# Patient Record
Sex: Female | Born: 1975 | Race: Asian | Hispanic: No | Marital: Married
Health system: Midwestern US, Community
[De-identification: ages and names within clinical notes are randomized; demographics above are authoritative.]

## PROBLEM LIST (undated history)

## (undated) ENCOUNTER — Emergency Department (HOSPITAL_COMMUNITY): Admission: EM | Payer: BLUE CROSS/BLUE SHIELD | Source: Home / Self Care

## (undated) DIAGNOSIS — A159 Respiratory tuberculosis unspecified: Secondary | ICD-10-CM

## (undated) DIAGNOSIS — I10 Essential (primary) hypertension: Secondary | ICD-10-CM

## (undated) DIAGNOSIS — E782 Mixed hyperlipidemia: Secondary | ICD-10-CM

## (undated) DIAGNOSIS — E1142 Type 2 diabetes mellitus with diabetic polyneuropathy: Principal | ICD-10-CM

## (undated) DIAGNOSIS — M5412 Radiculopathy, cervical region: Secondary | ICD-10-CM

## (undated) DIAGNOSIS — M542 Cervicalgia: Secondary | ICD-10-CM

## (undated) DIAGNOSIS — E119 Type 2 diabetes mellitus without complications: Principal | ICD-10-CM

## (undated) DIAGNOSIS — Z0000 Encounter for general adult medical examination without abnormal findings: Principal | ICD-10-CM

## (undated) DIAGNOSIS — N83202 Unspecified ovarian cyst, left side: Secondary | ICD-10-CM

## (undated) DIAGNOSIS — M4802 Spinal stenosis, cervical region: Secondary | ICD-10-CM

## (undated) DIAGNOSIS — R7309 Other abnormal glucose: Secondary | ICD-10-CM

## (undated) DIAGNOSIS — M25512 Pain in left shoulder: Secondary | ICD-10-CM

## (undated) DIAGNOSIS — Z1231 Encounter for screening mammogram for malignant neoplasm of breast: Secondary | ICD-10-CM

## (undated) DIAGNOSIS — M25511 Pain in right shoulder: Secondary | ICD-10-CM

## (undated) DIAGNOSIS — E875 Hyperkalemia: Principal | ICD-10-CM

---

## 2010-10-19 ENCOUNTER — Other Ambulatory Visit: Payer: Self-pay | Admitting: Infectious Diseases

## 2010-10-19 ENCOUNTER — Ambulatory Visit
Admission: RE | Admit: 2010-10-19 | Discharge: 2010-10-19 | Disposition: A | Discharge status: Home / Self Care | Payer: No Typology Code available for payment source | Source: Ambulatory Visit | Attending: Infectious Diseases | Admitting: Infectious Diseases

## 2010-10-19 DIAGNOSIS — R7611 Nonspecific reaction to tuberculin skin test without active tuberculosis: Secondary | ICD-10-CM

## 2011-11-26 ENCOUNTER — Emergency Department (INDEPENDENT_AMBULATORY_CARE_PROVIDER_SITE_OTHER): Payer: Medicaid Other

## 2011-11-26 ENCOUNTER — Encounter (HOSPITAL_COMMUNITY): Payer: Self-pay | Admitting: Emergency Medicine

## 2011-11-26 ENCOUNTER — Emergency Department (HOSPITAL_COMMUNITY)
Admission: EM | Admit: 2011-11-26 | Discharge: 2011-11-26 | Disposition: A | Discharge status: Home / Self Care | Payer: Medicaid Other | Source: Home / Self Care | Attending: Emergency Medicine | Admitting: Emergency Medicine

## 2011-11-26 DIAGNOSIS — S60459A Superficial foreign body of unspecified finger, initial encounter: Secondary | ICD-10-CM

## 2011-11-26 NOTE — ED Notes (Signed)
Pt here with c/o right hand index finger scabbed blister with pain after cleaning fish x 2 weeks ago.swelling noted but denies numb/tingling according to Health and safety inspector for [employer].no drainage or redness seen

## 2011-11-26 NOTE — Discharge Instructions (Signed)
Go to Dr. Carlos Levering office now. He is expecting you. This will require surgical removal which can do in his office. He will put you in the right antibiotics and pain medicine if necessary.,

## 2011-11-26 NOTE — ED Provider Notes (Signed)
History     CSN: [id number]  Arrival date & time 11/26/11  1116   First MD Initiated Contact with Patient 11/26/11 1155      Chief Complaint  Patient presents with  . Rash  . Hand Pain    (Consider location/radiation/quality/duration/timing/severity/associated sxs/prior treatment) HPI Comments: Patient is a right-handed female who reports cleaning fish, and a fishbone stuck her in the right index PIP. She reports pain, swelling at the joint, and has been squeezing on it repetitively and expressing some yellowish drainage. .No nausea, vomiting, redness extending up the finger. No limitation of motion. No pain with motion, paresthesias. She is not a smoker, and is not a diabetic. She has no previous history of injury to this hand.  Patient is a 36 y.o. female presenting with hand pain. The history is provided by the patient. The history is limited by a language barrier. 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  Hand Pain This is a new problem. The current episode started more than 1 week ago. The problem occurs constantly. The problem has not changed since onset.The symptoms are aggravated by nothing. The symptoms are relieved by nothing. She has tried nothing for the symptoms. The treatment provided no relief.    History reviewed. No pertinent past medical history.  History reviewed. No pertinent past surgical history.  History reviewed. No pertinent family history.  History  Substance Use Topics  . Smoking status: Never Smoker   . Smokeless tobacco: Not on file  . Alcohol Use: No    OB History    Grav Para Term Preterm Abortions TAB SAB Ect Mult Living                  Review of Systems  Constitutional: Negative for fever.  Gastrointestinal: Negative for nausea and vomiting.  Musculoskeletal: Positive for joint swelling.  Neurological: Negative for weakness and numbness.    Allergies  Review of patient's allergies indicates no known allergies.  Home  Medications  No current outpatient prescriptions on file.  BP 134/88  Pulse 71  Temp(Src) 98.6 F (37 C) (Oral)  Resp 16  SpO2 100%  LMP 11/20/2011  Physical Exam  Nursing note and vitals reviewed. Constitutional: She is oriented to person, place, and time. She appears well-developed and well-nourished. No distress.  HENT:  Head: Normocephalic and atraumatic.  Eyes: Conjunctivae and EOM are normal.  Neck: Normal range of motion.  Cardiovascular: Normal rate.   Pulmonary/Chest: Effort normal.  Abdominal: She exhibits no distension.  Musculoskeletal: Normal range of motion.       Hands:       Nontender Callus at right index DIP. No erythema, expressible purulent drainage. Patient able to flex/extend finger with full-strength. FDS/FDP 5/5 compared to other fingers. Sensation 2 point discrimination intact at 5 mm. Wrist of hand is within normal limits.  Neurological: She is alert and oriented to person, place, and time.  Skin: Skin is warm and dry.  Psychiatric: She has a normal mood and affect. Her behavior is normal. Judgment and thought content normal.    ED Course  Procedures (including critical care time)  Labs Reviewed - No data to display Dg Finger Index Right  11/26/2011  *RADIOLOGY REPORT*  Clinical Data: Fish bone stuck into right index finger while eating.  RIGHT INDEX FINGER 2+V  Comparison: None.  Findings: A fish bone is present within the soft tissues of the index finger dorsal to the middle phalanx.  No fracture.  IMPRESSION: Fish bone  within the soft tissues of the index finger as described.  Original Report Authenticated By: Donavan Burnet, M.D.     1. Foreign body in skin of finger    x-ray reviewed by myself. Foreign body present. see radiologist's note.   MDM  Obtaining x-ray of index finger to rule out retained foreign body.   Discussed with Dr. Amanda Pea. This will require I&D. I will send her over to his office now, as he is in clinic. He will see her in  the office and do the procedure today. He'll send her home with appropriate antibiotics and pain medications as needed. Discussed this with the patient using a language line. She agrees with plan.  Luiz Blare, MD 11/26/11 1440

## 2014-08-12 ENCOUNTER — Ambulatory Visit (INDEPENDENT_AMBULATORY_CARE_PROVIDER_SITE_OTHER): Payer: BLUE CROSS/BLUE SHIELD | Admitting: Obstetrics and Gynecology

## 2014-08-12 ENCOUNTER — Encounter: Payer: Self-pay | Admitting: Obstetrics and Gynecology

## 2014-08-12 VITALS — BP 144/89 | HR 77 | Ht 60.0 in | Wt 142.8 lb

## 2014-08-12 DIAGNOSIS — Z3009 Encounter for other general counseling and advice on contraception: Secondary | ICD-10-CM | POA: Diagnosis not present

## 2014-08-12 NOTE — Progress Notes (Signed)
Patient ID: Toni Jordan, female   DOB: 12/01/1975, 39 y.o.   MRN: [id number] 39 yo G4P4 presenting today requesting permanent sterilization. Patient was previously on depo-provera but is ready to move forward with a more permanent procedure  History reviewed. No pertinent past medical history. History reviewed. No pertinent past surgical history. History reviewed. No pertinent family history. History  Substance Use Topics  . Smoking status: Never Smoker   . Smokeless tobacco: Not on file  . Alcohol Use: No   GENERAL: Well-developed, well-nourished female in no acute distress.  ABDOMEN: Soft, nontender, nondistended. No organomegaly. EXTREMITIES: No cyanosis, clubbing, or edema, 2+ distal pulses.  A/P 39 yo with a desire of permanent sterilization - Risks, benefits and alternatives were explained including but not limited to risks of bleeding, infection and damage to adjacent organs. Patient verbalized understanding and all questions were answered. - The patient signed the Medicaid consent form today and will be scheduled for a laparoscopic bilateral salpingectomy 30 days from now. - All questions were answered

## 2014-09-23 NOTE — H&P (Signed)
Toni Jordan is a 39 y.o. female 264P4 with a desire for permanent sterilization who is here for a scheduled laparoscopic salpingectomy.  Pertinent Gynecological History: Menses: flow is light and regular every month without intermenstrual spotting Bleeding: normal Contraception: Depo-Provera injections DES exposure: denies Blood transfusions: none Sexually transmitted diseases: no past history Previous GYN Procedures: none  OB History: G4, P4   Menstrual History:  No LMP recorded.    Past Medical History  Diagnosis Date  . Vaginal delivery 2000,2005,1998,1995    Past Surgical History  Procedure Laterality Date  . No past surgeries      History reviewed. No pertinent family history.  Social History:  reports that she has never smoked. She does not have any smokeless tobacco history on file. She reports that she does not drink alcohol or use illicit drugs.  Allergies: No Known Allergies  No prescriptions prior to admission    Review of Systems  All other systems reviewed and are negative.   Blood pressure 132/85, pulse 66, temperature 97.5 F (36.4 C), resp. rate 18, height 4\' 10"  (1.473 m), weight 142 lb (64.411 kg), SpO2 99 %. Physical Exam  GENERAL: Well-developed, well-nourished female in no acute distress.  HEENT: Normocephalic, atraumatic. Sclerae anicteric.  NECK: Supple. Normal thyroid.  LUNGS: Clear to auscultation bilaterally.  HEART: Regular rate and rhythm. ABDOMEN: Soft, nontender, nondistended. No organomegaly. PELVIC: Deferred to OR EXTREMITIES: No cyanosis, clubbing, or edema, 2+ distal pulses.   Results for orders placed or performed during the hospital encounter of 09/24/14 (from the past 24 hour(s))  CBC     Status: Abnormal   Collection Time: 09/24/14 10:40 AM  Result Value Ref Range   WBC 11.9 (H) 4.0 - 10.5 K/uL   RBC 4.87 3.87 - 5.11 MIL/uL   Hemoglobin 14.2 12.0 - 15.0 g/dL   HCT 16.140.8 09.636.0 - 04.546.0 %   MCV 83.8 78.0 - 100.0 fL   MCH 29.2  26.0 - 34.0 pg   MCHC 34.8 30.0 - 36.0 g/dL   RDW 40.913.1 81.111.5 - 91.415.5 %   Platelets 288 150 - 400 K/uL    No results found.  Assessment/Plan: 39 yo G4P4 here for salpingectomy due to her desire for permanent sterilization - Risks, benefits and alternatives were explained including but not limited to risks of bleeding, infection and damage to adjacent organs. Patient verbalized understanding and all questions were answered. Consent signed for a laparoscopic bilateral salpingectomy  Brooklee Michelin 09/24/2014, 11:16 AM

## 2014-09-24 ENCOUNTER — Ambulatory Visit (HOSPITAL_COMMUNITY): Payer: BLUE CROSS/BLUE SHIELD | Admitting: Certified Registered Nurse Anesthetist

## 2014-09-24 ENCOUNTER — Encounter (HOSPITAL_COMMUNITY)
Admission: RE | Disposition: A | Discharge status: Home / Self Care | Payer: Self-pay | Source: Ambulatory Visit | Attending: Obstetrics and Gynecology

## 2014-09-24 ENCOUNTER — Ambulatory Visit (HOSPITAL_COMMUNITY)
Admission: RE | Admit: 2014-09-24 | Discharge: 2014-09-24 | Disposition: A | Discharge status: Home / Self Care | Payer: BLUE CROSS/BLUE SHIELD | Source: Ambulatory Visit | Attending: Obstetrics and Gynecology | Admitting: Obstetrics and Gynecology

## 2014-09-24 ENCOUNTER — Encounter: Payer: Self-pay | Admitting: *Deleted

## 2014-09-24 ENCOUNTER — Encounter (HOSPITAL_COMMUNITY): Payer: Self-pay | Admitting: *Deleted

## 2014-09-24 DIAGNOSIS — Z302 Encounter for sterilization: Secondary | ICD-10-CM | POA: Insufficient documentation

## 2014-09-24 HISTORY — PX: LAPAROSCOPIC BILATERAL SALPINGECTOMY: SHX5889

## 2014-09-24 LAB — CBC
HCT: 40.8 % (ref 36.0–46.0)
Hemoglobin: 14.2 g/dL (ref 12.0–15.0)
MCH: 29.2 pg (ref 26.0–34.0)
MCHC: 34.8 g/dL (ref 30.0–36.0)
MCV: 83.8 fL (ref 78.0–100.0)
Platelets: 288 10*3/uL (ref 150–400)
RBC: 4.87 MIL/uL (ref 3.87–5.11)
RDW: 13.1 % (ref 11.5–15.5)
WBC: 11.9 10*3/uL — AB (ref 4.0–10.5)

## 2014-09-24 LAB — PREGNANCY, URINE: PREG TEST UR: NEGATIVE

## 2014-09-24 SURGERY — SALPINGECTOMY, BILATERAL, LAPAROSCOPIC
Anesthesia: General | Site: Abdomen | Laterality: Bilateral

## 2014-09-24 MED ORDER — SUCCINYLCHOLINE CHLORIDE 20 MG/ML IJ SOLN
INTRAMUSCULAR | Status: DC | PRN
  Administered 2014-09-24: 120 mg via INTRAVENOUS

## 2014-09-24 MED ORDER — DEXAMETHASONE SODIUM PHOSPHATE 4 MG/ML IJ SOLN
INTRAMUSCULAR | Status: AC
  Filled 2014-09-24: qty 1

## 2014-09-24 MED ORDER — SCOPOLAMINE 1 MG/3DAYS TD PT72
1.0000 | MEDICATED_PATCH | Freq: Once | TRANSDERMAL | Status: DC
  Administered 2014-09-24: 1.5 mg via TRANSDERMAL

## 2014-09-24 MED ORDER — FENTANYL CITRATE 0.05 MG/ML IJ SOLN
25.0000 ug | INTRAMUSCULAR | Status: DC | PRN
  Administered 2014-09-24 (×2): 50 ug via INTRAVENOUS

## 2014-09-24 MED ORDER — IBUPROFEN 600 MG PO TABS: 600.0000 mg | ORAL_TABLET | Freq: Four times a day (QID) | ORAL | Status: DC | PRN

## 2014-09-24 MED ORDER — OXYCODONE-ACETAMINOPHEN 5-325 MG PO TABS: 1.0000 | ORAL_TABLET | ORAL | Status: DC | PRN

## 2014-09-24 MED ORDER — LIDOCAINE HCL (CARDIAC) 20 MG/ML IV SOLN
INTRAVENOUS | Status: AC
  Filled 2014-09-24: qty 5

## 2014-09-24 MED ORDER — FENTANYL CITRATE 0.05 MG/ML IJ SOLN
INTRAMUSCULAR | Status: DC | PRN
  Administered 2014-09-24: 150 ug via INTRAVENOUS
  Administered 2014-09-24 (×2): 50 ug via INTRAVENOUS

## 2014-09-24 MED ORDER — ONDANSETRON HCL 4 MG/2ML IJ SOLN: 4.0000 mg | Freq: Once | INTRAMUSCULAR | Status: DC | PRN

## 2014-09-24 MED ORDER — GLYCOPYRROLATE 0.2 MG/ML IJ SOLN
INTRAMUSCULAR | Status: DC | PRN
  Administered 2014-09-24: 0.4 mg via INTRAVENOUS

## 2014-09-24 MED ORDER — KETOROLAC TROMETHAMINE 30 MG/ML IJ SOLN
INTRAMUSCULAR | Status: AC
Start: 2014-09-24 — End: 2014-09-24
  Filled 2014-09-24: qty 1

## 2014-09-24 MED ORDER — PROPOFOL 10 MG/ML IV BOLUS
INTRAVENOUS | Status: AC
  Filled 2014-09-24: qty 20

## 2014-09-24 MED ORDER — GLYCOPYRROLATE 0.2 MG/ML IJ SOLN
INTRAMUSCULAR | Status: AC
  Filled 2014-09-24: qty 3

## 2014-09-24 MED ORDER — MIDAZOLAM HCL 2 MG/2ML IJ SOLN
INTRAMUSCULAR | Status: AC
  Filled 2014-09-24: qty 2

## 2014-09-24 MED ORDER — ONDANSETRON HCL 4 MG/2ML IJ SOLN
INTRAMUSCULAR | Status: DC | PRN
  Administered 2014-09-24: 4 mg via INTRAVENOUS

## 2014-09-24 MED ORDER — NEOSTIGMINE METHYLSULFATE 10 MG/10ML IV SOLN
INTRAVENOUS | Status: DC | PRN
  Administered 2014-09-24: 3 mg via INTRAVENOUS

## 2014-09-24 MED ORDER — ROCURONIUM BROMIDE 100 MG/10ML IV SOLN
INTRAVENOUS | Status: AC
  Filled 2014-09-24: qty 1

## 2014-09-24 MED ORDER — LACTATED RINGERS IV SOLN
INTRAVENOUS | Status: DC
  Administered 2014-09-24 (×2): via INTRAVENOUS

## 2014-09-24 MED ORDER — SUCCINYLCHOLINE CHLORIDE 20 MG/ML IJ SOLN
INTRAMUSCULAR | Status: AC
  Filled 2014-09-24: qty 1

## 2014-09-24 MED ORDER — KETOROLAC TROMETHAMINE 30 MG/ML IJ SOLN
INTRAMUSCULAR | Status: DC | PRN
  Administered 2014-09-24: 30 mg via INTRAVENOUS

## 2014-09-24 MED ORDER — ONDANSETRON HCL 4 MG/2ML IJ SOLN
INTRAMUSCULAR | Status: AC
  Filled 2014-09-24: qty 2

## 2014-09-24 MED ORDER — SCOPOLAMINE 1 MG/3DAYS TD PT72
MEDICATED_PATCH | TRANSDERMAL | Status: AC
  Administered 2014-09-24: 1.5 mg via TRANSDERMAL
  Filled 2014-09-24: qty 1

## 2014-09-24 MED ORDER — MIDAZOLAM HCL 2 MG/2ML IJ SOLN
INTRAMUSCULAR | Status: DC | PRN
Start: 2014-09-24 — End: 2014-09-24
  Administered 2014-09-24: 2 mg via INTRAVENOUS

## 2014-09-24 MED ORDER — BUPIVACAINE HCL (PF) 0.5 % IJ SOLN
INTRAMUSCULAR | Status: AC
  Filled 2014-09-24: qty 30

## 2014-09-24 MED ORDER — MEPERIDINE HCL 25 MG/ML IJ SOLN: 6.2500 mg | INTRAMUSCULAR | Status: DC | PRN

## 2014-09-24 MED ORDER — FENTANYL CITRATE 0.05 MG/ML IJ SOLN
INTRAMUSCULAR | Status: AC
  Filled 2014-09-24: qty 5

## 2014-09-24 MED ORDER — ROCURONIUM BROMIDE 100 MG/10ML IV SOLN
INTRAVENOUS | Status: DC | PRN
  Administered 2014-09-24: 10 mg via INTRAVENOUS

## 2014-09-24 MED ORDER — PROPOFOL 10 MG/ML IV BOLUS
INTRAVENOUS | Status: DC | PRN
  Administered 2014-09-24: 160 mg via INTRAVENOUS
  Administered 2014-09-24: 30 mg via INTRAVENOUS

## 2014-09-24 MED ORDER — NEOSTIGMINE METHYLSULFATE 10 MG/10ML IV SOLN
INTRAVENOUS | Status: AC
  Filled 2014-09-24: qty 1

## 2014-09-24 MED ORDER — FENTANYL CITRATE 0.05 MG/ML IJ SOLN
INTRAMUSCULAR | Status: AC
Start: 2014-09-24 — End: 2014-09-24
  Administered 2014-09-24: 50 ug via INTRAVENOUS
  Filled 2014-09-24: qty 2

## 2014-09-24 MED ORDER — BUPIVACAINE HCL (PF) 0.25 % IJ SOLN
INTRAMUSCULAR | Status: DC | PRN
  Administered 2014-09-24: 10 mL

## 2014-09-24 MED ORDER — DOCUSATE SODIUM 100 MG PO CAPS: 100.0000 mg | ORAL_CAPSULE | Freq: Two times a day (BID) | ORAL | Status: DC | PRN

## 2014-09-24 MED ORDER — DEXAMETHASONE SODIUM PHOSPHATE 10 MG/ML IJ SOLN
INTRAMUSCULAR | Status: DC | PRN
Start: 2014-09-24 — End: 2014-09-24
  Administered 2014-09-24: 4 mg via INTRAVENOUS

## 2014-09-24 MED ORDER — LIDOCAINE HCL (CARDIAC) 20 MG/ML IV SOLN
INTRAVENOUS | Status: DC | PRN
  Administered 2014-09-24: 50 mg via INTRAVENOUS

## 2014-09-24 MED ORDER — BUPIVACAINE HCL (PF) 0.25 % IJ SOLN
INTRAMUSCULAR | Status: AC
  Filled 2014-09-24: qty 30

## 2014-09-24 SURGICAL SUPPLY — 28 items
CHLORAPREP W/TINT 26ML (MISCELLANEOUS) ×3 IMPLANT
CLIP FILSHIE TUBAL LIGA STRL (Clip) ×6 IMPLANT
DRSG COVADERM PLUS 2X2 (GAUZE/BANDAGES/DRESSINGS) IMPLANT
DRSG OPSITE POSTOP 3X4 (GAUZE/BANDAGES/DRESSINGS) ×3 IMPLANT
ELECT REM PT RETURN 9FT ADLT (ELECTROSURGICAL)
ELECTRODE REM PT RTRN 9FT ADLT (ELECTROSURGICAL) IMPLANT
GLOVE BIOGEL PI IND STRL 6.5 (GLOVE) ×1 IMPLANT
GLOVE BIOGEL PI INDICATOR 6.5 (GLOVE) ×2
GLOVE SURG SS PI 6.0 STRL IVOR (GLOVE) ×3 IMPLANT
GOWN STRL REUS W/TWL LRG LVL3 (GOWN DISPOSABLE) ×6 IMPLANT
LIQUID BAND (GAUZE/BANDAGES/DRESSINGS) IMPLANT
NS IRRIG 1000ML POUR BTL (IV SOLUTION) ×3 IMPLANT
PACK LAPAROSCOPY BASIN (CUSTOM PROCEDURE TRAY) ×3 IMPLANT
PAD POSITIONER PINK NONSTERILE (MISCELLANEOUS) ×3 IMPLANT
POUCH SPECIMEN RETRIEVAL 10MM (ENDOMECHANICALS) IMPLANT
PROTECTOR NERVE ULNAR (MISCELLANEOUS) ×3 IMPLANT
SEALER TISSUE G2 CVD JAW 35 (ENDOMECHANICALS) IMPLANT
SEALER TISSUE G2 CVD JAW 45CM (ENDOMECHANICALS)
SET IRRIG TUBING LAPAROSCOPIC (IRRIGATION / IRRIGATOR) IMPLANT
SHEARS HARMONIC ACE PLUS 36CM (ENDOMECHANICALS) IMPLANT
SUT MNCRL AB 4-0 PS2 18 (SUTURE) IMPLANT
SUT VIC AB 3-0 PS2 18 (SUTURE) ×3 IMPLANT
SUT VICRYL 0 UR6 27IN ABS (SUTURE) ×6 IMPLANT
TOWEL OR 17X24 6PK STRL BLUE (TOWEL DISPOSABLE) ×6 IMPLANT
TRAY FOLEY CATH SILVER 14FR (SET/KITS/TRAYS/PACK) ×3 IMPLANT
TROCAR BALLN 12MMX100 BLUNT (TROCAR) ×3 IMPLANT
TROCAR XCEL NON-BLD 5MMX100MML (ENDOMECHANICALS) IMPLANT
WATER STERILE IRR 1000ML POUR (IV SOLUTION) ×3 IMPLANT

## 2014-09-24 NOTE — Anesthesia Procedure Notes (Signed)
Procedure Name: Intubation Date/Time: 09/24/2014 11:54 AM Performed by: Elgie CongoMALINOVA, Anabela Crayton H Pre-anesthesia Checklist: Patient identified, Emergency Drugs available, Suction available, Patient being monitored and Timeout performed Patient Re-evaluated:Patient Re-evaluated prior to inductionOxygen Delivery Method: Circle system utilized Preoxygenation: Pre-oxygenation with 100% oxygen Intubation Type: IV induction Ventilation: Mask ventilation without difficulty Laryngoscope Size: Miller and 2 Grade View: Grade I Tube type: Oral Tube size: 7.0 mm Number of attempts: 1 Airway Equipment and Method: Stylet Placement Confirmation: ETT inserted through vocal cords under direct vision,  positive ETCO2 and breath sounds checked- equal and bilateral Secured at: 20 cm Tube secured with: Tape Dental Injury: Teeth and Oropharynx as per pre-operative assessment

## 2014-09-24 NOTE — Transfer of Care (Signed)
Immediate Anesthesia Transfer of Care Note  Patient: Toni Jordan  Procedure(s) Performed: Procedure(s): LAPAROSCOPIC BILATERAL TUBAL LIGATION (Bilateral)  Patient Location: PACU  Anesthesia Type:General  Level of Consciousness: awake, alert  and oriented  Airway & Oxygen Therapy: Patient Spontanous Breathing and Patient connected to nasal cannula oxygen  Post-op Assessment: Report given to RN, Post -op Vital signs reviewed and stable and Patient moving all extremities  Post vital signs: Reviewed and stable  Last Vitals:  Filed Vitals:   09/24/14 1054  BP: 132/85  Pulse: 66  Temp: 36.4 C  Resp: 18    Complications: No apparent anesthesia complications

## 2014-09-24 NOTE — Anesthesia Postprocedure Evaluation (Signed)
  Anesthesia Post-op Note  Patient: Toni Jordan  Procedure(s) Performed: Procedure(s) (LRB): LAPAROSCOPIC BILATERAL TUBAL LIGATION (Bilateral)  Patient Location: PACU  Anesthesia Type: General  Level of Consciousness: awake and alert   Airway and Oxygen Therapy: Patient Spontanous Breathing  Post-op Pain: mild  Post-op Assessment: Post-op Vital signs reviewed, Patient's Cardiovascular Status Stable, Respiratory Function Stable, Patent Airway and No signs of Nausea or vomiting  Last Vitals:  Filed Vitals:   09/24/14 1400  BP: 129/77  Pulse: 71  Temp: 37.1 C  Resp: 17    Post-op Vital Signs: stable   Complications: No apparent anesthesia complications

## 2014-09-24 NOTE — Discharge Instructions (Signed)

## 2014-09-24 NOTE — Anesthesia Preprocedure Evaluation (Signed)
Anesthesia Evaluation  Patient identified by MRN, date of birth, ID band Patient awake    Reviewed: Allergy & Precautions, H&P , NPO status , Patient's Chart, lab work & pertinent test results  Airway Mallampati: II TM Distance: >3 FB Neck ROM: full    Dental no notable dental hx. (+) Teeth Intact   Pulmonary neg pulmonary ROS,    Pulmonary exam normal       Cardiovascular negative cardio ROS      Neuro/Psych negative neurological ROS  negative psych ROS   GI/Hepatic negative GI ROS, Neg liver ROS,   Endo/Other  negative endocrine ROS  Renal/GU negative Renal ROS     Musculoskeletal   Abdominal Normal abdominal exam  (+)   Peds  Hematology negative hematology ROS (+)   Anesthesia Other Findings   Reproductive/Obstetrics negative OB ROS                           Anesthesia Physical Anesthesia Plan  ASA: II  Anesthesia Plan: General   Post-op Pain Management:    Induction: Intravenous  Airway Management Planned: Oral ETT  Additional Equipment:   Intra-op Plan:   Post-operative Plan: Extubation in OR  Informed Consent: I have reviewed the patients History and Physical, chart, labs and discussed the procedure including the risks, benefits and alternatives for the proposed anesthesia with the patient or authorized representative who has indicated his/her understanding and acceptance.   Dental Advisory Given  Plan Discussed with: CRNA and Surgeon  Anesthesia Plan Comments:         Anesthesia Quick Evaluation  

## 2014-09-24 NOTE — Op Note (Signed)
Toni Jordan 09/24/2014  PREOPERATIVE DIAGNOSIS:  Undesired fertility  POSTOPERATIVE DIAGNOSIS:  Undesired fertility  PROCEDURE:  Laparoscopic Bilateral Tubal Sterilization using Filshie Clips   SURGEON: Dr. Catalina AntiguaPeggy Shivaun Bilello  ANESTHESIA:  General endotracheal  COMPLICATIONS:  None immediate.  ESTIMATED BLOOD LOSS:  Less than 50 ml.  FLUIDS: 2200 ml LR.  URINE OUTPUT:  100 ml of clear urine.  INDICATIONS: 39 y.o. No obstetric history on file.  with undesired fertility, desires permanent sterilization. Other reversible forms of contraception were discussed with patient; she declines all other modalities.  Risks of procedure discussed with patient including permanence of method, bleeding, infection, injury to surrounding organs and need for additional procedures including laparotomy, risk of regret.  Failure risk of 0.5-1% with increased risk of ectopic gestation if pregnancy occurs was also discussed with patient.      FINDINGS:  Normal uterus, tubes, and ovaries.  TECHNIQUE:  The patient was taken to the operating room where general anesthesia was obtained without difficulty.  She was then placed in the dorsal lithotomy position and prepared and draped in sterile fashion.  After an adequate timeout was performed, a bivalved speculum was then placed in the patient's vagina, and the anterior lip of cervix grasped with the single-tooth tenaculum.  The uterine manipulator was then advanced into the uterus.  The speculum was removed from the vagina.  Attention was then turned to the patient's abdomen where a 10-mm skin incision was made on the umbilical fold.  The underlying fascia was identified, grasped with Kocher clamps, tented up and entered sharply with mayo scissors. The fascia was tagged with 0-Vicryl. The peritoneum was entered bluntly.The 11 mm trocar and sleeve were introduced into the abdominal cavityl.  Intraperitoneal placement was confirmed with the use of the laparoscope.  Pneumoperitoneum was achieved by the insufflation of CO2 gas. A survey of the patient's pelvis and abdomen revealed entirely normal anatomy.  The fallopian tubes were observed and found to be normal in appearance. The Filshie clip applicator was placed through the operative port, and a Filshie clip was placed on the right fallopian tube ,about 2 cm from the cornual attachment, with care given to incorporate the underlying mesosalpinx.  A similar process was carried out on the contralateral side allowing for bilateral tubal sterilization.   Good hemostasis was noted overall. The instruments were then removed from the patient's abdomen and the fascial incision was repaired with 0 Vicryl, and the skin was closed with 4-0 Vicryl. The uterine manipulator and the tenaculum were removed from the vagina without complications. The patient tolerated the procedure well.  Sponge, lap, and needle counts were correct times two.  The patient was then taken to the recovery room awake, extubated and in stable  in stable condition.

## 2014-09-25 ENCOUNTER — Encounter (HOSPITAL_COMMUNITY): Payer: Self-pay | Admitting: Obstetrics and Gynecology

## 2014-10-04 ENCOUNTER — Encounter: Payer: Self-pay | Admitting: Obstetrics and Gynecology

## 2014-10-04 ENCOUNTER — Ambulatory Visit (INDEPENDENT_AMBULATORY_CARE_PROVIDER_SITE_OTHER): Payer: BLUE CROSS/BLUE SHIELD | Admitting: Obstetrics and Gynecology

## 2014-10-04 VITALS — BP 126/71 | HR 61 | Temp 97.5°F | Wt 140.3 lb

## 2014-10-04 DIAGNOSIS — Z1231 Encounter for screening mammogram for malignant neoplasm of breast: Secondary | ICD-10-CM

## 2014-10-04 DIAGNOSIS — Z9889 Other specified postprocedural states: Secondary | ICD-10-CM

## 2014-10-04 NOTE — Progress Notes (Signed)
Here for post op visit. Used [employer] [phone]. States has ran out of medicines, not sure of names. Only taking the one she takes twice a day.

## 2014-10-04 NOTE — Progress Notes (Signed)
Patient ID: Toni Jordan, female   DOB: 04/27/1976, 39 y.o.   MRN: [id number] 39 yo here for a post op check s/p laparoscopic bilateral tubal ligation on 09/24/2014. Patient reports feeling well since hospital discharge. She denies any pain, fever, abnormal drainage from her incision. She returned to work 2 days ago  Past Medical History  Diagnosis Date  . Vaginal delivery 2000,2005,1998,1995   Past Surgical History  Procedure Laterality Date  . No past surgeries    . Laparoscopic bilateral salpingectomy Bilateral 09/24/2014    Procedure: LAPAROSCOPIC BILATERAL TUBAL LIGATION;  Surgeon: Catalina AntiguaPeggy Tryston Gilliam, MD;  Location: [facility];  Service: Gynecology;  Laterality: Bilateral;   No family history on file. History  Substance Use Topics  . Smoking status: Never Smoker   . Smokeless tobacco: Never Used  . Alcohol Use: No   GENERAL: Well-developed, well-nourished female in no acute distress.  ABDOMEN: Soft, nontender, nondistended. No organomegaly. Incision: umbilical incision healing well. No erythema, induration or drainage EXTREMITIES: No cyanosis, clubbing, or edema, 2+ distal pulses.  A/P 39 yo here for post op check  - Incision healing well - Patient is medically cleared to resume all activities - A mammogram was scheduled for her - rtc prn

## 2014-10-11 ENCOUNTER — Ambulatory Visit (HOSPITAL_COMMUNITY)
Admission: RE | Admit: 2014-10-11 | Discharge: 2014-10-11 | Disposition: A | Discharge status: Home / Self Care | Payer: BLUE CROSS/BLUE SHIELD | Source: Ambulatory Visit | Attending: Obstetrics and Gynecology | Admitting: Obstetrics and Gynecology

## 2014-10-11 DIAGNOSIS — Z1231 Encounter for screening mammogram for malignant neoplasm of breast: Secondary | ICD-10-CM | POA: Diagnosis present

## 2015-07-26 ENCOUNTER — Emergency Department (HOSPITAL_COMMUNITY)
Admission: EM | Admit: 2015-07-26 | Discharge: 2015-07-26 | Disposition: A | Discharge status: Home / Self Care | Payer: BLUE CROSS/BLUE SHIELD | Attending: Emergency Medicine | Admitting: Emergency Medicine

## 2015-07-26 ENCOUNTER — Encounter (HOSPITAL_COMMUNITY): Payer: Self-pay | Admitting: Nurse Practitioner

## 2015-07-26 DIAGNOSIS — M79604 Pain in right leg: Secondary | ICD-10-CM | POA: Diagnosis present

## 2015-07-26 DIAGNOSIS — M5431 Sciatica, right side: Secondary | ICD-10-CM | POA: Diagnosis not present

## 2015-07-26 DIAGNOSIS — R200 Anesthesia of skin: Secondary | ICD-10-CM | POA: Insufficient documentation

## 2015-07-26 MED ORDER — NAPROXEN 250 MG PO TABS
500.0000 mg | ORAL_TABLET | Freq: Once | ORAL | Status: AC
  Administered 2015-07-26: 500 mg via ORAL
  Filled 2015-07-26: qty 2

## 2015-07-26 MED ORDER — HYDROCODONE-ACETAMINOPHEN 5-325 MG PO TABS: 2.0000 | ORAL_TABLET | ORAL | Status: DC | PRN

## 2015-07-26 MED ORDER — NAPROXEN 500 MG PO TABS: 500.0000 mg | ORAL_TABLET | Freq: Two times a day (BID) | ORAL | Status: DC

## 2015-07-26 NOTE — ED Provider Notes (Signed)
CSN: [id number]     Arrival date & time 07/26/15  1522 History  By signing my name below, I, Evon Slack, attest that this documentation has been prepared under the direction and in the presence of [employer], PA-C. Electronically Signed: Evon Slack, ED Scribe. 07/26/2015. 4:25 PM.      Chief Complaint  Patient presents with  . Leg Pain   Patient is a 40 y.o. female presenting with leg pain. The history is provided by the patient. A language interpreter was used.  Leg Pain Associated symptoms: no back pain    HPI Comments: Toni Jordan is a 40 y.o. female who presents to the Emergency Department complaining of cramping right leg pain onset 2 week prior. Pt states that she has associated numbness radiating from the right buttock down to the right pinky toe. She states that the pain is worse when siting down. Pt denies any medications PTA. She reports that she had back pain previous to the leg pain that has since resolved. Denies any recent heavy lifting. Denies current back pain, bowel/bladder incontinence or retention. Denies Hx of fever, night sweats, lower extremity weakness, IV drug use, prednisone use, or personal Hx of cancer. Denies HX of DM.    Past Medical History  Diagnosis Date  . Vaginal delivery 2000,2005,1998,1995   Past Surgical History  Procedure Laterality Date  . No past surgeries    . Laparoscopic bilateral salpingectomy Bilateral 09/24/2014    Procedure: LAPAROSCOPIC BILATERAL TUBAL LIGATION;  Surgeon: Catalina Antigua, MD;  Location: [facility];  Service: Gynecology;  Laterality: Bilateral;   History reviewed. No pertinent family history. Social History  Substance Use Topics  . Smoking status: Never Smoker   . Smokeless tobacco: Never Used  . Alcohol Use: No   OB History    No data available      Review of Systems  Genitourinary: Negative.   Musculoskeletal: Positive for myalgias. Negative for back pain.  Neurological: Positive for numbness.      Allergies  Review of patient's allergies indicates no known allergies.  Home Medications   Prior to Admission medications   Medication Sig Start Date End Date Taking? Authorizing Provider  docusate sodium (COLACE) 100 MG capsule Take 1 capsule (100 mg total) by mouth 2 (two) times daily as needed. 09/24/14   Peggy Constant, MD  HYDROcodone-acetaminophen (NORCO/VICODIN) 5-325 MG tablet Take 2 tablets by mouth every 4 (four) hours as needed. 07/26/15   Exavier Lina Patel-Mills, PA-C  ibuprofen (ADVIL,MOTRIN) 600 MG tablet Take 1 tablet (600 mg total) by mouth every 6 (six) hours as needed. Patient not taking: Reported on 10/04/2014 09/24/14   Catalina Antigua, MD  naproxen (NAPROSYN) 500 MG tablet Take 1 tablet (500 mg total) by mouth 2 (two) times daily. 07/26/15   Jericho Cieslik Patel-Mills, PA-C  oxyCODONE-acetaminophen (PERCOCET/ROXICET) 5-325 MG per tablet Take 1 tablet by mouth every 4 (four) hours as needed. Patient not taking: Reported on 10/04/2014 09/24/14   Peggy Constant, MD   BP 150/79 mmHg  Pulse 72  Temp(Src) 98.4 F (36.9 C) (Oral)  Resp 17  Wt 66.271 kg  SpO2 98%   Physical Exam  Constitutional: She is oriented to person, place, and time. She appears well-developed and well-nourished. No distress.  HENT:  Head: Normocephalic and atraumatic.  Eyes: Conjunctivae and EOM are normal.  Neck: Neck supple. No tracheal deviation present.  Cardiovascular: Normal rate.   Pulmonary/Chest: Effort normal. No respiratory distress.  Musculoskeletal: Normal range of motion.  No lower extremity weakness.  No saddle anesthesia. Able to flex and extend toes. Able to dorsi and plantar flex. No midline thoracic or lumbar vertebral or paravertebral tenderness. Ambulatory.  Neurological: She is alert and oriented to person, place, and time.  Skin: Skin is warm and dry.  Psychiatric: She has a normal mood and affect. Her behavior is normal.  Nursing note and vitals reviewed.   ED Course  Procedures  (including critical care time) DIAGNOSTIC STUDIES: Oxygen Saturation is 98% on RA, normal by my interpretation.    COORDINATION OF CARE: 4:26 PM-Discussed treatment plan with pt at bedside and pt agreed to plan.   Labs Review Labs Reviewed - No data to display  Imaging Review No results found.    EKG Interpretation None      MDM   Final diagnoses:  Sciatica of right side   Patient with back pain.  No neurological deficits and normal neuro exam.  Patient is ambulatory.  No loss of bowel or bladder control.  No concern for cauda equina.  No fever, night sweats, weight loss, h/o cancer, IVDA, no recent procedure to back. No urinary symptoms suggestive of UTI.  Supportive care and return precaution discussed. Appears safe for discharge at this time. Follow up as indicated in discharge paperwork. Patient was given naproxen and hydrocodone. He was given follow up using the resource guide. I also explained that she should wear proper shoes. She is wearing flat wooden shoes. I discussed not using narcotic pain medication were working or driving and that she should use these prior to going to sleep. Patient in agreement with plan. Medications  naproxen (NAPROSYN) tablet 500 mg (500 mg Oral Given 07/26/15 1637)   I personally performed the services described in this documentation, which was scribed in my presence. The recorded information has been reviewed and is accurate.      Catha Gosselin, PA-C 07/26/15 1915  Gwyneth Sprout, MD 07/28/15 [phone]

## 2015-07-26 NOTE — ED Notes (Signed)
She c/o pain and numbness in R leg from R buttock to R foot 2 weeks. She felt it was related to frequent movements at work but pain has persisted. She reports pain is worse when sleeping at night and with movement. She has tried nothing for the pain. Skin w/d, skin color wnl. She also c/o numbness to tips of fingers in both hands while sleeping for the past 2-3 night that resolves after waking and massaging her fingers.

## 2015-07-26 NOTE — ED Notes (Signed)
Declined W/C at D/C and was escorted to lobby by RN. 

## 2015-07-26 NOTE — Discharge Instructions (Signed)
Sciatica Take naproxen for pain. Take hydrocodone for breakthrough pain. Follow up with a primary care provider using the resource guide below. Sciatica is pain, weakness, numbness, or tingling along your sciatic nerve. The nerve starts in the lower back and runs down the back of each leg. Nerve damage or certain conditions pinch or put pressure on the sciatic nerve. This causes the pain, weakness, and other discomforts of sciatica. HOME CARE   Only take medicine as told by your doctor.  Apply ice to the affected area for 20 minutes. Do this 3-4 times a day for the first 48-72 hours. Then try heat in the same way.  Exercise, stretch, or do your usual activities if these do not make your pain worse.  Go to physical therapy as told by your doctor.  Keep all doctor visits as told.  Do not wear high heels or shoes that are not supportive.  Get a firm mattress if your mattress is too soft to lessen pain and discomfort. GET HELP RIGHT AWAY IF:   You cannot control when you poop (bowel movement) or pee (urinate).  You have more weakness in your lower back, lower belly (pelvis), butt (buttocks), or legs.  You have redness or puffiness (swelling) of your back.  You have a burning feeling when you pee.  You have pain that gets worse when you lie down.  You have pain that wakes you from your sleep.  Your pain is worse than past pain.  Your pain lasts longer than 4 weeks.  You are suddenly losing weight without reason. MAKE SURE YOU:   Understand these instructions.  Will watch this condition.  Will get help right away if you are not doing well or get worse.   This information is not intended to replace advice given to you by your health care provider. Make sure you discuss any questions you have with your health care provider.   Document Released: 03/16/2008 Document Revised: 02/26/2015 Document Reviewed: 10/17/2011 Elsevier Interactive Patient Education 2016 [employer].  Emergency Department Resource Guide 1) Find a Doctor and Pay Out of Pocket Although you won't have to find out who is covered by your insurance plan, it is a good idea to ask around and get recommendations. You will then need to call the office and see if the doctor you have chosen will accept you as a new patient and what types of options they offer for patients who are self-pay. Some doctors offer discounts or will set up payment plans for their patients who do not have insurance, but you will need to ask so you aren't surprised when you get to your appointment.  2) Contact Your Local Health Department Not all health departments have doctors that can see patients for sick visits, but many do, so it is worth a call to see if yours does. If you don't know where your local health department is, you can check in your phone book. The CDC also has a tool to help you locate your state's health department, and many state websites also have listings of all of their local health departments.  3) Find a Walk-in Clinic If your illness is not likely to be very severe or complicated, you may want to try a walk in clinic. These are popping up all over the country in pharmacies, drugstores, and shopping centers. They're usually staffed by nurse practitioners or physician assistants that have been trained to treat common illnesses and complaints. They're usually fairly quick and  inexpensive. However, if you have serious medical issues or chronic medical problems, these are probably not your best option.  No Primary Care Doctor: - Call Health Connect at  [phone] - they can help you locate a primary care doctor that  accepts your insurance, provides certain services, etc. - Physician Referral Service- [phone]  Chronic Pain Problems: Organization         Address  Phone   Notes  [facility]  [phone] Patients need to be referred by their primary care doctor.   Medication  Assistance: Organization         Address  Phone   Notes  [facility] Medication [facility] [address] [place], [place] [address] [phone] --Must be a resident of [facility] -- Must have NO insurance coverage whatsoever (no Medicaid/ Medicare, etc.) -- The pt. MUST have a primary care doctor that directs their care regularly and follows them in the community   [facility]  [phone]   [employer]  [phone]    Agencies that provide inexpensive medical care: Organization         Address  Phone   Notes  [place]  [phone]   [facility]    [phone]   [facility] [place],  [address] [phone]   [place] [address], [place] [phone]   Planned Parenthood    [phone]   [facility]    [phone]   [place] and [address], [place] Phone:  [phone], Fax:  [phone] Hours of Operation:  9 am - 6 pm, M-F.  Also accepts Medicaid/Medicare and self-pay.  [facility] for [place] [address], [address], [place] Phone: [phone], Fax: [phone]. Hours of Operation:  8:30 am - 5:30 pm, M-F.  Also accepts Medicaid and self-pay.  [facility] [place] [address], [place] Phone: [phone]   [place], [place], [place] [phone], Ext. [phone] Mondays & Thursdays: 7-9 AM.  First 15 patients are seen on a first come, first serve basis.    [place] Providers:  Organization         Address  Phone   Notes  [facility] [address],  [phone] Also accepts self-pay patients.  [facility] [id number] [place], [place]  [phone]   [place], [address], [place]  [phone]   [facility] [address], [place] [phone]   Lucianne Lei [address], [place]   [phone] Only accepts [place] Access [place] patients after they have their name applied to their card.   Self-Pay (no insurance) in [facility]:  Organization         Address  Phone   Notes  Sickle Cell Patients, [facility] [place] [phone]   [facility] [place] [phone]   [facility] [place]  [place], [place], [place] [phone]   [facility]/Dr. Osei-Bonsu  [address], [place] or [place], [address], [place] [phone] Phone number for both  [place] and [place] locations is the same.  [facility] [address] [place] [phone]   [facility] [address] [place] or [address] [phone]   [facility] [address] [place] [phone], phone; [phone], fax Sees patients 1st and 3rd Saturday of every month.  Must not qualify for public or private insurance (i.e. Medicaid, Medicare, [place] Health Choice, Veterans' Benefits)  Household income should be no more than 200% of the poverty level The clinic cannot treat you if you are pregnant or think you are pregnant  Sexually transmitted diseases are not treated at the clinic.    Dental Care: Organization         Address  Phone  Notes  [facility] Department of [facility] [place] [phone] Accepts children up to age 11 who are enrolled in [place] or [place]; pregnant women with a Medicaid card; and children who have applied for Medicaid or [place] Health Choice, but were declined, whose parents can pay a reduced fee at time of service.  [facility]  Department of [facility]  [address], [place] [phone] Accepts children up to age 32 who are enrolled in [place] or [place]; pregnant women with a Medicaid card; and children who have applied for Medicaid or [place] Health Choice, but were declined, whose parents can pay a reduced fee at time of service.  [place] Adult [facility] Access PROGRAM  [place] [phone] Patients are seen by appointment only. Walk-ins are not accepted. [place] will see patients 4 years of age and older. Monday - Tuesday (8am-5pm) Most Wednesdays (8:30-5pm) $30 per visit, cash only  [facility] Adult Dental Access PROGRAM  [address], [facility] [phone] Patients are seen by appointment only. Walk-ins are not accepted. [place] will see patients 23 years of age and older. One Wednesday Evening (Monthly: Volunteer Based).  $30 per visit, cash only  [place]  [phone] for adults; Children under age 75, call Graduate Pediatric Dentistry at [phone]. Children aged 44-14, please call [phone] to request a pediatric application.  Dental services are provided in all areas of dental care including fillings, crowns and bridges, complete and partial dentures, implants, gum treatment, root canals, and extractions. Preventive care is also provided. Treatment is provided to both adults and children. Patients are selected via a lottery and there is often a waiting list.   [facility] [address] [place]  [phone] [email or web address]   [facility] [address] [place], [place] [phone], Ext. [phone] Second and Fourth Thursday of each month, opens at 6:30 AM; Clinic ends at 9 AM.  Patients are seen on a first-come first-served basis, and a limited number are seen during each clinic.   [facility]  [address] Hillard Danker [place], [place] [phone]    Eligibility Requirements You must have lived in [place], [place], or [place] counties for at least the last three months.   You cannot be eligible for state or federal sponsored [employer], including [employer], [place], or [employer].   You generally cannot be eligible for healthcare insurance through your employer.    How to apply: Eligibility screenings are held every Tuesday and Wednesday afternoon from 1:00 pm until 4:00 pm.  You do not need an appointment for the interview!  [facility] [address] [place], [place] [phone]   [facility] Health Department  [phone]   [facility] Health Department  [phone]   [facility] Health Department  [phone]    Behavioral Health Resources in the Community: Intensive Outpatient Programs Organization         Address  Phone  Notes  [facility] [address] [place], [place] [phone]   [facility] [address] [place], [place] [phone]   [facility] [address], [place], [place]  [phone]   [facility] [address]  [place], [place] [phone] or [phone]   Substance Abuse Resources Organization         Address  Phone  Notes  [facility] Drug Services  [phone]   [facility]  [phone]   [facility] [place]  [phone]   Floydene Flock  [phone]   Residential & Outpatient Substance Abuse Program  [phone]   Psychological Services Organization         Address  Phone  Notes  [facility]  [phone]   [facility] Services  [phone]   [facility] Mental Health [address] [place] [phone] or [phone]    Mobile Crisis Teams Organization         Address  Phone  Notes  [facility], Mobile Crisis Care Unit  [phone]   [facility]  [address]  [place], [place] [phone]   Doristine Locks [address] [place] [phone]    Self-Help/Support Groups Organization         Address  Phone             Notes  Mental Health Assoc. of  - variety of support groups  336- I7437963 Call for more information  Narcotics Anonymous (NA), [facility] [address], [employer] [place]  2 meetings at this location   Statistician         Address  Phone  Notes  [facility] [address],    [place]  [phone]   [facility]  [address], [place] [address], [place], [place] [phone]   [facility] [address] [place], [place] [phone] Admissions: 8am-3pm M-F  [facility] [address],    [place], [place] [phone]   [facility] [address] [place], [place], [place] [phone]   [facility] [address],  [place], [place] [phone]   [facility] - Intensive Outpatient [address], [place], [place] [phone]   [facility] (Add[facility] [address] [place]  [place], [place] [phone] or [phone]   [facility] Services (RTS) [address], [place], [place] [phone] Accepts Medicaid  [facility] [place] [address],  [place] [phone] Substance Abuse/Addiction Treatment   [facility] Organization         Address  Phone  Notes  [facility]  [phone]   Angie Fava, PhD [address] Ervin Knack [place], [place]   [phone] or [phone]   [facility] Behavioral   [address] [place], [place] [phone]   [facility] [address] [place], [place] [phone] Insurance/Medicaid/sponsorship through [employer] and Families [address]  [address] [place], [place] [phone] [place], [place] [phone]    Dr. Adele Schilder  [phone]   [facility] Dept. 1) [address] [place] 2) [place] 3)  [place] [address], [place] [phone]  [phone]   [place] [phone] or [phone] (After Hours)

## 2015-08-07 ENCOUNTER — Encounter: Payer: Self-pay | Admitting: Family Medicine

## 2015-08-07 ENCOUNTER — Ambulatory Visit: Payer: BLUE CROSS/BLUE SHIELD | Attending: Family Medicine | Admitting: Family Medicine

## 2015-08-07 VITALS — BP 145/83 | HR 70 | Temp 98.0°F | Resp 16 | Ht 59.0 in | Wt 147.0 lb

## 2015-08-07 DIAGNOSIS — M5431 Sciatica, right side: Secondary | ICD-10-CM | POA: Diagnosis present

## 2015-08-07 DIAGNOSIS — M25561 Pain in right knee: Secondary | ICD-10-CM | POA: Diagnosis not present

## 2015-08-07 MED ORDER — NORTRIPTYLINE HCL 50 MG PO CAPS: 50.0000 mg | ORAL_CAPSULE | Freq: Every day | ORAL | Status: DC

## 2015-08-07 NOTE — Assessment & Plan Note (Signed)
Patient with 1 month of R sided buttock and hip/leg pain, no associated falls or trauma.  Neuro exam intact, no worrisome sxs for cauda equina syndrome.  Plan for x-ray LS spine and hip films, start TCA at bedtime and follow up in 2 weeks. Consideration advanced imaging for possible spinal stenosis versus disc herniation.  Discussed instructions at length with patient.

## 2015-08-07 NOTE — Patient Instructions (Signed)
It was a pleasure to see you today.   For the back pain, I am ordering x-rays of your back and hips.  You may go there at your convenience.   I sent a prescription for NORTRIPTYLINE  tablets, take 1 tablet by mouth at bedtime, for the pain.    You may stop the other medicines for pain.  You may take TYLENOL  tablets, 1 tablet by mouth every 4 to 6 hours as needed for pain (no prescription needed).   FOLLOW UP IN THIS OFFICE IN THE COMING 2 to 3 WEEKS.

## 2015-08-07 NOTE — Progress Notes (Signed)
   Subjective:    Patient ID: Toni Jordan, female    DOB: 1975-11-27, 40 y.o.   MRN: [id number]  HPI Patient seen on 07/26/15 for sciatica and R sided buttock/low back pain, that started about 1 month ago and was not associated with any falls, trauma, MVA, or other movement/activity.  The pain starts in the R lower back and encompasses the R buttock, down the lateral aspect of the R leg to the lower leg.  She reports tingling and numbness in the R foot/leg.  She was given Naproxen and opioids in the ED, says these do not affect the pain.  She says the pain had gotten worse this past Sunday/Monday (Feb 12, 13) and then resumed its baseline intensity as it's been for the past 1 month.   PMHx; History of R knee pain and R leg swelling present for many years back in [place];  The knee pain resolved after 2 months of oral medication therapy in [place] and is not present now.  No trauma history.   NKDA.    Social Hx does not drink alcohol.    Review of Systems No fevers or chills, no falls, no bowel or bladder incontinence or saddle anesthesia.       Objective:   Physical Exam Alert, no apparent distress. Able to get up and down from exam table without any difficulties.   HEENT Neck supple, no cervical adenopathy.  MSK/Neuro: Tenderness along Right-sided LS paraspinous muscles; no point tenderness over R greater trochanter.  Strength and ROM (passive and active) grossly full and symmetric with hip flexion, knee flexion/extension, dorsi/plantarflexion both feet. Palpable dp pulses with brisk cap refill in toes of both feet. No edema noted in ankles bilaterally. Trace pitting edema pretibial both legs.  Straight leg raise to 45 degrees on R, 60 degrees on L.  Bilateral hip ad/abduction, int/external rotation and flexion full passive ROM without pain.  Patellar DTRs 2+ symmetric bilaterally.        Assessment & Plan:  Patient with 1 month of R sided buttock and hip/leg pain, no associated falls or  trauma.  Neuro exam intact, no worrisome sxs for cauda equina syndrome.  Plan for x-ray LS spine and hip films, start TCA at bedtime and follow up in 2 weeks. Consideration advanced imaging for possible spinal stenosis versus disc herniation.  Discussed instructions at length with patient.   Paula Compton, MD

## 2015-08-07 NOTE — Progress Notes (Signed)
Patient's here for f/up sciatica on right side that radiates from right buttock to foot.   Rated pain 6/10. She states she's having pain, and tingling feeling in her right leg.

## 2015-08-13 ENCOUNTER — Other Ambulatory Visit: Payer: Self-pay | Admitting: Family Medicine

## 2015-08-13 ENCOUNTER — Ambulatory Visit
Admission: RE | Admit: 2015-08-13 | Discharge: 2015-08-13 | Disposition: A | Discharge status: Home / Self Care | Payer: BLUE CROSS/BLUE SHIELD | Source: Ambulatory Visit | Attending: Family Medicine | Admitting: Family Medicine

## 2015-08-13 DIAGNOSIS — M5431 Sciatica, right side: Secondary | ICD-10-CM

## 2015-08-19 ENCOUNTER — Telehealth: Payer: Self-pay | Admitting: *Deleted

## 2015-08-19 NOTE — Telephone Encounter (Signed)
-----   Message from Barbaraann Barthel, MD sent at 08/13/2015  6:01 PM EST ----- Please let patient know that her x-rays show some arthritis in her hips.  Please keep follow up visit as scheduled.  JB

## 2015-08-19 NOTE — Telephone Encounter (Signed)
Patient returned to nurse.Marland KitchenMarland KitchenMarland KitchenMarland Kitchenplease follow up .Marland KitchenMarland KitchenMarland Kitchen

## 2015-08-19 NOTE — Telephone Encounter (Signed)
Interpreter Name: Con Memos Interpreter #: [phone] Medical Assistant used [employer] to contact patient.   Medical Assistant left message on patient's home and cell voicemail. Voicemail states to give a call back to [place] with [facility] at [phone].

## 2015-08-20 NOTE — Telephone Encounter (Signed)
Medical Assistant used [employer] to contact patient.  Interpreter Name: Gertie Fey #: [id number]  Medical Assistant left message on patient's home and cell voicemail. Voicemail states to give a call back to [place] with [facility] at [phone].

## 2015-08-21 ENCOUNTER — Encounter: Payer: Self-pay | Admitting: Internal Medicine

## 2015-08-21 ENCOUNTER — Ambulatory Visit: Payer: BLUE CROSS/BLUE SHIELD | Attending: Internal Medicine | Admitting: Internal Medicine

## 2015-08-21 VITALS — BP 131/83 | HR 82 | Temp 98.0°F | Resp 16 | Ht 59.0 in | Wt 148.0 lb

## 2015-08-21 DIAGNOSIS — Z79899 Other long term (current) drug therapy: Secondary | ICD-10-CM | POA: Diagnosis not present

## 2015-08-21 DIAGNOSIS — M1611 Unilateral primary osteoarthritis, right hip: Secondary | ICD-10-CM | POA: Insufficient documentation

## 2015-08-21 DIAGNOSIS — M545 Low back pain: Secondary | ICD-10-CM | POA: Insufficient documentation

## 2015-08-21 DIAGNOSIS — M79604 Pain in right leg: Secondary | ICD-10-CM | POA: Diagnosis present

## 2015-08-21 DIAGNOSIS — M5431 Sciatica, right side: Secondary | ICD-10-CM | POA: Diagnosis not present

## 2015-08-21 DIAGNOSIS — R202 Paresthesia of skin: Secondary | ICD-10-CM | POA: Diagnosis not present

## 2015-08-21 NOTE — Progress Notes (Signed)
Patient ID: Toni Jordan, fErasmo Downerle   DOB: 1976/02/15, 40 y.o.   MRN: [id number]  CC: leg pain   HPI: Toni Jordan is a 40 y.o. female here today for a follow up visit.  Patient has no past medical history. Patient reports that he was evaluated by Dr. Mauricio Po last week for right sided lower back pain. She has had this pain for over 6 weeks. She has completed a xray of bilateral hips and lumbar spine that were essentially unrevealing except right hip osteoarthritis. Pain is located in the right lower back and radiates to the right buttock and down right leg. Some associated numbness and tingling in right leg and foot. She has tried Naproxen and opiods with mild relief. No history of injury or trauma. She denies bowel and bladder dysfunction   No Known Allergies Past Medical History  Diagnosis Date  . Vaginal delivery 2000,2005,1998,1995   Current Outpatient Prescriptions on File Prior to Visit  Medication Sig Dispense Refill  . nortriptyline (PAMELOR) 50 MG capsule Take 1 capsule (50 mg total) by mouth at bedtime. 30 capsule 1  . docusate sodium (COLACE) 100 MG capsule Take 1 capsule (100 mg total) by mouth 2 (two) times daily as needed. (Patient not taking: Reported on 08/07/2015) 30 capsule 2  . HYDROcodone-acetaminophen (NORCO/VICODIN) 5-325 MG tablet Take 2 tablets by mouth every 4 (four) hours as needed. 6 tablet 0  . ibuprofen (ADVIL,MOTRIN) 600 MG tablet Take 1 tablet (600 mg total) by mouth every 6 (six) hours as needed. (Patient not taking: Reported on 10/04/2014) 30 tablet 1  . naproxen (NAPROSYN) 500 MG tablet Take 1 tablet (500 mg total) by mouth 2 (two) times daily. 30 tablet 0  . oxyCODONE-acetaminophen (PERCOCET/ROXICET) 5-325 MG per tablet Take 1 tablet by mouth every 4 (four) hours as needed. (Patient not taking: Reported on 10/04/2014) 20 tablet 0   No current facility-administered medications on file prior to visit.   History reviewed. No pertinent family history. Social History    Social History  . Marital Status: Married    Spouse Name: N/A  . Number of Children: N/A  . Years of Education: N/A   Occupational History  . Not on file.   Social History Main Topics  . Smoking status: Never Smoker   . Smokeless tobacco: Never Used  . Alcohol Use: No  . Drug Use: No  . Sexual Activity: No   Other Topics Concern  . Not on file   Social History Narrative    Review of Systems: Other than what is stated in HPI, all other systems are negative.   Objective:   Filed Vitals:   08/21/15 1610  BP: 131/83  Pulse: 82  Temp: 98 F (36.7 C)  Resp: 16    Physical Exam  Constitutional: She is oriented to person, place, and time.  Cardiovascular: Normal rate, regular rhythm and normal heart sounds.   Pulmonary/Chest: Effort normal and breath sounds normal.  Musculoskeletal: Normal range of motion. She exhibits no tenderness.  Pain with right leg/hip ROM  Neurological: She is alert and oriented to person, place, and time.     Lab Results  Component Value Date   WBC 11.9* 09/24/2014   HGB 14.2 09/24/2014   HCT 40.8 09/24/2014   MCV 83.8 09/24/2014   PLT 288 09/24/2014   No results found for: CREATININE, BUN, NA, K, CL, CO2  No results found for: HGBA1C Lipid Panel  No results found for: CHOL, TRIG, HDL, CHOLHDL, VLDL, LDLCALC  Assessment and plan:   [name] was seen today for leg pain.  Diagnoses and all orders for this visit:  Sciatica of right side -     Ambulatory referral to Orthopedic Surgery Review of lumbar spine xray reveals that a slight curvature of thoracic spine. Right hip has early osteoarthritis changes.   Return if symptoms worsen or fail to improve.       Ambrose Finland, NP-C [facility] [phone] 08/21/2015, 4:16 PM

## 2015-08-21 NOTE — Progress Notes (Signed)
Interpreter line used Mila ID# [id number] Patient here to establish care Complains of right sided leg and hip pain  Already had the x ray that was ordered by DR Mauricio Po

## 2015-09-08 ENCOUNTER — Ambulatory Visit (INDEPENDENT_AMBULATORY_CARE_PROVIDER_SITE_OTHER): Payer: BLUE CROSS/BLUE SHIELD | Admitting: Sports Medicine

## 2015-09-08 ENCOUNTER — Encounter: Payer: Self-pay | Admitting: Sports Medicine

## 2015-09-08 VITALS — BP 145/84 | HR 90 | Ht 61.0 in | Wt 140.0 lb

## 2015-09-08 DIAGNOSIS — M5431 Sciatica, right side: Secondary | ICD-10-CM

## 2015-09-08 NOTE — Progress Notes (Signed)
   Subjective:    Patient ID: Toni Jordan, female    DOB: 06/14/1976, 40 y.o.   MRN: [id number]  HPI chief complaint: Right leg pain  Very pleasant 40 year old female comes in today complaining of 2 months of right leg pain. No trauma that she can recall but a gradual onset of pain that begins in the posterior right hip and radiates down the right leg into the right foot. Pain is constantly present. She has associated numbness and tingling. Her PCP put her on nortriptyline which did improve the numbness and tingling but the pain has persisted. She denies any similar problems in the past. She had x-rays of her lumbar spine done which were fairly unremarkable other than some changes due to spasm. Please note that the history is obtained with the help of an interpreter.  Past medical history reviewed Medications reviewed Allergies reviewed    Review of Systems    as above Objective:   Physical Exam  Well-developed, well-nourished. No acute distress. Awake alert and oriented 3.  Lumbar spine: Fairly good lumbar range of motion. Neurological exam: 2/4 patellar reflexes bilaterally. Right Achilles reflex is absent. It is 1/4 on the left. There is weakness with resisted plantar flexion on the right compared to the left. Otherwise strength is 5/5. No noticeable atrophy.      Assessment & Plan:   Low back pain with right-sided sciatica-rule out lumbar disc herniation  MRI of the lumbar spine specifically to rule out a lumbar disc herniation. Patient will follow-up with me in the office after that study to review the results and delineate a more definitive treatment plan.

## 2015-09-08 NOTE — Progress Notes (Signed)
Patient ID: Toni Jordan, female   DOB: 10/26/1975, 40 y.o.   MRN: [id number]  Interpreter for visit is Bishnu Paudel per language resources

## 2015-09-10 ENCOUNTER — Encounter: Payer: Self-pay | Admitting: *Deleted

## 2015-09-10 NOTE — Telephone Encounter (Signed)
Communication letter has been sent to the patient. MA attempted to reach the patient on 2 separate dates.

## 2015-09-16 ENCOUNTER — Other Ambulatory Visit: Payer: BLUE CROSS/BLUE SHIELD

## 2015-09-16 ENCOUNTER — Ambulatory Visit
Admission: RE | Admit: 2015-09-16 | Discharge: 2015-09-16 | Disposition: A | Discharge status: Home / Self Care | Payer: BLUE CROSS/BLUE SHIELD | Source: Ambulatory Visit | Attending: Sports Medicine | Admitting: Sports Medicine

## 2015-09-16 DIAGNOSIS — M5431 Sciatica, right side: Secondary | ICD-10-CM

## 2015-09-22 ENCOUNTER — Encounter: Payer: Self-pay | Admitting: Sports Medicine

## 2015-09-22 ENCOUNTER — Ambulatory Visit (INDEPENDENT_AMBULATORY_CARE_PROVIDER_SITE_OTHER): Payer: BLUE CROSS/BLUE SHIELD | Admitting: Sports Medicine

## 2015-09-22 VITALS — BP 128/79 | HR 82 | Ht 61.0 in | Wt 140.0 lb

## 2015-09-22 DIAGNOSIS — M5431 Sciatica, right side: Secondary | ICD-10-CM

## 2015-09-22 NOTE — Progress Notes (Signed)
Patient ID: Toni Jordan, female   DOB: 09/09/1975, 40 y.o.   MRN: [id number]   Interpreter for visit is Vishnu Paudel

## 2015-09-23 ENCOUNTER — Other Ambulatory Visit: Payer: Self-pay | Admitting: Sports Medicine

## 2015-09-23 DIAGNOSIS — M5136 Other intervertebral disc degeneration, lumbar region: Secondary | ICD-10-CM

## 2015-09-23 NOTE — Progress Notes (Signed)
   Subjective:    Patient ID: Toni Jordan, female    DOB: 02/24/1976, 40 y.o.   MRN: [id number]  HPI   Patient comes in today to discuss MRI findings of her lumbar spine. 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 It may also possibly impinge on the right S2 nerve root. Her pain will range from a 3-5/10 occasionally. She does get numbness on occasion as well. She denies any weakness in the right leg. History is obtained through an interpreter.    Review of Systems     Objective:   Physical Exam  Well-developed, well-nourished. No acute distress. Patient is sitting comfortably in the exam room   neurological exam: Negative straight leg raise bilaterally. She still has an absent Achilles reflex on the right compared to a 1/4 on the left. The weakness with plantar flexion appreciated on her previous exam on the right has improved. She now has 5/5 strength in both lower extremities. No atrophy. Sensation is intact to light touch grossly.  MRI is as above      Assessment & Plan:  Low back pain and right leg radiculopathy secondary to L5-S1 disc extrusion  I discussed the patient's treatment options including epidural steroid injections and neurosurgical referral. Patient's pain is currently tolerable and given her absence of true weakness on exam, I think we can continue to treat this conservatively. We will start with an epidural steroid injection and the patient will follow-up with me 2 weeks afterwards. If symptoms persist, then I will consider neurosurgical referral. If symptoms improve but do not resolve, then we could consider repeat epidural steroid injections.

## 2015-09-29 ENCOUNTER — Other Ambulatory Visit: Payer: Self-pay | Admitting: Sports Medicine

## 2015-09-29 ENCOUNTER — Ambulatory Visit
Admission: RE | Admit: 2015-09-29 | Discharge: 2015-09-29 | Disposition: A | Discharge status: Home / Self Care | Payer: BLUE CROSS/BLUE SHIELD | Source: Ambulatory Visit | Attending: Sports Medicine | Admitting: Sports Medicine

## 2015-09-29 ENCOUNTER — Other Ambulatory Visit: Payer: BLUE CROSS/BLUE SHIELD

## 2015-09-29 DIAGNOSIS — M5136 Other intervertebral disc degeneration, lumbar region: Secondary | ICD-10-CM

## 2015-09-29 MED ORDER — METHYLPREDNISOLONE ACETATE 40 MG/ML INJ SUSP (RADIOLOG
120.0000 mg | Freq: Once | INTRAMUSCULAR | Status: AC
  Administered 2015-09-29: 120 mg via EPIDURAL

## 2015-09-29 MED ORDER — IOHEXOL 180 MG/ML  SOLN
1.0000 mL | Freq: Once | INTRAMUSCULAR | Status: AC | PRN
  Administered 2015-09-29: 1 mL via EPIDURAL

## 2015-09-29 NOTE — Discharge Instructions (Signed)

## 2015-10-20 ENCOUNTER — Encounter: Payer: Self-pay | Admitting: Sports Medicine

## 2015-10-20 ENCOUNTER — Ambulatory Visit (INDEPENDENT_AMBULATORY_CARE_PROVIDER_SITE_OTHER): Payer: BLUE CROSS/BLUE SHIELD | Admitting: Sports Medicine

## 2015-10-20 VITALS — BP 133/75 | Ht 61.0 in | Wt 140.0 lb

## 2015-10-20 DIAGNOSIS — M5126 Other intervertebral disc displacement, lumbar region: Secondary | ICD-10-CM

## 2015-10-20 NOTE — Progress Notes (Signed)
   Subjective:   Patient ID: Toni Jordan, female    DOB: 05/17/1976, 40 y.o.   MRN: [id number]  HPI  40 yo female returning for follow up of right low back pain with sciatica. 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 She's had 50% improvement in pain and associated numbness. Still with intermittent tingling in the right foot. Denies weakness in the right leg, though her right calf does get "tired" when traversing stairs. Nepali interpretor used throughout encounter.  Review of Systems Pt denies any current bowel/bladder problems, fever, chills, unintentional weight loss, night time awakenings secondary to pain.     Objective:   Physical Exam Gen: WDWN 40 yo female in no distress  Back:  Normal skin. Spine with normal alignment and no deformity. Lumbar ROM is near full. Straight leg raise is negative bilaterally. Neuro: RLE sensation intact to light touch throughout, plantar flexion 5/5. Patellar DTR's 2+, achilles DTR trace on right, 1+ on left.     Assessment & Plan:  Toni Jordan is a 40 y.o. female with L5-S1 disc extrusion causing low back pain with radiculopathy. Improved with epidural steroid injection. After discussion with the patient, we will hold off on 2nd injection at this time and start physical therapy. Will follow up here in 4 weeks.    Patient seen and evaluated with the resident. I agree with the above plan of care. Patient is improving but symptoms have not completely resolved. She will start physical therapy and follow-up with me in 4 weeks. I did discuss the possibility of a repeat epidural steroid injection but she does not think her symptoms are severe enough at this point in time to warrant that. Given her improvement, I think we can hold on neurosurgical referral.

## 2015-10-29 ENCOUNTER — Other Ambulatory Visit: Payer: Self-pay | Admitting: Family Medicine

## 2015-11-10 ENCOUNTER — Encounter: Payer: Self-pay | Admitting: Sports Medicine

## 2015-11-10 ENCOUNTER — Ambulatory Visit (INDEPENDENT_AMBULATORY_CARE_PROVIDER_SITE_OTHER): Payer: BLUE CROSS/BLUE SHIELD | Admitting: Sports Medicine

## 2015-11-10 VITALS — BP 134/70 | Ht 60.0 in | Wt 140.0 lb

## 2015-11-10 DIAGNOSIS — M5431 Sciatica, right side: Secondary | ICD-10-CM

## 2015-11-10 MED ORDER — METHYLPREDNISOLONE ACETATE 80 MG/ML IJ SUSP
80.0000 mg | Freq: Once | INTRAMUSCULAR | Status: AC
  Administered 2015-11-10: 80 mg via INTRAMUSCULAR

## 2015-11-10 MED ORDER — NORTRIPTYLINE HCL 50 MG PO CAPS: 50.0000 mg | ORAL_CAPSULE | Freq: Every day | ORAL | Status: DC

## 2015-11-10 NOTE — Progress Notes (Signed)
    Subjective: CC:f/u back pain HPI: Nepali interpreter utilized during this encounter  40 yo female returning for follow up of right low back pain with sciatica. 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   Patient notes worsening right lumbar pain since our last visit, especially over the last 2 weeks. She continues to have a shooting/burning pain from her back down into her foot. She notes that the pain is constant. She gets tingling and "shocks" down in her right foot. Can't judge strength as pain is a confounder.  Her pain is now worse than prior to her first injection. Walking  makes the pain worse. Nothing makes the pain better. She never started PT.   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    ROS: All other systems reviewed and are negative.  Past Medical History Patient Active Problem List   Diagnosis Date Noted  . Sciatica of right side 08/07/2015    Medications- reviewed and updated Current Outpatient Prescriptions  Medication Sig Dispense Refill  . naproxen (NAPROSYN) 500 MG tablet Take 1 tablet (500 mg total) by mouth 2 (two) times daily. (Patient not taking: Reported on 09/08/2015) 30 tablet 0  . nortriptyline (PAMELOR) 50 MG capsule Take 1 capsule (50 mg total) by mouth at bedtime. 30 capsule 1   No current facility-administered medications for this visit.    Objective: Office vital signs reviewed. BP 134/70 mmHg  Ht 5' (1.524 m)  Wt 140 lb (63.504 kg)  BMI 27.34 kg/m2   Physical Examination:  General: Awake, alert, well- nourished, NAD  Back: normal to inspection. tenderness over the R lumbar region. Full back extension without pain. Full back flexion with some reported pain. Side bends and rotation intact/equal.   Sensation equal bilaterally. Negative SLR.  4+/5 dorsiflexion and plantar flexion on the right. Absent achilles reflex on the R,  1+ on the left. Patellar reflexes equal bilaterally.   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 Dose of Depomedrol 80mg  IM give today. Referral to Dr. Yevette Edwardsumonski as I suspect she will need surgery in the future. Refilled patient's nortriptyline PRN sleep per her request.  Follow up as needed.  Joanna Puffrystal S. Giani Betzold PGY-2, [facility]   Patient seen and evaluated with the resident. I agree with the above plan of care. Patient is quickly failing conservative treatment for her lumbar disc herniation. I will send her for a second lumbar ESI and I will also arrange for her to meet in consultation with Dr. Yevette Edwardsumonski  to discuss possible surgical options. Patient will follow-up with me as needed.

## 2015-11-19 ENCOUNTER — Other Ambulatory Visit: Payer: Self-pay | Admitting: Sports Medicine

## 2015-11-19 DIAGNOSIS — M5441 Lumbago with sciatica, right side: Secondary | ICD-10-CM

## 2015-11-21 ENCOUNTER — Other Ambulatory Visit: Payer: Self-pay | Admitting: Orthopedic Surgery

## 2015-11-24 ENCOUNTER — Ambulatory Visit: Payer: BLUE CROSS/BLUE SHIELD | Admitting: Sports Medicine

## 2015-11-24 NOTE — H&P (Signed)
     PREOPERATIVE H&P  Chief Complaint: R leg pain  HPI: Toni Jordan is a 40 y.o. female who presents with ongoing pain in the R leg x 5 months  MRI reveals a large L5/S1 HNP  Patient has failed multiple forms of conservative care and continues to have pain (see office notes for additional details regarding the patient's full course of treatment)  Past Medical History  Diagnosis Date  . Vaginal delivery 2000,2005,1998,1995   Past Surgical History  Procedure Laterality Date  . No past surgeries    . Laparoscopic bilateral salpingectomy Bilateral 09/24/2014    Procedure: LAPAROSCOPIC BILATERAL TUBAL LIGATION;  Surgeon: Catalina AntiguaPeggy Constant, MD;  Location: [facility];  Service: Gynecology;  Laterality: Bilateral;   Social History   Social History  . Marital Status: Married    Spouse Name: N/A  . Number of Children: N/A  . Years of Education: N/A   Social History Main Topics  . Smoking status: Never Smoker   . Smokeless tobacco: Never Used  . Alcohol Use: No  . Drug Use: No  . Sexual Activity: No   Other Topics Concern  . Not on file   Social History Narrative   No family history on file. No Known Allergies Prior to Admission medications   Medication Sig Start Date End Date Taking? Authorizing Provider  naproxen (NAPROSYN) 500 MG tablet Take 1 tablet (500 mg total) by mouth 2 (two) times daily. Patient not taking: Reported on 09/08/2015 07/26/15   Catha GosselinHanna Patel-Mills, PA-C  nortriptyline (PAMELOR) 50 MG capsule Take 1 capsule (50 mg total) by mouth at bedtime. 11/10/15   Ralene Corkimothy R Draper, DO     All other systems have been reviewed and were otherwise negative with the exception of those mentioned in the HPI and as above.  Physical Exam: There were no vitals filed for this visit.  General: Alert, no acute distress Cardiovascular: No pedal edema Respiratory: No cyanosis, no use of accessory musculature Skin: No lesions in the area of chief complaint Neurologic: Sensation intact  distally Psychiatric: Patient is competent for consent with normal mood and affect Lymphatic: No axillary or cervical lymphadenopathy  MUSCULOSKELETAL: + SLR on the right  Assessment/Plan: Right leg pain Plan for Procedure(s): RIGHT SIDED LUMBAR 5-SACRUM 1 MICRODISCECTOMY   Emilee HeroUMONSKI,Yasmin Dibello LEONARD, MD 11/24/2015 8:16 AM

## 2015-11-25 ENCOUNTER — Encounter (HOSPITAL_COMMUNITY): Payer: Self-pay | Admitting: *Deleted

## 2015-11-25 NOTE — Progress Notes (Signed)
SDW call completed using [employer] ID # [id number].

## 2015-11-26 ENCOUNTER — Ambulatory Visit (HOSPITAL_COMMUNITY)
Admission: RE | Admit: 2015-11-26 | Discharge: 2015-11-26 | Disposition: A | Discharge status: Home / Self Care | Payer: BLUE CROSS/BLUE SHIELD | Source: Ambulatory Visit | Attending: Orthopedic Surgery | Admitting: Orthopedic Surgery

## 2015-11-26 ENCOUNTER — Ambulatory Visit (HOSPITAL_COMMUNITY): Payer: BLUE CROSS/BLUE SHIELD

## 2015-11-26 ENCOUNTER — Encounter (HOSPITAL_COMMUNITY)
Admission: RE | Disposition: A | Discharge status: Home / Self Care | Payer: Self-pay | Source: Ambulatory Visit | Attending: Orthopedic Surgery

## 2015-11-26 ENCOUNTER — Encounter (HOSPITAL_COMMUNITY): Payer: Self-pay | Admitting: General Practice

## 2015-11-26 ENCOUNTER — Ambulatory Visit (HOSPITAL_COMMUNITY): Payer: BLUE CROSS/BLUE SHIELD | Admitting: Certified Registered Nurse Anesthetist

## 2015-11-26 DIAGNOSIS — M5117 Intervertebral disc disorders with radiculopathy, lumbosacral region: Secondary | ICD-10-CM | POA: Insufficient documentation

## 2015-11-26 DIAGNOSIS — Z01818 Encounter for other preprocedural examination: Secondary | ICD-10-CM

## 2015-11-26 DIAGNOSIS — M5127 Other intervertebral disc displacement, lumbosacral region: Secondary | ICD-10-CM | POA: Diagnosis present

## 2015-11-26 DIAGNOSIS — M5410 Radiculopathy, site unspecified: Secondary | ICD-10-CM

## 2015-11-26 HISTORY — PX: LUMBAR LAMINECTOMY/DECOMPRESSION MICRODISCECTOMY: SHX5026

## 2015-11-26 HISTORY — DX: Respiratory tuberculosis unspecified: A15.9

## 2015-11-26 LAB — CBC WITH DIFFERENTIAL/PLATELET
BASOS PCT: 0 %
Basophils Absolute: 0 10*3/uL (ref 0.0–0.1)
EOS ABS: 0.1 10*3/uL (ref 0.0–0.7)
Eosinophils Relative: 1 %
HCT: 40.7 % (ref 36.0–46.0)
HEMOGLOBIN: 13.5 g/dL (ref 12.0–15.0)
LYMPHS ABS: 4.1 10*3/uL — AB (ref 0.7–4.0)
Lymphocytes Relative: 33 %
MCH: 27.8 pg (ref 26.0–34.0)
MCHC: 33.2 g/dL (ref 30.0–36.0)
MCV: 83.7 fL (ref 78.0–100.0)
Monocytes Absolute: 0.7 10*3/uL (ref 0.1–1.0)
Monocytes Relative: 5 %
NEUTROS PCT: 61 %
Neutro Abs: 7.8 10*3/uL — ABNORMAL HIGH (ref 1.7–7.7)
Platelets: 290 10*3/uL (ref 150–400)
RBC: 4.86 MIL/uL (ref 3.87–5.11)
RDW: 12.9 % (ref 11.5–15.5)
WBC: 12.6 10*3/uL — AB (ref 4.0–10.5)

## 2015-11-26 LAB — URINALYSIS, ROUTINE W REFLEX MICROSCOPIC
Bilirubin Urine: NEGATIVE
GLUCOSE, UA: NEGATIVE mg/dL
HGB URINE DIPSTICK: NEGATIVE
Ketones, ur: NEGATIVE mg/dL
Nitrite: NEGATIVE
Protein, ur: NEGATIVE mg/dL
SPECIFIC GRAVITY, URINE: 1.009 (ref 1.005–1.030)
pH: 6.5 (ref 5.0–8.0)

## 2015-11-26 LAB — COMPREHENSIVE METABOLIC PANEL
ALBUMIN: 3.7 g/dL (ref 3.5–5.0)
ALK PHOS: 120 U/L (ref 38–126)
ALT: 27 U/L (ref 14–54)
AST: 24 U/L (ref 15–41)
Anion gap: 7 (ref 5–15)
BUN: 12 mg/dL (ref 6–20)
CALCIUM: 9.1 mg/dL (ref 8.9–10.3)
CO2: 26 mmol/L (ref 22–32)
CREATININE: 0.69 mg/dL (ref 0.44–1.00)
Chloride: 105 mmol/L (ref 101–111)
GFR calc Af Amer: >60 mL/min (ref >60)
GFR calc non Af Amer: >60 mL/min (ref >60)
GLUCOSE: 117 mg/dL — AB (ref 65–99)
Potassium: 3.9 mmol/L (ref 3.5–5.1)
Sodium: 138 mmol/L (ref 135–145)
Total Bilirubin: 0.5 mg/dL (ref 0.3–1.2)
Total Protein: 8 g/dL (ref 6.5–8.1)

## 2015-11-26 LAB — APTT: aPTT: 31 seconds (ref 24–37)

## 2015-11-26 LAB — URINE MICROSCOPIC-ADD ON

## 2015-11-26 LAB — PREGNANCY, URINE: PREG TEST UR: NEGATIVE

## 2015-11-26 LAB — PROTIME-INR
INR: 1 (ref 0.00–1.49)
Prothrombin Time: 13.4 s (ref 11.6–15.2)

## 2015-11-26 SURGERY — LUMBAR LAMINECTOMY/DECOMPRESSION MICRODISCECTOMY
Anesthesia: General | Laterality: Right

## 2015-11-26 MED ORDER — LACTATED RINGERS IV SOLN: INTRAVENOUS | Status: DC

## 2015-11-26 MED ORDER — PROPOFOL 10 MG/ML IV BOLUS
INTRAVENOUS | Status: AC
Start: 2015-11-26 — End: 2015-11-26
  Filled 2015-11-26: qty 20

## 2015-11-26 MED ORDER — SUCCINYLCHOLINE CHLORIDE 20 MG/ML IJ SOLN
INTRAMUSCULAR | Status: DC | PRN
  Administered 2015-11-26: 100 mg via INTRAVENOUS

## 2015-11-26 MED ORDER — LIDOCAINE 2% (20 MG/ML) 5 ML SYRINGE
INTRAMUSCULAR | Status: AC
  Filled 2015-11-26: qty 5

## 2015-11-26 MED ORDER — POVIDONE-IODINE 10 % EX SOLN
Freq: Once | CUTANEOUS | Status: DC
  Filled 2015-11-26: qty 118

## 2015-11-26 MED ORDER — ONDANSETRON HCL 4 MG/2ML IJ SOLN
INTRAMUSCULAR | Status: DC | PRN
  Administered 2015-11-26: 4 mg via INTRAVENOUS

## 2015-11-26 MED ORDER — ROCURONIUM BROMIDE 50 MG/5ML IV SOLN
INTRAVENOUS | Status: AC
Start: 2015-11-26 — End: 2015-11-26
  Filled 2015-11-26: qty 1

## 2015-11-26 MED ORDER — PROMETHAZINE HCL 25 MG/ML IJ SOLN: 6.2500 mg | INTRAMUSCULAR | Status: DC | PRN

## 2015-11-26 MED ORDER — 0.9 % SODIUM CHLORIDE (POUR BTL) OPTIME
TOPICAL | Status: DC | PRN
  Administered 2015-11-26: 1000 mL

## 2015-11-26 MED ORDER — LIDOCAINE HCL (CARDIAC) 20 MG/ML IV SOLN
INTRAVENOUS | Status: DC | PRN
  Administered 2015-11-26: 60 mg via INTRATRACHEAL

## 2015-11-26 MED ORDER — ARTIFICIAL TEARS OP OINT
TOPICAL_OINTMENT | OPHTHALMIC | Status: AC
  Filled 2015-11-26: qty 3.5

## 2015-11-26 MED ORDER — FENTANYL CITRATE (PF) 250 MCG/5ML IJ SOLN
INTRAMUSCULAR | Status: DC | PRN
  Administered 2015-11-26: 100 ug via INTRAVENOUS
  Administered 2015-11-26: 50 ug via INTRAVENOUS
  Administered 2015-11-26: 100 ug via INTRAVENOUS

## 2015-11-26 MED ORDER — METHYLENE BLUE 0.5 % INJ SOLN
INTRAVENOUS | Status: DC | PRN
  Administered 2015-11-26: .2 mL via SUBMUCOSAL

## 2015-11-26 MED ORDER — PROPOFOL 10 MG/ML IV BOLUS
INTRAVENOUS | Status: DC | PRN
  Administered 2015-11-26: 50 mg via INTRAVENOUS
  Administered 2015-11-26: 150 mg via INTRAVENOUS

## 2015-11-26 MED ORDER — SUGAMMADEX SODIUM 200 MG/2ML IV SOLN
INTRAVENOUS | Status: DC | PRN
  Administered 2015-11-26: 200 mg via INTRAVENOUS

## 2015-11-26 MED ORDER — MEPERIDINE HCL 25 MG/ML IJ SOLN: 6.2500 mg | INTRAMUSCULAR | Status: DC | PRN

## 2015-11-26 MED ORDER — POVIDONE-IODINE 7.5 % EX SOLN
Freq: Once | CUTANEOUS | Status: DC
Start: 2015-11-26 — End: 2015-11-26
  Filled 2015-11-26: qty 118

## 2015-11-26 MED ORDER — EPHEDRINE SULFATE 50 MG/ML IJ SOLN
INTRAMUSCULAR | Status: DC | PRN
  Administered 2015-11-26: 5 mg via INTRAVENOUS
  Administered 2015-11-26: 10 mg via INTRAVENOUS

## 2015-11-26 MED ORDER — THROMBIN 20000 UNITS EX SOLR
CUTANEOUS | Status: DC | PRN
  Administered 2015-11-26: 20 mL via TOPICAL

## 2015-11-26 MED ORDER — ROCURONIUM BROMIDE 100 MG/10ML IV SOLN
INTRAVENOUS | Status: DC | PRN
  Administered 2015-11-26: 50 mg via INTRAVENOUS

## 2015-11-26 MED ORDER — HYDROMORPHONE HCL 1 MG/ML IJ SOLN: 0.2500 mg | INTRAMUSCULAR | Status: DC | PRN

## 2015-11-26 MED ORDER — ARTIFICIAL TEARS OP OINT
TOPICAL_OINTMENT | OPHTHALMIC | Status: DC | PRN
  Administered 2015-11-26: 1 via OPHTHALMIC

## 2015-11-26 MED ORDER — EPHEDRINE 5 MG/ML INJ
INTRAVENOUS | Status: AC
  Filled 2015-11-26: qty 10

## 2015-11-26 MED ORDER — GLYCOPYRROLATE 0.2 MG/ML IJ SOLN
INTRAMUSCULAR | Status: DC | PRN
  Administered 2015-11-26: 0.1 mg via INTRAVENOUS

## 2015-11-26 MED ORDER — CEFAZOLIN SODIUM-DEXTROSE 2-4 GM/100ML-% IV SOLN
2.0000 g | INTRAVENOUS | Status: AC
  Administered 2015-11-26: 2 g via INTRAVENOUS
  Filled 2015-11-26: qty 100

## 2015-11-26 MED ORDER — ONDANSETRON HCL 4 MG/2ML IJ SOLN
INTRAMUSCULAR | Status: AC
Start: 2015-11-26 — End: 2015-11-26
  Filled 2015-11-26: qty 2

## 2015-11-26 MED ORDER — SUCCINYLCHOLINE CHLORIDE 200 MG/10ML IV SOSY
PREFILLED_SYRINGE | INTRAVENOUS | Status: AC
  Filled 2015-11-26: qty 10

## 2015-11-26 MED ORDER — PHENYLEPHRINE HCL 10 MG/ML IJ SOLN
INTRAMUSCULAR | Status: DC | PRN
  Administered 2015-11-26 (×2): 120 ug via INTRAVENOUS
  Administered 2015-11-26 (×2): 80 ug via INTRAVENOUS

## 2015-11-26 MED ORDER — METOPROLOL TARTRATE 5 MG/5ML IV SOLN
INTRAVENOUS | Status: AC
  Filled 2015-11-26: qty 5

## 2015-11-26 MED ORDER — BUPIVACAINE-EPINEPHRINE (PF) 0.25% -1:200000 IJ SOLN
INTRAMUSCULAR | Status: AC
  Filled 2015-11-26: qty 30

## 2015-11-26 MED ORDER — HYDROMORPHONE HCL 1 MG/ML IJ SOLN
0.2500 mg | INTRAMUSCULAR | Status: DC | PRN
  Administered 2015-11-26 (×2): 0.5 mg via INTRAVENOUS

## 2015-11-26 MED ORDER — FENTANYL CITRATE (PF) 250 MCG/5ML IJ SOLN
INTRAMUSCULAR | Status: AC
  Filled 2015-11-26: qty 5

## 2015-11-26 MED ORDER — HEMOSTATIC AGENTS (NO CHARGE) OPTIME
TOPICAL | Status: DC | PRN
  Administered 2015-11-26: 1 via TOPICAL

## 2015-11-26 MED ORDER — METHYLPREDNISOLONE ACETATE 40 MG/ML IJ SUSP
INTRAMUSCULAR | Status: DC | PRN
  Administered 2015-11-26: 40 mg

## 2015-11-26 MED ORDER — PHENYLEPHRINE 40 MCG/ML (10ML) SYRINGE FOR IV PUSH (FOR BLOOD PRESSURE SUPPORT)
PREFILLED_SYRINGE | INTRAVENOUS | Status: AC
  Filled 2015-11-26: qty 10

## 2015-11-26 MED ORDER — SUGAMMADEX SODIUM 200 MG/2ML IV SOLN
INTRAVENOUS | Status: AC
Start: 2015-11-26 — End: 2015-11-26
  Filled 2015-11-26: qty 2

## 2015-11-26 MED ORDER — METHYLPREDNISOLONE ACETATE 40 MG/ML IJ SUSP
INTRAMUSCULAR | Status: AC
  Filled 2015-11-26: qty 1

## 2015-11-26 MED ORDER — MIDAZOLAM HCL 2 MG/2ML IJ SOLN
INTRAMUSCULAR | Status: AC
  Filled 2015-11-26: qty 2

## 2015-11-26 MED ORDER — LACTATED RINGERS IV SOLN
INTRAVENOUS | Status: DC
  Administered 2015-11-26 (×2): via INTRAVENOUS

## 2015-11-26 MED ORDER — MIDAZOLAM HCL 2 MG/2ML IJ SOLN
INTRAMUSCULAR | Status: DC | PRN
  Administered 2015-11-26: 2 mg via INTRAVENOUS

## 2015-11-26 MED ORDER — GLYCOPYRROLATE 0.2 MG/ML IV SOSY
PREFILLED_SYRINGE | INTRAVENOUS | Status: AC
  Filled 2015-11-26: qty 3

## 2015-11-26 MED ORDER — THROMBIN 20000 UNITS EX SOLR
CUTANEOUS | Status: AC
Start: 2015-11-26 — End: 2015-11-26
  Filled 2015-11-26: qty 20000

## 2015-11-26 MED ORDER — HYDROMORPHONE HCL 1 MG/ML IJ SOLN
INTRAMUSCULAR | Status: DC
Start: 2015-11-26 — End: 2015-11-26
  Filled 2015-11-26: qty 1

## 2015-11-26 MED ORDER — METOPROLOL TARTARATE 1 MG/ML SYRINGE (5ML)
Status: DC | PRN
Start: 2015-11-26 — End: 2015-11-26
  Administered 2015-11-26: 2.5 mg via INTRAVENOUS
  Administered 2015-11-26: 1.5 mg via INTRAVENOUS

## 2015-11-26 MED ORDER — PROPOFOL 500 MG/50ML IV EMUL
INTRAVENOUS | Status: DC | PRN
  Administered 2015-11-26: 25 ug/kg/min via INTRAVENOUS

## 2015-11-26 MED ORDER — METHYLENE BLUE 0.5 % INJ SOLN
INTRAVENOUS | Status: AC
  Filled 2015-11-26: qty 10

## 2015-11-26 SURGICAL SUPPLY — 73 items
BENZOIN TINCTURE PRP APPL 2/3 (GAUZE/BANDAGES/DRESSINGS) ×3 IMPLANT
BUR ROUND PRECISION 4.0 (BURR) ×2 IMPLANT
BUR ROUND PRECISION 4.0MM (BURR) ×1
CANISTER SUCTION 2500CC (MISCELLANEOUS) ×3 IMPLANT
CARTRIDGE OIL MAESTRO DRILL (MISCELLANEOUS) ×1 IMPLANT
CLOSURE WOUND 1/2 X4 (GAUZE/BANDAGES/DRESSINGS) ×1
CORDS BIPOLAR (ELECTRODE) ×3 IMPLANT
COVER SURGICAL LIGHT HANDLE (MISCELLANEOUS) ×6 IMPLANT
DIFFUSER DRILL AIR PNEUMATIC (MISCELLANEOUS) ×3 IMPLANT
DRAIN CHANNEL 15F RND FF W/TCR (WOUND CARE) IMPLANT
DRAPE POUCH INSTRU U-SHP 10X18 (DRAPES) ×6 IMPLANT
DRAPE SURG 17X23 STRL (DRAPES) ×12 IMPLANT
DURAPREP 26ML APPLICATOR (WOUND CARE) ×3 IMPLANT
ELECT BLADE 4.0 EZ CLEAN MEGAD (MISCELLANEOUS) ×3
ELECT CAUTERY BLADE 6.4 (BLADE) ×3 IMPLANT
ELECT REM PT RETURN 9FT ADLT (ELECTROSURGICAL) ×3
ELECTRODE BLDE 4.0 EZ CLN MEGD (MISCELLANEOUS) ×1 IMPLANT
ELECTRODE REM PT RTRN 9FT ADLT (ELECTROSURGICAL) ×1 IMPLANT
EVACUATOR SILICONE 100CC (DRAIN) IMPLANT
FILTER STRAW FLUID ASPIR (MISCELLANEOUS) ×3 IMPLANT
GAUZE SPONGE 4X4 12PLY STRL (GAUZE/BANDAGES/DRESSINGS) ×3 IMPLANT
GAUZE SPONGE 4X4 16PLY XRAY LF (GAUZE/BANDAGES/DRESSINGS) ×6 IMPLANT
GLOVE BIO SURGEON STRL SZ7 (GLOVE) ×3 IMPLANT
GLOVE BIO SURGEON STRL SZ8 (GLOVE) ×3 IMPLANT
GLOVE BIOGEL PI IND STRL 6 (GLOVE) ×1 IMPLANT
GLOVE BIOGEL PI IND STRL 7.0 (GLOVE) ×1 IMPLANT
GLOVE BIOGEL PI IND STRL 8 (GLOVE) ×1 IMPLANT
GLOVE BIOGEL PI INDICATOR 6 (GLOVE) ×2
GLOVE BIOGEL PI INDICATOR 7.0 (GLOVE) ×2
GLOVE BIOGEL PI INDICATOR 8 (GLOVE) ×2
GLOVE SURG SS PI 6.0 STRL IVOR (GLOVE) ×3 IMPLANT
GOWN STRL REUS W/ TWL LRG LVL3 (GOWN DISPOSABLE) ×1 IMPLANT
GOWN STRL REUS W/ TWL XL LVL3 (GOWN DISPOSABLE) ×3 IMPLANT
GOWN STRL REUS W/TWL LRG LVL3 (GOWN DISPOSABLE) ×2
GOWN STRL REUS W/TWL XL LVL3 (GOWN DISPOSABLE) ×6
IV CATH 14GX2 1/4 (CATHETERS) ×3 IMPLANT
KIT BASIN OR (CUSTOM PROCEDURE TRAY) ×3 IMPLANT
KIT POSITION SURG JACKSON T1 (MISCELLANEOUS) ×3 IMPLANT
KIT ROOM TURNOVER OR (KITS) ×3 IMPLANT
NEEDLE 18GX1X1/2 (RX/OR ONLY) (NEEDLE) ×3 IMPLANT
NEEDLE 22X1 1/2 (OR ONLY) (NEEDLE) ×3 IMPLANT
NEEDLE HYPO 25GX1X1/2 BEV (NEEDLE) ×3 IMPLANT
NEEDLE SPNL 18GX3.5 QUINCKE PK (NEEDLE) ×6 IMPLANT
NS IRRIG 1000ML POUR BTL (IV SOLUTION) ×3 IMPLANT
OIL CARTRIDGE MAESTRO DRILL (MISCELLANEOUS) ×3
PACK LAMINECTOMY ORTHO (CUSTOM PROCEDURE TRAY) ×3 IMPLANT
PACK UNIVERSAL I (CUSTOM PROCEDURE TRAY) ×3 IMPLANT
PAD ARMBOARD 7.5X6 YLW CONV (MISCELLANEOUS) ×6 IMPLANT
PATTIES SURGICAL .5 X.5 (GAUZE/BANDAGES/DRESSINGS) IMPLANT
PATTIES SURGICAL .5 X1 (DISPOSABLE) ×3 IMPLANT
SPONGE INTESTINAL PEANUT (DISPOSABLE) ×3 IMPLANT
SPONGE SURGIFOAM ABS GEL 100 (HEMOSTASIS) ×3 IMPLANT
SPONGE SURGIFOAM ABS GEL SZ50 (HEMOSTASIS) ×3 IMPLANT
STRIP CLOSURE SKIN 1/2X4 (GAUZE/BANDAGES/DRESSINGS) ×2 IMPLANT
SURGIFLO W/THROMBIN 8M KIT (HEMOSTASIS) IMPLANT
SUT MNCRL AB 4-0 PS2 18 (SUTURE) ×3 IMPLANT
SUT VIC AB 0 CT1 18XCR BRD 8 (SUTURE) IMPLANT
SUT VIC AB 0 CT1 27 (SUTURE)
SUT VIC AB 0 CT1 27XBRD ANBCTR (SUTURE) IMPLANT
SUT VIC AB 0 CT1 8-18 (SUTURE)
SUT VIC AB 1 CT1 18XCR BRD 8 (SUTURE) ×1 IMPLANT
SUT VIC AB 1 CT1 8-18 (SUTURE) ×2
SUT VIC AB 2-0 CT2 18 VCP726D (SUTURE) ×3 IMPLANT
SYR 20CC LL (SYRINGE) ×3 IMPLANT
SYR BULB IRRIGATION 50ML (SYRINGE) ×3 IMPLANT
SYR CONTROL 10ML LL (SYRINGE) ×6 IMPLANT
SYR TB 1ML 26GX3/8 SAFETY (SYRINGE) ×6 IMPLANT
SYR TB 1ML LUER SLIP (SYRINGE) ×6 IMPLANT
TAPE CLOTH SURG 4X10 WHT LF (GAUZE/BANDAGES/DRESSINGS) ×3 IMPLANT
TOWEL OR 17X24 6PK STRL BLUE (TOWEL DISPOSABLE) ×3 IMPLANT
TOWEL OR 17X26 10 PK STRL BLUE (TOWEL DISPOSABLE) ×3 IMPLANT
WATER STERILE IRR 1000ML POUR (IV SOLUTION) ×3 IMPLANT
YANKAUER SUCT BULB TIP NO VENT (SUCTIONS) ×3 IMPLANT

## 2015-11-26 NOTE — Anesthesia Postprocedure Evaluation (Signed)
Anesthesia Post Note  Patient: Toni Jordan  Procedure(s) Performed: Procedure(s) (LRB): RIGHT SIDED LUMBAR 5-SACRUM 1 MICRODISCECTOMY (Right)  Patient location during evaluation: PACU Anesthesia Type: General Level of consciousness: awake and alert Pain management: pain level controlled Vital Signs Assessment: post-procedure vital signs reviewed and stable Respiratory status: spontaneous breathing, nonlabored ventilation, respiratory function stable and patient connected to nasal cannula oxygen Cardiovascular status: blood pressure returned to baseline and stable Postop Assessment: no signs of nausea or vomiting Anesthetic complications: no    Last Vitals:  Filed Vitals:   11/26/15 1500 11/26/15 1504  BP:    Pulse: 78   Temp:  36.9 C  Resp: 9     Last Pain:  Filed Vitals:   11/26/15 1506  PainSc: 5                  Shelton SilvasKevin D Jeshurun [place]

## 2015-11-26 NOTE — Transfer of Care (Signed)
Immediate Anesthesia Transfer of Care Note  Patient: Toni Jordan  Procedure(s) Performed: Procedure(s) with comments: RIGHT SIDED LUMBAR 5-SACRUM 1 MICRODISCECTOMY (Right) - RIGHT SIDED LUMBAR 5-SACRUM 1 MICRODISCECTOMY  Patient Location: PACU  Anesthesia Type:General  Level of Consciousness: awake, alert  and patient cooperative  Airway & Oxygen Therapy: Patient Spontanous Breathing and Patient connected to nasal cannula oxygen  Post-op Assessment: Report given to RN, Post -op Vital signs reviewed and stable and Patient moving all extremities X 4  Post vital signs: Reviewed and stable  Last Vitals:  Filed Vitals:   11/26/15 0841  BP: 148/90  Pulse: 58  Temp: 36.7 C  Resp: 18    Last Pain:  Filed Vitals:   11/26/15 0842  PainSc: 5       Patients Stated Pain Goal: 3 (11/26/15 0841)  Complications: No apparent anesthesia complications

## 2015-11-26 NOTE — Anesthesia Preprocedure Evaluation (Addendum)
Anesthesia Evaluation  Patient identified by MRN, date of birth, ID band Patient awake    Reviewed: Allergy & Precautions, NPO status , Patient's Chart, lab work & pertinent test results  Airway Mallampati: II  TM Distance: >3 FB   Mouth opening: Limited Mouth Opening  Dental  (+) Teeth Intact, Dental Advisory Given   Pulmonary neg pulmonary ROS,    breath sounds clear to auscultation       Cardiovascular negative cardio ROS   Rhythm:Regular Rate:Normal     Neuro/Psych    GI/Hepatic negative GI ROS, Neg liver ROS,   Endo/Other  negative endocrine ROS  Renal/GU negative Renal ROS  negative genitourinary   Musculoskeletal negative musculoskeletal ROS (+)   Abdominal   Peds negative pediatric ROS (+)  Hematology negative hematology ROS (+)   Anesthesia Other Findings   Reproductive/Obstetrics negative OB ROS                           Lab Results  Component Value Date   WBC 12.6* 11/26/2015   HGB 13.5 11/26/2015   HCT 40.7 11/26/2015   MCV 83.7 11/26/2015   PLT 290 11/26/2015   No results found for: CREATININE, BUN, NA, K, CL, CO2  Anesthesia Physical Anesthesia Plan  ASA: I  Anesthesia Plan: General   Post-op Pain Management:    Induction: Intravenous  Airway Management Planned: Oral ETT  Additional Equipment:   Intra-op Plan:   Post-operative Plan: Extubation in OR  Informed Consent: I have reviewed the patients History and Physical, chart, labs and discussed the procedure including the risks, benefits and alternatives for the proposed anesthesia with the patient or authorized representative who has indicated his/her understanding and acceptance.   Dental advisory given  Plan Discussed with: CRNA  Anesthesia Plan Comments:         Anesthesia Quick Evaluation

## 2015-11-26 NOTE — Anesthesia Procedure Notes (Signed)
Procedure Name: Intubation Date/Time: 11/26/2015 11:06 AM Performed by: Salomon MastWALL, Amariyana Heacox COREY Pre-anesthesia Checklist: Patient identified, Emergency Drugs available, Suction available and Patient being monitored Patient Re-evaluated:Patient Re-evaluated prior to inductionOxygen Delivery Method: Circle system utilized Preoxygenation: Pre-oxygenation with 100% oxygen Intubation Type: IV induction Laryngoscope Size: Mac and 3 Grade View: Grade II Tube type: Oral Tube size: 7.0 mm Number of attempts: 1 Airway Equipment and Method: Stylet Placement Confirmation: ETT inserted through vocal cords under direct vision,  positive ETCO2 and breath sounds checked- equal and bilateral Secured at: 22 cm Tube secured with: Tape Dental Injury: Teeth and Oropharynx as per pre-operative assessment

## 2015-11-27 ENCOUNTER — Encounter (HOSPITAL_COMMUNITY): Payer: Self-pay | Admitting: Orthopedic Surgery

## 2015-11-27 NOTE — Op Note (Signed)
NAMBryan Lemma:  Barcellos, Lonnie                  ACCOUNT NO.:  [id number]  MEDICAL RECORD NO.:  [id number]  LOCATION:  [facility]                         FACILITY:  [facility]  PHYSICIAN:  Estill BambergMark Yoshio Seliga, MD      DATE OF BIRTH:  Jul 10, 1975  DATE OF PROCEDURE:  11/26/2015 DATE OF DISCHARGE:  11/26/2015                              OPERATIVE REPORT   PREOPERATIVE DIAGNOSES: 1. Right leg pain. 2. Right S1 radiculopathy. 3. Very large right L5-S1 disk herniation.  POSTOPERATIVE DIAGNOSES: 1. Right leg pain. 2. Right S1 radiculopathy. 3. Very large right L5-S1 disk herniation.  PROCEDURE:  Right-sided L5-S1 laminotomy with partial facetectomy with removal of very large extruded L5-S1 intervertebral disk herniation.  SURGEON:  Estill BambergMark Alyannah Sanks, MD.  ASSISTANJason Coop:  Kayla McKenzie, PA-C.  ANESTHESIA:  General endotracheal anesthesia.  COMPLICATIONS:  None.  DISPOSITION:  Stable.  ESTIMATED BLOOD LOSS:  Minimal.  INDICATIONS FOR SURGERY:  Briefly, Ms. Toni Jordan is a pleasant 40 year old female, who did present to me with a 2598-month history of severe ongoing pain in the right leg.  An MRI did clearly reveal a very large central/right-sided L5-S1 disk herniation, resulting in significant right leg pain.  The patient did have appropriate interventions, but did continue to have ongoing pain.  Given the patient's ongoing pain and lack of improvement with appropriate conservative treatment measures, we did discuss proceeding with the procedure noted above.  The patient was fully aware of the risks and limitations of the procedure.  Of particular note, she was aware of the risk of a recurrent disk herniation.  I did feel that her risk of recurrent herniation was significantly higher than a typical patient after having had a microdiskectomy, given the large broad-based nature of the herniation. She did elect to proceed.  OPERATIVE DETAILS:  On November 26, 2015, the patient was brought to surgery and general endotracheal  anesthesia was administered.  The patient was placed prone on a well-padded flat Jackson bed with a Wilson frame. Antibiotics were given, and a time-out procedure was performed.  The back was prepped and draped in the usual sterile fashion.  A midline incision was then made overlying the L5-S1 intervertebral space.  The fascia was incised in a curvilinear fashion.  The lamina of L5 and S1 was identified and subperiosteally exposed.  A self-retaining retractor was placed.  I then removed the medial and inferior aspect of the L5 lamina.  The ligamentum flavum was identified and removed.  The traversing right S1 nerve was identified and noted to be under substantial and very significant tension.  I was able to gently retract the nerve medially.  There was a very large prominence noted immediately ventral to the nerve.  I did tease away superficial layers overlying the intervertebral disk herniation.  There were a few small fragments that were identified and removed.  However, it was notable that there did continue to be compression of the nerve.  With an assistant holding medial retraction of the nerve, I did work within the annulus of the disc and did displace multiple substantial disc fragments into the intervertebral space, after which point, they were removed using a series of straight and forward  biting pituitary rongeurs.  At the termination of the procedure, I was pleased with the decompression of the nerve.  There were more chronic appearing central protrusions adherent to the annulus, which I did not feel that were causing any substantial compression.  I did not feel there was any additional gain in removing these particular fragment, as they clearly were more chronic appearing, and I did not wish to disrupt the posterior annulus anymore than it already was.  At this point, I did liberally irrigate the wound. Bleeding was controlled using bipolar electrocautery in addition  to Surgiflo.  The wound was then closed in layers using #1 Vicryl, followed by 2-0 Vicryl, followed by 4-0 Monocryl.  Benzoin and Steri-Strips were applied, followed by sterile dressing.  All instrument counts were correct at the termination of the procedure.  Of note, Jason Coop was my assistant throughout surgery, and did aid in retraction, suctioning, and closure from start to finish.     Estill Bamberg, MD     MD/MEDQ  D:  11/26/2015  T:  11/27/2015  Job:  [id number]

## 2015-12-01 ENCOUNTER — Other Ambulatory Visit: Payer: BLUE CROSS/BLUE SHIELD

## 2016-01-21 DIAGNOSIS — Z09 Encounter for follow-up examination after completed treatment for conditions other than malignant neoplasm: Secondary | ICD-10-CM

## 2016-01-21 NOTE — Congregational Nurse Program (Signed)
Congregational Nurse Program Note  Date of Encounter: 01/21/2016  Past Medical History: Past Medical History:  Diagnosis Date  . Tuberculosis    treated for 3 months  . Vaginal delivery 2000,2005,1998,1995    Encounter Details:     CNP Questionnaire - 01/21/16 0946      Patient Demographics   Is this a new or existing patient? New   Patient is considered a/an Refugee   Race Asian     Patient Assistance   Location of Patient Assistance [employer]   Patient's financial/insurance status Low Income   Uninsured Patient No  I had a great deal of difficulty assessing if she has insurance. I think she has some coverage under the ACA   Patient referred to apply for the following financial assistance Not Applicable   Food insecurities addressed Not Applicable   Transportation assistance No   Assistance securing medications No   Educational health offerings Exercise/physical activity;Medications;Nutrition;Safety     Encounter Details   Primary purpose of visit Acute Illness/Condition Visit   Was an Emergency Department visit averted? Not Applicable   Does patient have a medical provider? Yes   Patient referred to Follow up with established PCP   Was a mental health screening completed? (GAINS tool) No   Does patient have dental issues? No   Does patient have vision issues? No   Does your patient have an abnormal blood pressure today? No   Since previous encounter, have you referred patient for abnormal blood pressure that resulted in a new diagnosis or medication change? No   Does your patient have an abnormal blood glucose today? No   Since previous encounter, have you referred patient for abnormal blood glucose that resulted in a new diagnosis or medication change? No   Was there a life-saving intervention made? No         Amb Nursing Assessment - 01/21/16 0939      Pre-visit preparation   Pre-visit preparation completed No     Pain Assessment   Pain  Assessment Faces   Faces Pain Scale Hurts a little bit   Pain Type Other (Comment)   Pain Location Back   Pain Orientation Right   Pain Radiating Towards to right hip area   Pain Descriptors / Indicators Aching   Pain Frequency Intermittent   Effect of Pain on Daily Activities Pt is post procedure. Only complaint was fatigue and aching starting at treatment site and moving to the right hip     Nutrition Screen   Nutritional Risks None   Diabetes No     Functional Status   Activities of Daily Living Independent   Ambulation Independent   Medication Administration Independent   Home Management Independent     Risk/Barriers  Assessment   Barriers to Care Management & Learning Language   Psychosocial Barriers Financial need     Abuse/Neglect Assessment   Do you feel unsafe in your current relationship? No   Do you feel physically threatened by others? No   Anyone hurting you at home, work, or school? No   Unable to ask? No   Information provided on Community resources Other (comment)     Patient Literacy   How often do you need to have someone help you when you read instructions, pamphlets, or other written materials from your doctor or pharmacy? 3 - Sometimes   What is the last grade level you completed in school? unknown, not asked     Product manager  Interpreter Needed? Yes   Interpreter Agency none   Interpreter Name Rubey   Interpreter ID n/a   Patient Declined Interpreter  No   Patient signed [facility] waiver No     Comments   Comments Pt came to [place] due to procedure to the back. We did go through the screening since I had not seen her in many months. Her English to understand is fair. To communicate is limited. She is not really able to explain what was done for her chronic back pain and impairments   Information entered by : Arie Sabina RN [facility]     Adylene Capuano has been working and I have not seen her in many months. She had me assess  her treatment site to the low back. But was not able to express what had been done. Was able to tell me her symptoms prior to treatment. Site is clean, dry and intact and appears healed. She is wearing a brace when ambulating. Pain levels and descriptions listed. Has follow up appointment and states will comply. Denies any pain medication use at this time. Maren Reamer RN, Congregational Nurse

## 2016-04-20 IMAGING — MR MR LUMBAR SPINE W/O CM
5 series · 38 of 48 positions shown · non-contrast
Comparison: Prior radiograph from 08/13/2015.

CLINICAL DATA: Initial evaluation for two-month history of sided
low back pain. Right-sided sciatica. No injury.

EXAM:
MRI LUMBAR SPINE WITHOUT CONTRAST
TECHNIQUE: Multiplanar, multisequence MR imaging of the lumbar spine was
performed. No intravenous contrast was administered.

[Series 3: tirm sag · sagittal · 4.0mm · 0.55mm/px · 7 of 13 slices shown]
[im 1/13]
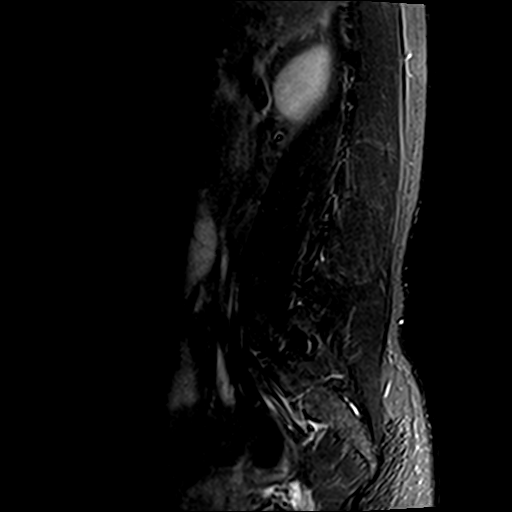
[im 3/13]
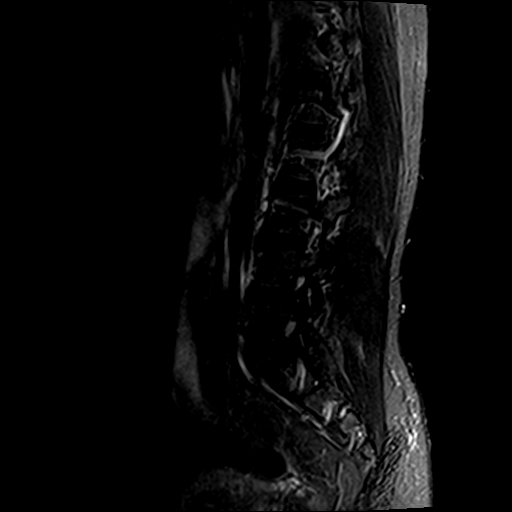
[im 5/13]
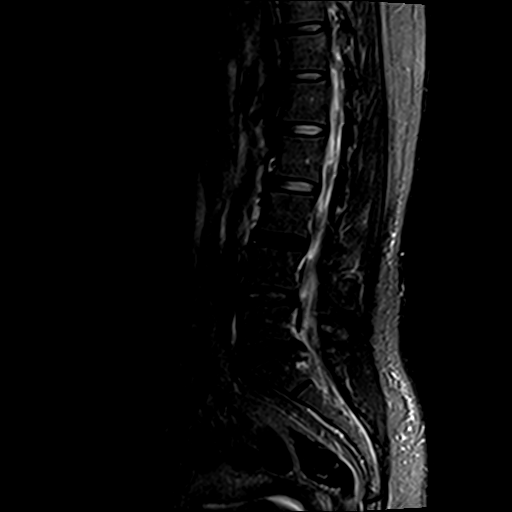
[im 7/13]
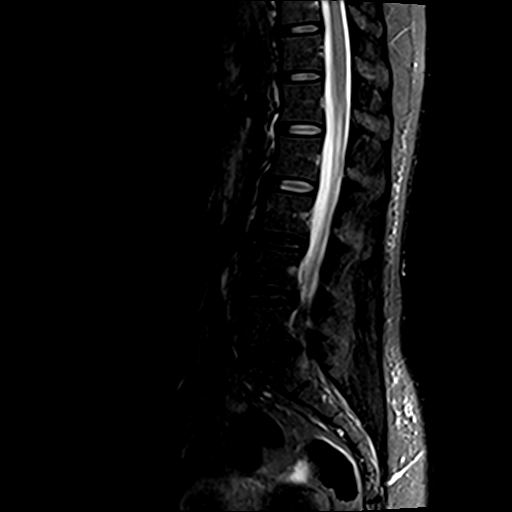
[im 9/13]
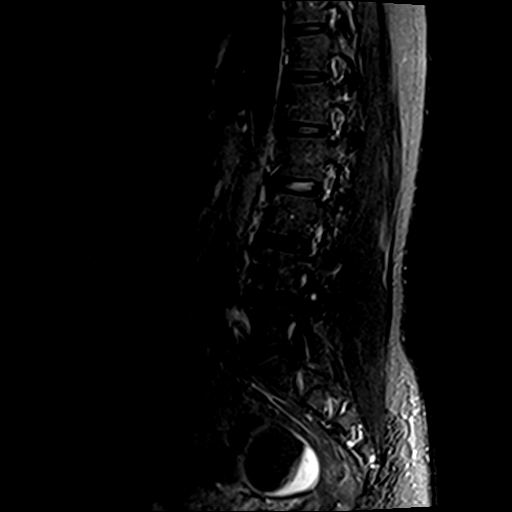
[im 11/13]
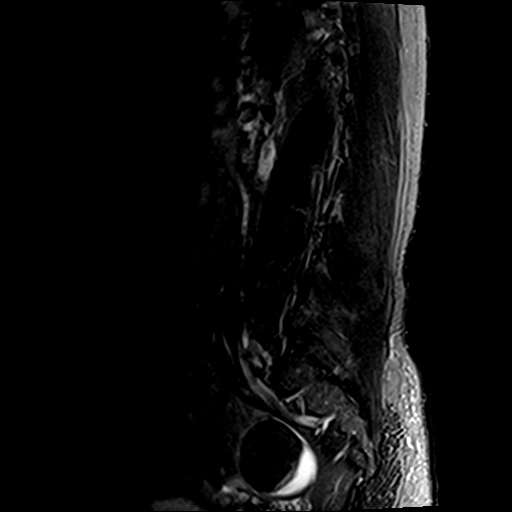
[im 13/13]
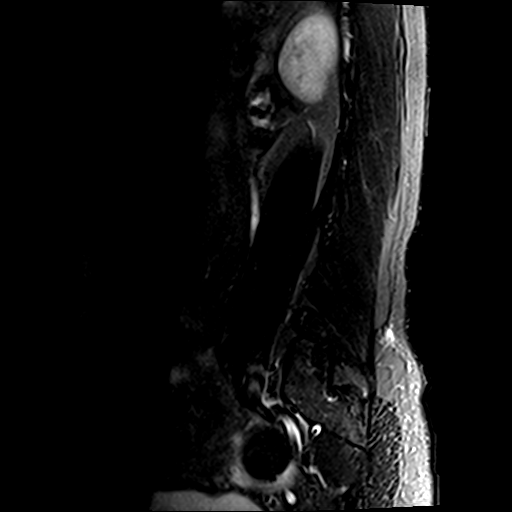

[Series 4: T2 · sagittal · 4.0mm · 0.88mm/px · 7 of 13 slices shown (1 of 2)]
[im 1/13]
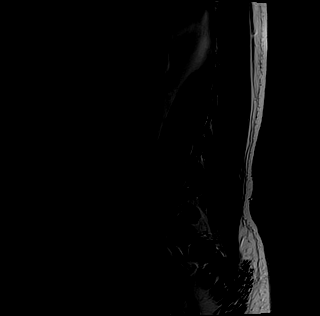
[im 3/13]
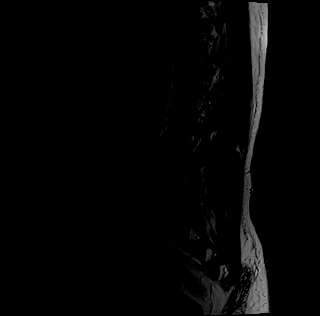
[im 5/13]
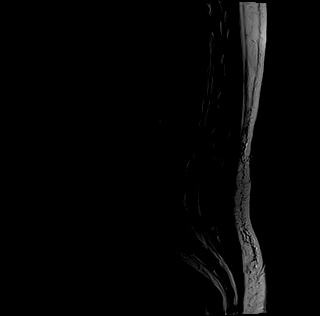
[im 7/13]
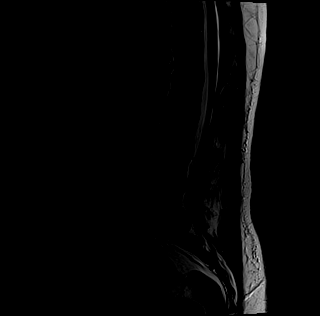
[im 9/13]
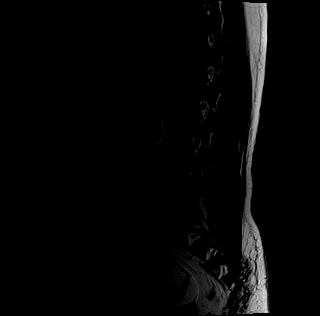
[im 11/13]
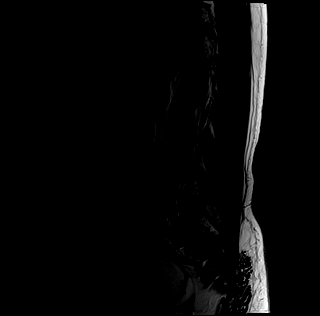
[im 13/13]
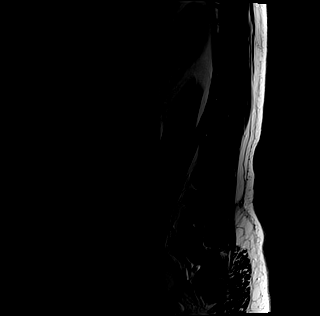

[Series 5: T1 · sagittal · 4.0mm · 0.88mm/px · 6 of 13 slices shown (1 of 2)]
[im 1/13]
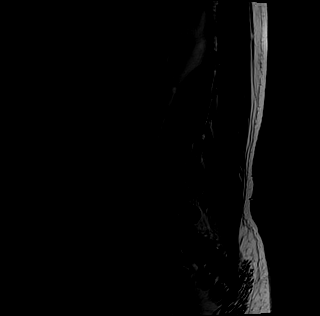
[im 3/13]
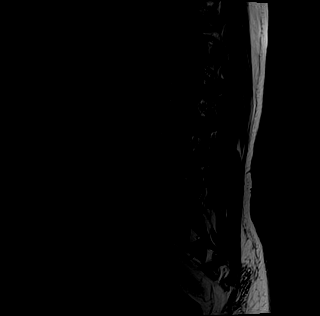
[im 5/13]
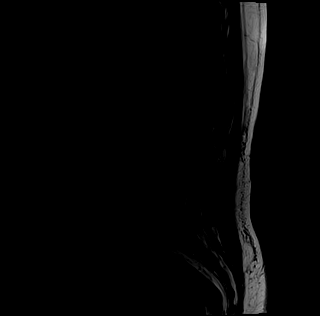
[im 8/13]
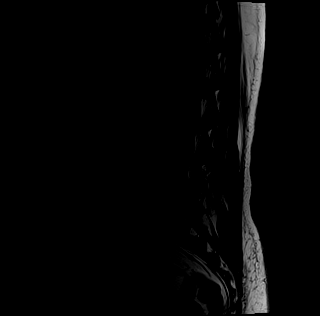
[im 10/13]
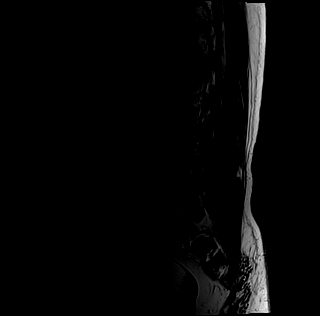
[im 13/13]
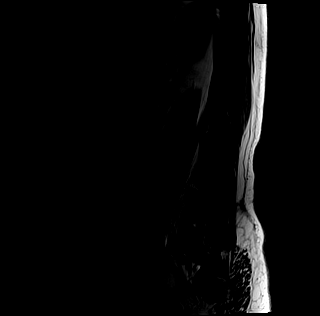

[Series 6: T1 · axial · 4.0mm · 0.70mm/px · z∈[+3,+160]mm · 8 of 29 slices shown (2 of 2)]
[im 1/29]
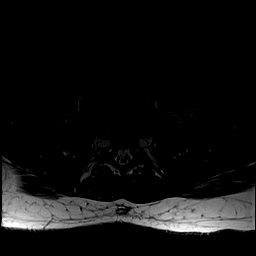
[im 5/29]
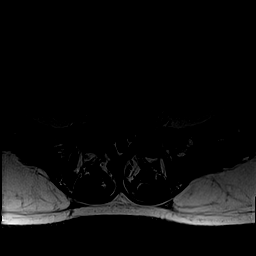
[im 9/29]
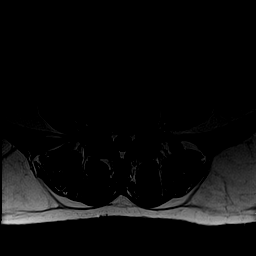
[im 13/29]
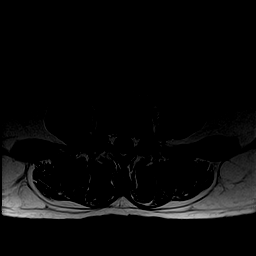
[im 16/29]
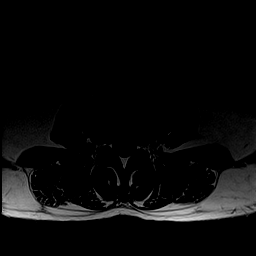
[im 20/29]
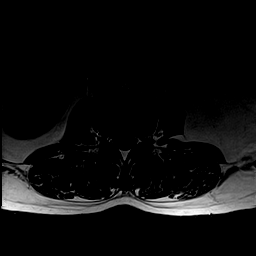
[im 24/29]
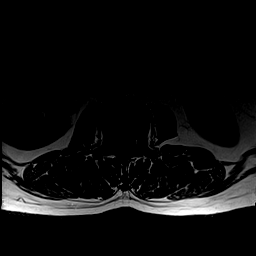
[im 29/29]
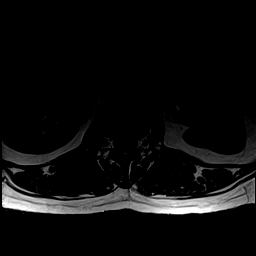

[Series 7: T2 · axial · 4.0mm · 0.70mm/px · z∈[+3,+160]mm · 10 of 29 slices shown (2 of 2)]
[im 1/29]
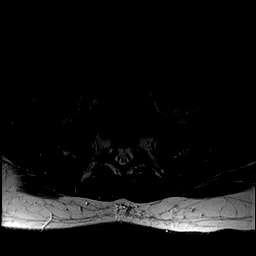
[im 3/29]
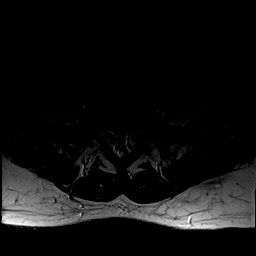
[im 5/29]
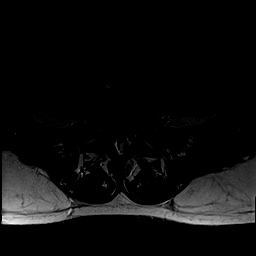
[im 7/29]
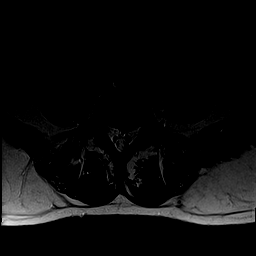
[im 9/29]
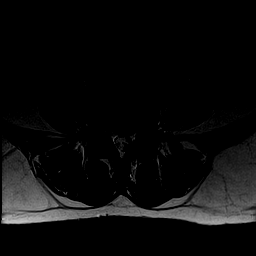
[im 13/29]
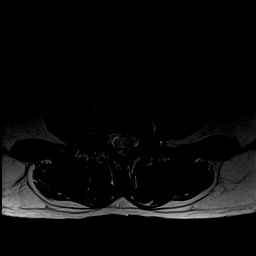
[im 16/29]
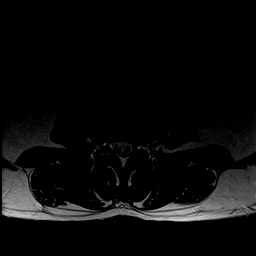
[im 20/29]
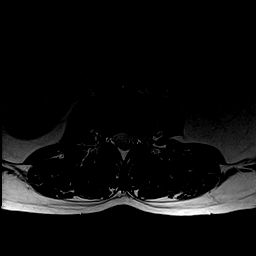
[im 24/29]
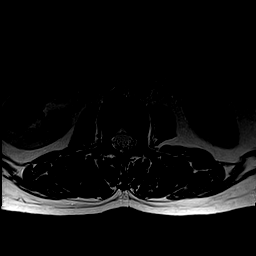
[im 29/29]
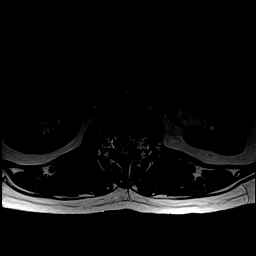

[38 of 48 positions shown; findings below may reference images not displayed]

FINDINGS: For the purposes of this dictation, the lowest well-formed
intervertebral disc spaces presumed to be the L5-S1 level, and there
presumed to be 5 lumbar type vertebral bodies.

Mild levoscoliosis of the lumbar spine. Vertebral bodies otherwise
normally aligned with preservation of the normal lumbar lordosis.
Vertebral body heights are well maintained. No evidence for fracture
or listhesis. Signal intensity within the vertebral body bone marrow
is normal. No marrow edema.

Conus medullaris terminates normally at the L1 level. Signal
intensity within the visualized cord is normal. Nerve roots of the
cauda equina within normal limits.

Paraspinous soft tissues demonstrate no acute abnormality. 3.9 x
cm complex left adnexal cystic lead with layering debris likely
reflects 80 left ovarian cyst, likely hemorrhagic. Remainder the
visualized visceral structures are unremarkable. No retroperitoneal
adenopathy.

L1-2:  Negative.

L2-3:  Negative.

L3-4: Mild disc desiccation with minimal disc bulge. No focal disc
herniation. No significant canal or foraminal stenosis.

L4-5: Small central disc protrusion indents the ventral thecal sac
without significant canal stenosis or neural impingement.
Superimposed mild facet and ligamentum flavum hypertrophy. No
significant foraminal narrowing.

L5-S1: Right subarticular disc extrusion encroaching upon the right
lateral recess (series 4, image 5). There is associated caudad
angulation with thinning annular fissure. Extruded disc impinges
upon the right S1 nerve root which is slightly posterior displaced
as it courses through the right lateral recess. Possible impingement
upon the S2 nerve root as well. Mild left lateral recess stenosis as
well. There is superimposed mild facet hypertrophy at this level. No
significant canal stenosis. Mild bilateral foraminal narrowing.
IMPRESSION: 1. Right subarticular disc extrusion at L5-S1, impinging upon the
transiting right S1 nerve root as it courses through the right
lateral recess. Possible impingement of the right S2 nerve root as
well.
2. Small central disc protrusion at L4-5 without stenosis or neural
impingement.
3. Mild degenerative disc bulge at L3-4 without stenosis.
4. 3.9 x 4.5 cm complex left adnexal cyst, likely a hemorrhagic left
ovarian cyst. This is incompletely evaluated on this exam.

## 2016-04-26 ENCOUNTER — Ambulatory Visit: Payer: BLUE CROSS/BLUE SHIELD | Admitting: Internal Medicine

## 2016-04-30 ENCOUNTER — Ambulatory Visit: Payer: BLUE CROSS/BLUE SHIELD | Attending: Internal Medicine | Admitting: Family Medicine

## 2016-04-30 ENCOUNTER — Encounter: Payer: Self-pay | Admitting: Family Medicine

## 2016-04-30 VITALS — BP 158/77 | HR 65 | Temp 97.7°F | Ht 59.0 in | Wt 148.0 lb

## 2016-04-30 DIAGNOSIS — M25521 Pain in right elbow: Secondary | ICD-10-CM | POA: Diagnosis not present

## 2016-04-30 DIAGNOSIS — Z9889 Other specified postprocedural states: Secondary | ICD-10-CM | POA: Insufficient documentation

## 2016-04-30 DIAGNOSIS — R030 Elevated blood-pressure reading, without diagnosis of hypertension: Secondary | ICD-10-CM | POA: Insufficient documentation

## 2016-04-30 DIAGNOSIS — M25522 Pain in left elbow: Secondary | ICD-10-CM | POA: Diagnosis present

## 2016-04-30 DIAGNOSIS — Z8611 Personal history of tuberculosis: Secondary | ICD-10-CM | POA: Insufficient documentation

## 2016-04-30 DIAGNOSIS — R079 Chest pain, unspecified: Secondary | ICD-10-CM | POA: Diagnosis present

## 2016-04-30 MED ORDER — MELOXICAM 7.5 MG PO TABS: 7.5000 mg | ORAL_TABLET | Freq: Every day | ORAL | 1 refills | Status: DC

## 2016-04-30 NOTE — Patient Instructions (Signed)
Hypertension Hypertension, commonly called high blood pressure, is when the force of blood pumping through your arteries is too strong. Your arteries are the blood vessels that carry blood from your heart throughout your body. A blood pressure reading consists of a higher number over a lower number, such as 110/72. The higher number (systolic) is the pressure inside your arteries when your heart pumps. The lower number (diastolic) is the pressure inside your arteries when your heart relaxes. Ideally you want your blood pressure below 120/80. Hypertension forces your heart to work harder to pump blood. Your arteries may become narrow or stiff. Having untreated or uncontrolled hypertension can cause heart attack, stroke, kidney disease, and other problems. RISK FACTORS Some risk factors for high blood pressure are controllable. Others are not.  Risk factors you cannot control include:   Race. You may be at higher risk if you are African American.  Age. Risk increases with age.  Gender. Men are at higher risk than women before age 45 years. After age 65, women are at higher risk than men. Risk factors you can control include:  Not getting enough exercise or physical activity.  Being overweight.  Getting too much fat, sugar, calories, or salt in your diet.  Drinking too much alcohol. SIGNS AND SYMPTOMS Hypertension does not usually cause signs or symptoms. Extremely high blood pressure (hypertensive crisis) may cause headache, anxiety, shortness of breath, and nosebleed. DIAGNOSIS To check if you have hypertension, your health care provider will measure your blood pressure while you are seated, with your arm held at the level of your heart. It should be measured at least twice using the same arm. Certain conditions can cause a difference in blood pressure between your right and left arms. A blood pressure reading that is higher than normal on one occasion does not mean that you need treatment. If  it is not clear whether you have high blood pressure, you may be asked to return on a different day to have your blood pressure checked again. Or, you may be asked to monitor your blood pressure at home for 1 or more weeks. TREATMENT Treating high blood pressure includes making lifestyle changes and possibly taking medicine. Living a healthy lifestyle can help lower high blood pressure. You may need to change some of your habits. Lifestyle changes may include:  Following the DASH diet. This diet is high in fruits, vegetables, and whole grains. It is low in salt, red meat, and added sugars.  Keep your sodium intake below 2,300 mg per day.  Getting at least 30-45 minutes of aerobic exercise at least 4 times per week.  Losing weight if necessary.  Not smoking.  Limiting alcoholic beverages.  Learning ways to reduce stress. Your health care provider may prescribe medicine if lifestyle changes are not enough to get your blood pressure under control, and if one of the following is true:  You are 18-59 years of age and your systolic blood pressure is above 140.  You are 60 years of age or older, and your systolic blood pressure is above 150.  Your diastolic blood pressure is above 90.  You have diabetes, and your systolic blood pressure is over 140 or your diastolic blood pressure is over 90.  You have kidney disease and your blood pressure is above 140/90.  You have heart disease and your blood pressure is above 140/90. Your personal target blood pressure may vary depending on your medical conditions, your age, and other factors. HOME CARE INSTRUCTIONS    Have your blood pressure rechecked as directed by your health care provider.   Take medicines only as directed by your health care provider. Follow the directions carefully. Blood pressure medicines must be taken as prescribed. The medicine does not work as well when you skip doses. Skipping doses also puts you at risk for  problems.  Do not smoke.   Monitor your blood pressure at home as directed by your health care provider. SEEK MEDICAL CARE IF:   You think you are having a reaction to medicines taken.  You have recurrent headaches or feel dizzy.  You have swelling in your ankles.  You have trouble with your vision. SEEK IMMEDIATE MEDICAL CARE IF:  You develop a severe headache or confusion.  You have unusual weakness, numbness, or feel faint.  You have severe chest or abdominal pain.  You vomit repeatedly.  You have trouble breathing. MAKE SURE YOU:   Understand these instructions.  Will watch your condition.  Will get help right away if you are not doing well or get worse.   This information is not intended to replace advice given to you by your health care provider. Make sure you discuss any questions you have with your health care provider.   Document Released: 06/07/2005 Document Revised: 10/22/2014 Document Reviewed: 03/30/2013 Elsevier Interactive Patient Education 2016 Elsevier Inc.  

## 2016-04-30 NOTE — Progress Notes (Signed)
Subjective:  Patient ID: Toni Jordan, female    DOB: 12/21/1975  Age: 40 y.o. MRN: [id number]  CC: Chest Pain (stopped hurting one week ago)   HPI Toni Jordan presents Complaining of pain on the lateral aspects of both elbows ever since she had her surgery in 11/2015. 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 She denies history of trauma to her elbows and has no swelling.  Her blood pressure is elevated and she denies a history of hypertension  Past Medical History:  Diagnosis Date  . Tuberculosis    treated for 3 months  . Vaginal delivery 2000,2005,1998,1995    Past Surgical History:  Procedure Laterality Date  . LAPAROSCOPIC BILATERAL SALPINGECTOMY Bilateral 09/24/2014   Procedure: LAPAROSCOPIC BILATERAL TUBAL LIGATION;  Surgeon: Catalina AntiguaPeggy Constant, MD;  Location: [facility];  Service: Gynecology;  Laterality: Bilateral;  . LUMBAR LAMINECTOMY/DECOMPRESSION MICRODISCECTOMY Right 11/26/2015   Procedure: RIGHT SIDED LUMBAR 5-SACRUM 1 MICRODISCECTOMY;  Surgeon: Estill BambergMark Dumonski, MD;  Location: [facility];  Service: Orthopedics;  Laterality: Right;  RIGHT SIDED LUMBAR 5-SACRUM 1 MICRODISCECTOMY    No Known Allergies    Outpatient Medications Prior to Visit  Medication Sig Dispense Refill  . nortriptyline (PAMELOR) 50 MG capsule Take 1 capsule (50 mg total) by mouth at bedtime. (Patient not taking: Reported on 04/30/2016) 30 capsule 1   No facility-administered medications prior to visit.     ROS Review of Systems  Constitutional: Negative for activity change and appetite change.  HENT: Negative for sinus pressure and sore throat.   Respiratory: Negative for chest tightness, shortness of breath and wheezing.   Cardiovascular: Negative for chest pain and palpitations.  Gastrointestinal: Negative for abdominal distention, abdominal  pain and constipation.  Genitourinary: Negative.   Musculoskeletal:       See history of present illness  Psychiatric/Behavioral: Negative for behavioral problems and dysphoric mood.    Objective:  BP (!) 158/77 (BP Location: Right Arm, Patient Position: Sitting, Cuff Size: Small)   Pulse 65   Temp 97.7 F (36.5 C) (Oral)   Ht 4\' 11"  (1.499 m)   Wt 148 lb (67.1 kg)   SpO2 99%   BMI 29.89 kg/m   BP/Weight 04/30/2016 01/21/2016 11/26/2015  Systolic BP 158 122 121  Diastolic BP 77 68 80  Wt. (Lbs) 148 - 140  BMI 29.89 - 27.34      Physical Exam  Constitutional: She is oriented to person, place, and time. She appears well-developed and well-nourished.  Cardiovascular: Normal rate, normal heart sounds and intact distal pulses.   No murmur heard. Pulmonary/Chest: Effort normal and breath sounds normal. She has no wheezes. She has no rales. She exhibits no tenderness.  Abdominal: Soft. Bowel sounds are normal. She exhibits no distension and no mass. There is no tenderness.  Musculoskeletal: Normal range of motion.  Neurological: She is alert and oriented to person, place, and time.     Assessment & Plan:   1. Elevated blood pressure reading Advised and low-sodium diet, weight loss We'll hold off on initiating antihypertensive and will reassess at next visit  2. Pain of both elbows Will need to exclude lateral epicondylitis - meloxicam (MOBIC) 7.5 MG tablet; Take 1 tablet (7.5 mg total) by mouth daily.  Dispense: 30 tablet; Refill: 1   Meds ordered this encounter  Medications  . meloxicam (MOBIC) 7.5 MG tablet  Sig: Take 1 tablet (7.5 mg total) by mouth daily.    Dispense:  30 tablet    Refill:  1    Follow-up: Return in about 1 month (around 05/30/2016) for follow up on elevated blood pressure.   Jaclyn ShaggyEnobong Amao MD

## 2016-07-09 ENCOUNTER — Ambulatory Visit: Payer: BLUE CROSS/BLUE SHIELD | Admitting: Family Medicine

## 2016-09-01 ENCOUNTER — Encounter: Payer: Self-pay | Admitting: Family Medicine

## 2016-09-01 ENCOUNTER — Ambulatory Visit: Payer: BLUE CROSS/BLUE SHIELD | Attending: Family Medicine | Admitting: Family Medicine

## 2016-09-01 VITALS — BP 140/80 | HR 60 | Temp 97.5°F | Ht 59.0 in | Wt 141.4 lb

## 2016-09-01 DIAGNOSIS — M5412 Radiculopathy, cervical region: Secondary | ICD-10-CM | POA: Insufficient documentation

## 2016-09-01 DIAGNOSIS — M25511 Pain in right shoulder: Secondary | ICD-10-CM | POA: Diagnosis present

## 2016-09-01 DIAGNOSIS — M62838 Other muscle spasm: Secondary | ICD-10-CM | POA: Diagnosis not present

## 2016-09-01 DIAGNOSIS — I10 Essential (primary) hypertension: Secondary | ICD-10-CM | POA: Insufficient documentation

## 2016-09-01 DIAGNOSIS — Z9889 Other specified postprocedural states: Secondary | ICD-10-CM | POA: Insufficient documentation

## 2016-09-01 DIAGNOSIS — R200 Anesthesia of skin: Secondary | ICD-10-CM | POA: Diagnosis present

## 2016-09-01 DIAGNOSIS — M25521 Pain in right elbow: Secondary | ICD-10-CM | POA: Diagnosis present

## 2016-09-01 MED ORDER — METHOCARBAMOL 500 MG PO TABS: 500.0000 mg | ORAL_TABLET | Freq: Two times a day (BID) | ORAL | 1 refills | Status: DC

## 2016-09-01 MED ORDER — GABAPENTIN 300 MG PO CAPS: 300.0000 mg | ORAL_CAPSULE | Freq: Two times a day (BID) | ORAL | 1 refills | Status: DC

## 2016-09-01 MED ORDER — LISINOPRIL-HYDROCHLOROTHIAZIDE 10-12.5 MG PO TABS: 1.0000 | ORAL_TABLET | Freq: Every day | ORAL | 3 refills | Status: DC

## 2016-09-01 NOTE — Progress Notes (Signed)
Subjective:  Patient ID: Toni Jordan, female    DOB: 1976/04/13  Age: 41 y.o. 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  Pain is worse at night when she sleeps and radiates from her neck down her arms to her hands bilaterally and sometimes makes it difficult to lift things. She has associated tingling. Also complains of right-sided neck pain  Her blood pressure is elevated and was 158/77 at her last office visit and she currently does not take antihypertensives.  Past Medical History:  Diagnosis Date  . Tuberculosis    treated for 3 months  . Vaginal delivery 2000,2005,1998,1995    Past Surgical History:  Procedure Laterality Date  . LAPAROSCOPIC BILATERAL SALPINGECTOMY Bilateral 09/24/2014   Procedure: LAPAROSCOPIC BILATERAL TUBAL LIGATION;  Surgeon: Catalina Antigua, MD;  Location: [facility];  Service: Gynecology;  Laterality: Bilateral;  . LUMBAR LAMINECTOMY/DECOMPRESSION MICRODISCECTOMY Right 11/26/2015   Procedure: RIGHT SIDED LUMBAR 5-SACRUM 1 MICRODISCECTOMY;  Surgeon: Estill Bamberg, MD;  Location: [facility];  Service: Orthopedics;  Laterality: Right;  RIGHT SIDED LUMBAR 5-SACRUM 1 MICRODISCECTOMY    No Known Allergies   Outpatient Medications Prior to Visit  Medication Sig Dispense Refill  . meloxicam (MOBIC) 7.5 MG tablet Take 1 tablet (7.5 mg total) by mouth daily. (Patient not taking: Reported on 09/01/2016) 30 tablet 1  . nortriptyline (PAMELOR) 50 MG capsule Take 1 capsule (50 mg total) by mouth at bedtime. (Patient not taking: Reported on 04/30/2016) 30 capsule 1  . methocarbamol (ROBAXIN) 500 MG tablet Take  500 mg by mouth 2 (two) times daily.     No facility-administered medications prior to visit.     ROS Review of Systems  Constitutional: Negative for activity change, appetite change and fatigue.  HENT: Negative for congestion, sinus pressure and sore throat.   Eyes: Negative for visual disturbance.  Respiratory: Negative for cough, chest tightness, shortness of breath and wheezing.   Cardiovascular: Negative for chest pain and palpitations.  Gastrointestinal: Negative for abdominal distention, abdominal pain and constipation.  Endocrine: Negative for polydipsia.  Genitourinary: Negative for dysuria and frequency.  Musculoskeletal: Positive for neck pain. Negative for arthralgias and back pain.  Skin: Negative for rash.  Neurological: Positive for numbness. Negative for tremors and light-headedness.  Hematological: Does not bruise/bleed easily.  Psychiatric/Behavioral: Negative for agitation and behavioral problems.    Objective:  BP 140/80 (BP Location: Right Arm, Patient Position: Sitting, Cuff Size: Small)   Pulse 60   Temp 97.5 F (36.4 C) (Oral)   Ht 4\' 11"  (1.499 m)   Wt 141 lb 6.4 oz (64.1 kg)   SpO2 98%   BMI 28.56 kg/m   BP/Weight 09/01/2016 04/30/2016 01/21/2016  Systolic BP 140 158 122  Diastolic BP 80 77 68  Wt. (Lbs) 141.4 148 -  BMI 28.56 29.89 -      Physical Exam  Constitutional: She is oriented to person, place, and time. She appears well-developed and well-nourished.  Cardiovascular: Normal rate, normal heart sounds and intact distal pulses.   No murmur heard. Pulmonary/Chest: Effort normal and breath sounds normal. She has no wheezes. She has no rales. She exhibits no  tenderness.  Abdominal: Soft. Bowel sounds are normal. She exhibits no distension and no mass. There is no tenderness.  Musculoskeletal: Normal range of motion. She exhibits tenderness (Slight tenderness on palpation of trapezius of the right side).  Neurological: She is alert and  oriented to person, place, and time. She has normal reflexes.  Motor strength is normal in all extremities Handgrip is normal bilaterally     Assessment & Plan:   1. Essential hypertension Commenced on lisinopril/HCTZ Low-sodium, DASH diet, lifestyle modification - COMPLETE METABOLIC PANEL WITH GFR  2. Cervical radiculopathy Placed on gabapentin Discussed sedating side effects and patient to take this at night if indicated Neck imaging if symptoms persist  3. Neck muscle spasm Placed on Robaxin Advised to apply heat   Meds ordered this encounter  Medications  . lisinopril-hydrochlorothiazide (PRINZIDE,ZESTORETIC) 10-12.5 MG tablet    Sig: Take 1 tablet by mouth daily.    Dispense:  30 tablet    Refill:  3  . gabapentin (NEURONTIN) 300 MG capsule    Sig: Take 1 capsule (300 mg total) by mouth 2 (two) times daily.    Dispense:  60 capsule    Refill:  1  . methocarbamol (ROBAXIN) 500 MG tablet    Sig: Take 1 tablet (500 mg total) by mouth 2 (two) times daily.    Dispense:  60 tablet    Refill:  1    Follow-up: Return in about 1 month (around 10/02/2016) for follow up of Hypertension and cervical radiculopathy.   Jaclyn ShaggyEnobong Amao MD

## 2016-10-07 ENCOUNTER — Ambulatory Visit: Payer: BLUE CROSS/BLUE SHIELD | Attending: Family Medicine | Admitting: Family Medicine

## 2016-10-07 ENCOUNTER — Encounter: Payer: Self-pay | Admitting: Family Medicine

## 2016-10-07 VITALS — BP 104/68 | HR 63 | Temp 97.3°F | Ht 59.0 in | Wt 143.2 lb

## 2016-10-07 DIAGNOSIS — R200 Anesthesia of skin: Secondary | ICD-10-CM | POA: Insufficient documentation

## 2016-10-07 DIAGNOSIS — M79642 Pain in left hand: Secondary | ICD-10-CM | POA: Insufficient documentation

## 2016-10-07 DIAGNOSIS — M5412 Radiculopathy, cervical region: Secondary | ICD-10-CM | POA: Diagnosis not present

## 2016-10-07 DIAGNOSIS — M549 Dorsalgia, unspecified: Secondary | ICD-10-CM | POA: Diagnosis present

## 2016-10-07 DIAGNOSIS — I10 Essential (primary) hypertension: Secondary | ICD-10-CM

## 2016-10-07 DIAGNOSIS — M5127 Other intervertebral disc displacement, lumbosacral region: Secondary | ICD-10-CM | POA: Insufficient documentation

## 2016-10-07 DIAGNOSIS — M79641 Pain in right hand: Secondary | ICD-10-CM | POA: Diagnosis not present

## 2016-10-07 DIAGNOSIS — Z9889 Other specified postprocedural states: Secondary | ICD-10-CM | POA: Diagnosis not present

## 2016-10-07 MED ORDER — LISINOPRIL-HYDROCHLOROTHIAZIDE 10-12.5 MG PO TABS: 1.0000 | ORAL_TABLET | Freq: Every day | ORAL | 3 refills | Status: DC

## 2016-10-07 NOTE — Progress Notes (Signed)
Needs refills on lisinopril-HCTZ  Send to [facility]

## 2016-10-07 NOTE — Progress Notes (Signed)
Subjective:  Patient ID: Toni Jordan, female    DOB: 1975/08/17  Age: 41 y.o. 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  Pain is worse at night when she sleeps and radiates from her neck down her arms to her hands bilaterally and sometimes makes it difficult to lift things. She has associated tingling. Also complains of right-sided neck pain  Based on gabapentin and Robaxin at her last visit with some improvement in symptoms but she does have some residual neck tightness.  Past Medical History:  Diagnosis Date  . Tuberculosis    treated for 3 months  . Vaginal delivery 2000,2005,1998,1995    Past Surgical History:  Procedure Laterality Date  . LAPAROSCOPIC BILATERAL SALPINGECTOMY Bilateral 09/24/2014   Procedure: LAPAROSCOPIC BILATERAL TUBAL LIGATION;  Surgeon: Mora Bellman, MD;  Location: [facility];  Service: Gynecology;  Laterality: Bilateral;  . LUMBAR LAMINECTOMY/DECOMPRESSION MICRODISCECTOMY Right 11/26/2015   Procedure: RIGHT SIDED LUMBAR 5-SACRUM 1 MICRODISCECTOMY;  Surgeon: Phylliss Bob, MD;  Location: [facility];  Service: Orthopedics;  Laterality: Right;  RIGHT SIDED LUMBAR 5-SACRUM 1 MICRODISCECTOMY    No Known Allergies     Outpatient Medications Prior to Visit  Medication Sig Dispense Refill  . gabapentin (NEURONTIN) 300 MG capsule Take 1 capsule (300 mg total) by mouth 2 (two) times daily. 60 capsule 1  . methocarbamol (ROBAXIN) 500 MG tablet Take 1 tablet (500 mg total) by mouth 2 (two)  times daily. 60 tablet 1  . lisinopril-hydrochlorothiazide (PRINZIDE,ZESTORETIC) 10-12.5 MG tablet Take 1 tablet by mouth daily. 30 tablet 3  . meloxicam (MOBIC) 7.5 MG tablet Take 1 tablet (7.5 mg total) by mouth daily. (Patient not taking: Reported on 09/01/2016) 30 tablet 1  . nortriptyline (PAMELOR) 50 MG capsule Take 1 capsule (50 mg total) by mouth at bedtime. (Patient not taking: Reported on 04/30/2016) 30 capsule 1   No facility-administered medications prior to visit.     ROS Review of Systems Constitutional: Negative for activity change, appetite change and fatigue.  HENT: Negative for congestion, sinus pressure and sore throat.   Eyes: Negative for visual disturbance.  Respiratory: Negative for cough, chest tightness, shortness of breath and wheezing.   Cardiovascular: Negative for chest pain and palpitations.  Gastrointestinal: Negative for abdominal distention, abdominal pain and constipation.  Endocrine: Negative for polydipsia.  Genitourinary: Negative for dysuria and frequency.  Musculoskeletal: Positive for neck pain. Negative for arthralgias and back pain.  Skin: Negative for rash.  Neurological: Positive for numbness. Negative for tremors and light-headedness.  Hematological: Does not bruise/bleed easily.  Psychiatric/Behavioral: Negative for agitation and behavioral problems.   Objective:  BP 104/68 (BP Location: Right Arm, Patient Position: Sitting, Cuff Size: Small)   Pulse 63   Temp 97.3 F (36.3 C) (Oral)   Ht 4' 11"  (1.499 m)   Wt 143 lb 3.2 oz (65 kg)   SpO2 100%   BMI 28.92 kg/m   BP/Weight 10/07/2016 09/01/2016 42/70/6237  Systolic BP 628 315 176  Diastolic BP 68 80 77  Wt. (Lbs) 143.2 141.4 148  BMI  28.92 28.56 29.89      Physical Exam Constitutional: She is oriented to person, place, and time. She appears well-developed and well-nourished.  Cardiovascular: Normal rate, normal heart sounds and intact distal pulses.   No murmur  heard. Pulmonary/Chest: Effort normal and breath sounds normal. She has no wheezes. She has no rales. She exhibits no tenderness.  Abdominal: Soft. Bowel sounds are normal. She exhibits no distension and no mass. There is no tenderness.  Musculoskeletal: Normal range of motion. She exhibits tenderness (Slight tenderness on palpation of trapezius of the right side).  Neurological: She is alert and oriented to person, place, and time. She has normal reflexes.  Motor strength is normal in all extremities Handgrip is normal bilaterally   Assessment & Plan:   1. Cervical radiculopathy Continue gabapentin and Robaxin Apply heat  2. Essential hypertension Controlled Low-sodium diet - CMP14+EGFR; Future - Lipid panel; Future - lisinopril-hydrochlorothiazide (PRINZIDE,ZESTORETIC) 10-12.5 MG tablet; Take 1 tablet by mouth daily.  Dispense: 30 tablet; Refill: 3   Meds ordered this encounter  Medications  . lisinopril-hydrochlorothiazide (PRINZIDE,ZESTORETIC) 10-12.5 MG tablet    Sig: Take 1 tablet by mouth daily.    Dispense:  30 tablet    Refill:  3    Follow-up: Return in about 3 months (around 01/06/2017) for Follow-up on hypertension and cervical radiculopathy.   Arnoldo Morale MD

## 2016-10-07 NOTE — Patient Instructions (Signed)

## 2016-10-08 ENCOUNTER — Ambulatory Visit: Payer: BLUE CROSS/BLUE SHIELD | Attending: Family Medicine

## 2016-10-08 DIAGNOSIS — I10 Essential (primary) hypertension: Secondary | ICD-10-CM | POA: Insufficient documentation

## 2016-10-08 NOTE — Progress Notes (Signed)
Patient here for lab visit only 

## 2016-10-09 LAB — CMP14+EGFR
ALBUMIN: 4.4 g/dL (ref 3.5–5.5)
ALK PHOS: 100 IU/L (ref 39–117)
ALT: 12 IU/L (ref 0–32)
AST: 15 IU/L (ref 0–40)
Albumin/Globulin Ratio: 1.3 (ref 1.2–2.2)
BUN/Creatinine Ratio: 13 (ref 9–23)
BUN: 10 mg/dL (ref 6–24)
Bilirubin Total: 0.4 mg/dL (ref 0.0–1.2)
CALCIUM: 9.2 mg/dL (ref 8.7–10.2)
CO2: 27 mmol/L (ref 18–29)
CREATININE: 0.75 mg/dL (ref 0.57–1.00)
Chloride: 98 mmol/L (ref 96–106)
GFR calc Af Amer: 113 mL/min/{1.73_m2} (ref >59)
GFR, EST NON AFRICAN AMERICAN: 98 mL/min/{1.73_m2} (ref >59)
GLUCOSE: 114 mg/dL — AB (ref 65–99)
Globulin, Total: 3.4 g/dL (ref 1.5–4.5)
Potassium: 4.9 mmol/L (ref 3.5–5.2)
Sodium: 139 mmol/L (ref 134–144)
Total Protein: 7.8 g/dL (ref 6.0–8.5)

## 2016-10-09 LAB — LIPID PANEL
CHOLESTEROL TOTAL: 214 mg/dL — AB (ref 100–199)
Chol/HDL Ratio: 4.7 ratio — ABNORMAL HIGH (ref 0.0–4.4)
HDL: 46 mg/dL (ref >39)
LDL Calculated: 135 mg/dL — ABNORMAL HIGH (ref 0–99)
TRIGLYCERIDES: 167 mg/dL — AB (ref 0–149)
VLDL CHOLESTEROL CAL: 33 mg/dL (ref 5–40)

## 2016-10-20 NOTE — Progress Notes (Signed)
Writer sent lab results and letter to patient after several attempts to reach her by phone.

## 2017-01-14 ENCOUNTER — Ambulatory Visit: Payer: BLUE CROSS/BLUE SHIELD | Attending: Family Medicine | Admitting: Family Medicine

## 2017-01-14 ENCOUNTER — Encounter: Payer: Self-pay | Admitting: Family Medicine

## 2017-01-14 VITALS — BP 105/70 | HR 59 | Temp 98.2°F | Resp 18 | Ht 59.0 in | Wt 138.6 lb

## 2017-01-14 DIAGNOSIS — E7800 Pure hypercholesterolemia, unspecified: Secondary | ICD-10-CM | POA: Insufficient documentation

## 2017-01-14 DIAGNOSIS — Z8611 Personal history of tuberculosis: Secondary | ICD-10-CM | POA: Diagnosis not present

## 2017-01-14 DIAGNOSIS — Z79899 Other long term (current) drug therapy: Secondary | ICD-10-CM | POA: Diagnosis not present

## 2017-01-14 DIAGNOSIS — I10 Essential (primary) hypertension: Secondary | ICD-10-CM | POA: Insufficient documentation

## 2017-01-14 DIAGNOSIS — M5412 Radiculopathy, cervical region: Secondary | ICD-10-CM | POA: Insufficient documentation

## 2017-01-14 DIAGNOSIS — Z9114 Patient's other noncompliance with medication regimen: Secondary | ICD-10-CM | POA: Insufficient documentation

## 2017-01-14 DIAGNOSIS — E785 Hyperlipidemia, unspecified: Secondary | ICD-10-CM | POA: Diagnosis not present

## 2017-01-14 MED ORDER — MELOXICAM 7.5 MG PO TABS: 7.5000 mg | ORAL_TABLET | Freq: Every day | ORAL | 3 refills | Status: AC

## 2017-01-14 MED ORDER — METHOCARBAMOL 500 MG PO TABS: 500.0000 mg | ORAL_TABLET | Freq: Two times a day (BID) | ORAL | 3 refills | Status: DC

## 2017-01-14 MED ORDER — LISINOPRIL-HYDROCHLOROTHIAZIDE 10-12.5 MG PO TABS: 1.0000 | ORAL_TABLET | Freq: Every day | ORAL | 3 refills | Status: DC

## 2017-01-14 MED ORDER — GABAPENTIN 300 MG PO CAPS: 300.0000 mg | ORAL_CAPSULE | Freq: Two times a day (BID) | ORAL | 3 refills | Status: DC

## 2017-01-14 NOTE — Progress Notes (Signed)
Subjective:  Patient ID: Toni Jordan, female    DOB: 11/01/1975  Age: 41 y.o. 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   She complains of chronic right-sided neck pain which radiates down her right upper extremity Pain is worse at night when she sleeps and radiates from her neck down her arms to her hands bilaterally and sometimes makes it difficult to lift things or work She has associated tingling. I placed on gabapentin and Robaxin at last office visit however it seems she had not been taking them. She states she has had some improvement in the paresthesia of her right upper arm.  Low back pain which she previously complained of has resolved.  Compliant with her antihypertensive and remains in tight control for hyperlipidemia.  Past Medical History:  Diagnosis Date  . Tuberculosis    treated for 3 months  . Vaginal delivery 2000,2005,1998,1995    Past Surgical History:  Procedure Laterality Date  . LAPAROSCOPIC BILATERAL SALPINGECTOMY Bilateral 09/24/2014   Procedure: LAPAROSCOPIC BILATERAL TUBAL LIGATION;  Surgeon: Catalina AntiguaPeggy Constant, MD;  Location: [facility];  Service: Gynecology;  Laterality: Bilateral;  . LUMBAR LAMINECTOMY/DECOMPRESSION MICRODISCECTOMY Right 11/26/2015   Procedure: RIGHT SIDED LUMBAR 5-SACRUM 1 MICRODISCECTOMY;  Surgeon: Estill BambergMark Dumonski, MD;  Location: [facility];  Service: Orthopedics;  Laterality: Right;  RIGHT SIDED LUMBAR 5-SACRUM 1 MICRODISCECTOMY    No Known Allergies    Outpatient Medications Prior to Visit  Medication Sig Dispense Refill  . gabapentin (NEURONTIN) 300 MG capsule Take 1 capsule (300 mg total) by mouth 2 (two) times daily. 60 capsule 1  . lisinopril-hydrochlorothiazide (PRINZIDE,ZESTORETIC) 10-12.5  MG tablet Take 1 tablet by mouth daily. 30 tablet 3  . methocarbamol (ROBAXIN) 500 MG tablet Take 1 tablet (500 mg total) by mouth 2 (two) times daily. 60 tablet 1  . nortriptyline (PAMELOR) 50 MG capsule Take 1 capsule (50 mg total) by mouth at bedtime. (Patient not taking: Reported on 04/30/2016) 30 capsule 1  . meloxicam (MOBIC) 7.5 MG tablet Take 1 tablet (7.5 mg total) by mouth daily. (Patient not taking: Reported on 09/01/2016) 30 tablet 1   No facility-administered medications prior to visit.     ROS Review of Systems Constitutional: Negative for activity change, appetite change and fatigue.  HENT: Negative for congestion, sinus pressure and sore throat.   Eyes: Negative for visual disturbance.  Respiratory: Negative for cough, chest tightness, shortness of breath and wheezing.   Cardiovascular: Negative for chest pain and palpitations.  Gastrointestinal: Negative for abdominal distention, abdominal pain and constipation.  Endocrine: Negative for polydipsia.  Genitourinary: Negative for dysuria and frequency.  Musculoskeletal: Positive for neck pain. Negative for arthralgias and back pain.  Skin: Negative for rash.  Neurological: Positive for numbness. Negative for tremors and light-headedness.  Hematological: Does not bruise/bleed easily.  Psychiatric/Behavioral: Negative for agitation and behavioral problems.   Objective:  BP 105/70 (BP Location: Right Arm, Patient Position: Sitting, Cuff Size: Normal)   Pulse (!) 59   Temp 98.2 F (36.8 C) (Oral)   Resp 18   Ht 4\' 11"  (1.499 m)   Wt 138 lb 9.6 oz (62.9 kg)   LMP 01/07/2017 (Approximate)   SpO2 100%   BMI 27.99 kg/m   BP/Weight 01/14/2017 10/07/2016 09/01/2016  Systolic BP 105 104 140  Diastolic BP 70 68 80  Wt. (Lbs) 138.6 143.2 141.4  BMI 27.99 28.92 28.56      Physical Exam Constitutional: She is oriented to person, place, and time. She appears well-developed and well-nourished.  Cardiovascular: Normal rate,  normal heart sounds and intact distal pulses.   No murmur heard. Pulmonary/Chest: Effort normal and breath sounds normal. She has no wheezes. She has no rales. She exhibits no tenderness.  Abdominal: Soft. Bowel sounds are normal. She exhibits no distension and no mass. There is no tenderness.  Musculoskeletal: Normal range of motion. She exhibits tenderness (Slight tenderness on palpation of trapezius of the right side).  Neurological: She is alert and oriented to person, place, and time. She has normal reflexes.  Motor strength is normal in all extremities Handgrip is normal bilaterally   CMP Latest Ref Rng & Units 10/08/2016 11/26/2015  Glucose 65 - 99 mg/dL 161(W) 960(A)  BUN 6 - 24 mg/dL 10 12  Creatinine 5.40 - 1.00 mg/dL 9.81 1.91  Sodium 478 - 144 mmol/L 139 138  Potassium 3.5 - 5.2 mmol/L 4.9 3.9  Chloride 96 - 106 mmol/L 98 105  CO2 18 - 29 mmol/L 27 26  Calcium 8.7 - 10.2 mg/dL 9.2 9.1  Total Protein 6.0 - 8.5 g/dL 7.8 8.0  Total Bilirubin 0.0 - 1.2 mg/dL 0.4 0.5  Alkaline Phos 39 - 117 IU/L 100 120  AST 0 - 40 IU/L 15 24  ALT 0 - 32 IU/L 12 27    Lipid Panel     Component Value Date/Time   CHOL 214 (H) 10/08/2016 0902   TRIG 167 (H) 10/08/2016 0902   HDL 46 10/08/2016 0902   CHOLHDL 4.7 (H) 10/08/2016 0902   LDLCALC 135 (H) 10/08/2016 0902    Assessment & Plan:   1. Essential hypertension Controlled Low-sodium diet - lisinopril-hydrochlorothiazide (PRINZIDE,ZESTORETIC) 10-12.5 MG tablet; Take 1 tablet by mouth daily.  Dispense: 30 tablet; Refill: 3  2. Cervical radiculopathy Uncontrolled due to noncompliance with medications We have discussed goals of pain management which will allow her to be functional. She may not be able to achieve zero pain. - gabapentin (NEURONTIN) 300 MG capsule; Take 1 capsule (300 mg total) by mouth 2 (two) times daily.  Dispense: 60 capsule; Refill: 3 - methocarbamol (ROBAXIN) 500 MG tablet; Take 1 tablet (500 mg total) by mouth 2  (two) times daily.  Dispense: 60 tablet; Refill: 3 - meloxicam (MOBIC) 7.5 MG tablet; Take 1 tablet (7.5 mg total) by mouth daily.  Dispense: 30 tablet; Refill: 3  3. Pure hypercholesterolemia Diet-controlled, lifestyle modifications Lipid panel at next visit   Meds ordered this encounter  Medications  . lisinopril-hydrochlorothiazide (PRINZIDE,ZESTORETIC) 10-12.5 MG tablet    Sig: Take 1 tablet by mouth daily.    Dispense:  30 tablet    Refill:  3  . gabapentin (NEURONTIN) 300 MG capsule    Sig: Take 1 capsule (300 mg total) by mouth 2 (two) times daily.    Dispense:  60 capsule    Refill:  3  . methocarbamol (ROBAXIN) 500 MG tablet    Sig: Take 1 tablet (500 mg total) by mouth 2 (two) times daily.    Dispense:  60 tablet    Refill:  3  . meloxicam (MOBIC) 7.5 MG tablet    Sig: Take 1 tablet (7.5 mg total) by mouth daily.    Dispense:  30 tablet    Refill:  3    Follow-up: Return in about 3 months (  around 04/16/2017) for follow .   This note has been created with Education officer, environmentalDragon speech recognition software and smart phrase technology. Any transcriptional errors are unintentional.     Jaclyn ShaggyEnobong Amao MD

## 2017-01-14 NOTE — Progress Notes (Signed)
Patient has ate  Patient has had her medication

## 2017-01-14 NOTE — Patient Instructions (Signed)
Cervical Sprain A cervical sprain is a stretch or tear in one or more of the tough, cord-like tissues that connect bones (ligaments) in the neck. Cervical sprains can range from mild to severe. Severe cervical sprains can cause the spinal bones (vertebrae) in the neck to be unstable. This can lead to spinal cord damage and can result in serious nervous system problems. The amount of time that it takes for a cervical sprain to get better depends on the cause and extent of the injury. Most cervical sprains heal in 4-6 weeks. What are the causes? Cervical sprains may be caused by an injury (trauma), such as from a motor vehicle accident, a fall, or sudden forward and backward whipping movement of the head and neck (whiplash injury). Mild cervical sprains may be caused by wear and tear over time, such as from poor posture, sitting in a chair that does not provide support, or looking up or down for long periods of time. What increases the risk? The following factors may make you more likely to develop this condition:  Participating in activities that have a high risk of trauma to the neck. These include contact sports, auto racing, gymnastics, and diving.  Taking risks when driving or riding in a motor vehicle, such as speeding.  Having osteoarthritis of the spine.  Having poor strength and flexibility of the neck.  A previous neck injury.  Having poor posture.  Spending a lot of time in certain positions that put stress on the neck, such as sitting at a computer for long periods of time. What are the signs or symptoms? Symptoms of this condition include:  Pain, soreness, stiffness, tenderness, swelling, or a burning sensation in the front, back, or sides of the neck.  Sudden tightening of neck muscles that you cannot control (muscle spasms).  Pain in the shoulders or upper back.  Limited ability to move the neck.  Headache.  Dizziness.  Nausea.  Vomiting.  Weakness, numbness, or  tingling in a hand or an arm. Symptoms may develop right away after injury, or they may develop over a few days. In some cases, symptoms may go away with treatment and return (recur) over time. How is this diagnosed? This condition may be diagnosed based on:  Your medical history.  Your symptoms.  Any recent injuries or known neck problems that you have, such as arthritis in the neck.  A physical exam.  Imaging tests, such as:  X-rays.  MRI.  CT scan. How is this treated? This condition is treated by resting and icing the injured area and doing physical therapy exercises. Depending on the severity of your condition, treatment may also include:  Keeping your neck in place (immobilized) for periods of time. This may be done using:  A cervical collar. This supports your chin and the back of your head.  A cervical traction device. This is a sling that holds up your head. This removes weight and pressure from your neck, and it may help to relieve pain.  Medicines that help to relieve pain and inflammation.  Medicines that help to relax your muscles (muscle relaxants).  Surgery. This is rare. Follow these instructions at home: If you have a cervical collar:   Wear it as told by your health care provider. Do not remove the collar unless instructed by your health care provider.  Ask your health care provider before you make any adjustments to your collar.  If you have long hair, keep it outside of the collar.    Ask your health care provider if you can remove the collar for cleaning and bathing. If you are allowed to remove the collar for cleaning or bathing:  Follow instructions from your health care provider about how to remove the collar safely.  Clean the collar by wiping it with mild soap and water and drying it completely.  If your collar has removable pads, remove them every 1-2 days and wash them by hand with soap and water. Let them air-dry completely before you put  them back in the collar.  Check your skin under the collar for irritation or sores. If you see any, tell your health care provider. Managing pain, stiffness, and swelling   If directed, use a cervical traction device as told by your health care provider.  If directed, apply heat to the affected area before you do your physical therapy or as often as told by your health care provider. Use the heat source that your health care provider recommends, such as a moist heat pack or a heating pad.  Place a towel between your skin and the heat source.  Leave the heat on for 20-30 minutes.  Remove the heat if your skin turns bright red. This is especially important if you are unable to feel pain, heat, or cold. You may have a greater risk of getting burned.  If directed, put ice on the affected area:  Put ice in a plastic bag.  Place a towel between your skin and the bag.  Leave the ice on for 20 minutes, 2-3 times a day. Activity   Do not drive while wearing a cervical collar. If you do not have a cervical collar, ask your health care provider if it is safe to drive while your neck heals.  Do not drive or use heavy machinery while taking prescription pain medicine or muscle relaxants, unless your health care provider approves.  Do not lift anything that is heavier than 10 lb (4.5 kg) until your health care provider tells you that it is safe.  Rest as directed by your health care provider. Avoid positions and activities that make your symptoms worse. Ask your health care provider what activities are safe for you.  If physical therapy was prescribed, do exercises as told by your health care provider or physical therapist. General instructions   Take over-the-counter and prescription medicines only as told by your health care provider.  Do not use any products that contain nicotine or tobacco, such as cigarettes and e-cigarettes. These can delay healing. If you need help quitting, ask your  health care provider.  Keep all follow-up visits as told by your health care provider or physical therapist. This is important. How is this prevented? To prevent a cervical sprain from happening again:  Use and maintain good posture. Make any needed adjustments to your workstation to help you use good posture.  Exercise regularly as directed by your health care provider or physical therapist.  Avoid risky activities that may cause a cervical sprain. Contact a health care provider if:  You have symptoms that get worse or do not get better after 2 weeks of treatment.  You have pain that gets worse or does not get better with medicine.  You develop new, unexplained symptoms.  You have sores or irritated skin on your neck from wearing your cervical collar. Get help right away if:  You have severe pain.  You develop numbness, tingling, or weakness in any part of your body.  You cannot move   a part of your body (you have paralysis).  You have neck pain along with:  Severe dizziness.  Headache. Summary  A cervical sprain is a stretch or tear in one or more of the tough, cord-like tissues that connect bones (ligaments) in the neck.  Cervical sprains may be caused by an injury (trauma), such as from a motor vehicle accident, a fall, or sudden forward and backward whipping movement of the head and neck (whiplash injury).  Symptoms may develop right away after injury, or they may develop over a few days.  This condition is treated by resting and icing the injured area and doing physical therapy exercises. This information is not intended to replace advice given to you by your health care provider. Make sure you discuss any questions you have with your health care provider. Document Released: 04/04/2007 Document Revised: 02/04/2016 Document Reviewed: 02/04/2016 Elsevier Interactive Patient Education  2017 [employer]  

## 2017-04-11 ENCOUNTER — Encounter: Payer: Self-pay | Admitting: Family Medicine

## 2017-04-11 ENCOUNTER — Ambulatory Visit: Payer: Self-pay | Attending: Family Medicine | Admitting: Family Medicine

## 2017-04-11 VITALS — BP 143/87 | HR 61 | Temp 97.7°F | Ht 59.0 in | Wt 142.4 lb

## 2017-04-11 DIAGNOSIS — M5127 Other intervertebral disc displacement, lumbosacral region: Secondary | ICD-10-CM | POA: Insufficient documentation

## 2017-04-11 DIAGNOSIS — M5412 Radiculopathy, cervical region: Secondary | ICD-10-CM | POA: Insufficient documentation

## 2017-04-11 DIAGNOSIS — Z23 Encounter for immunization: Secondary | ICD-10-CM

## 2017-04-11 DIAGNOSIS — E7800 Pure hypercholesterolemia, unspecified: Secondary | ICD-10-CM | POA: Insufficient documentation

## 2017-04-11 DIAGNOSIS — I10 Essential (primary) hypertension: Secondary | ICD-10-CM | POA: Insufficient documentation

## 2017-04-11 DIAGNOSIS — Z79899 Other long term (current) drug therapy: Secondary | ICD-10-CM | POA: Insufficient documentation

## 2017-04-11 MED ORDER — METHOCARBAMOL 500 MG PO TABS: 500.0000 mg | ORAL_TABLET | Freq: Two times a day (BID) | ORAL | 3 refills | Status: DC

## 2017-04-11 MED ORDER — GABAPENTIN 300 MG PO CAPS: 300.0000 mg | ORAL_CAPSULE | Freq: Two times a day (BID) | ORAL | 3 refills | Status: DC

## 2017-04-11 MED ORDER — LISINOPRIL-HYDROCHLOROTHIAZIDE 10-12.5 MG PO TABS: 1.0000 | ORAL_TABLET | Freq: Every day | ORAL | 3 refills | Status: DC

## 2017-04-11 MED FILL — LISINOPRIL-HCTZ 10-12.5 MG: 10-12.5 | 30 days supply | Qty: 30 | Fill #0

## 2017-04-11 MED FILL — GABAPENTIN 300 MG CAPSULE: 300 | 30 days supply | Qty: 60 | Fill #0

## 2017-04-11 MED FILL — METHOCARBAMOL 500 MG TABS: 500 | 30 days supply | Qty: 60 | Fill #0

## 2017-04-11 NOTE — Patient Instructions (Signed)
Cervical Radiculopathy Cervical radiculopathy happens when a nerve in the neck (cervical nerve) is pinched or bruised. This condition can develop because of an injury or as part of the normal aging process. Pressure on the cervical nerves can cause pain or numbness that runs from the neck all the way down into the arm and fingers. Usually, this condition gets better with rest. Treatment may be needed if the condition does not improve. What are the causes? This condition may be caused by:  Injury.  Slipped (herniated) disk.  Muscle tightness in the neck because of overuse.  Arthritis.  Breakdown or degeneration in the bones and joints of the spine (spondylosis) due to aging.  Bone spurs that may develop near the cervical nerves.  What are the signs or symptoms? Symptoms of this condition include:  Pain that runs from the neck to the arm and hand. The pain can be severe or irritating. It may be worse when the neck is moved.  Numbness or weakness in the affected arm and hand.  How is this diagnosed? This condition may be diagnosed based on symptoms, medical history, and a physical exam. You may also have tests, including:  X-rays.  CT scan.  MRI.  Electromyogram (EMG).  Nerve conduction tests.  How is this treated? In many cases, treatment is not needed for this condition. With rest, the condition usually gets better over time. If treatment is needed, options may include:  Wearing a soft neck collar for short periods of time.  Physical therapy to strengthen your neck muscles.  Medicines, such as NSAIDs, oral corticosteroids, or spinal injections.  Surgery. This may be needed if other treatments do not help. Various types of surgery may be done depending on the cause of your problems.  Follow these instructions at home: Managing pain  Take over-the-counter and prescription medicines only as told by your health care provider.  If directed, apply ice to the affected  area. ? Put ice in a plastic bag. ? Place a towel between your skin and the bag. ? Leave the ice on for 20 minutes, 2-3 times per day.  If ice does not help, you can try using heat. Take a warm shower or warm bath, or use a heat pack as told by your health care provider.  Try a gentle neck and shoulder massage to help relieve symptoms. Activity  Rest as needed. Follow instructions from your health care provider about any restrictions on activities.  Do stretching and strengthening exercises as told by your health care provider or physical therapist. General instructions  If you were given a soft collar, wear it as told by your health care provider.  Use a flat pillow when you sleep.  Keep all follow-up visits as told by your health care provider. This is important. Contact a health care provider if:  Your condition does not improve with treatment. Get help right away if:  Your pain gets much worse and cannot be controlled with medicines.  You have weakness or numbness in your hand, arm, face, or leg.  You have a high fever.  You have a stiff, rigid neck.  You lose control of your bowels or your bladder (have incontinence).  You have trouble with walking, balance, or speaking. This information is not intended to replace advice given to you by your health care provider. Make sure you discuss any questions you have with your health care provider. Document Released: 03/02/2001 Document Revised: 11/13/2015 Document Reviewed: 08/01/2014 Elsevier Interactive Patient Education    2018 [employer]  

## 2017-04-11 NOTE — Progress Notes (Signed)
Subjective:  Patient ID: Toni Jordan, female    DOB: 12-26-1975  Age: 41 y.o. 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  She has been compliant with her antihypertensive and denies any side effects from her medication. For her hyperlipidemia she has been on dietary control and lifestyle modifications and has been working on a low-cholesterol diet and exercise.  She continues to experience neck pain which radiates to the right upper extremity worsened with activity and performing house chores but relieved at rest. There is associated numbness in her right hand but she denies dropping things. This led to her quitting her job due to her symptoms and she reports improvement ever since she quit.  She has lost her insurance as a result of her job loss and is in the process of applying for Medicaid.  Past Medical History:  Diagnosis Date  . Tuberculosis    treated for 3 months  . Vaginal delivery 2000,2005,1998,1995    Past Surgical History:  Procedure Laterality Date  . LAPAROSCOPIC BILATERAL SALPINGECTOMY Bilateral 09/24/2014   Procedure: LAPAROSCOPIC BILATERAL TUBAL LIGATION;  Surgeon: Mora Bellman, MD;  Location: [facility];  Service: Gynecology;  Laterality: Bilateral;  . LUMBAR LAMINECTOMY/DECOMPRESSION MICRODISCECTOMY Right 11/26/2015   Procedure: RIGHT SIDED LUMBAR 5-SACRUM 1 MICRODISCECTOMY;  Surgeon: Phylliss Bob, MD;  Location: [facility];  Service: Orthopedics;  Laterality: Right;  RIGHT SIDED LUMBAR 5-SACRUM 1 MICRODISCECTOMY    No Known Allergies   Outpatient Medications Prior to Visit  Medication Sig Dispense Refill  . meloxicam (MOBIC) 7.5 MG tablet Take 1 tablet (7.5 mg total) by mouth daily. 30 tablet 3  . gabapentin  (NEURONTIN) 300 MG capsule Take 1 capsule (300 mg total) by mouth 2 (two) times daily. 60 capsule 3  . lisinopril-hydrochlorothiazide (PRINZIDE,ZESTORETIC) 10-12.5 MG tablet Take 1 tablet by mouth daily. 30 tablet 3  . methocarbamol (ROBAXIN) 500 MG tablet Take 1 tablet (500 mg total) by mouth 2 (two) times daily. 60 tablet 3  . nortriptyline (PAMELOR) 50 MG capsule Take 1 capsule (50 mg total) by mouth at bedtime. (Patient not taking: Reported on 04/30/2016) 30 capsule 1   No facility-administered medications prior to visit.     ROS Review of Systems  Constitutional: Negative for activity change, appetite change and fatigue.  HENT: Negative for congestion, sinus pressure and sore throat.   Eyes: Negative for visual disturbance.  Respiratory: Negative for cough, chest tightness, shortness of breath and wheezing.   Cardiovascular: Negative for chest pain and palpitations.  Gastrointestinal: Negative for abdominal distention, abdominal pain and constipation.  Endocrine: Negative for polydipsia.  Genitourinary: Negative for dysuria and frequency.  Musculoskeletal: Positive for neck pain. Negative for arthralgias and back pain.  Skin: Negative for rash.  Neurological: Positive for numbness. Negative for tremors and light-headedness.  Hematological: Does not bruise/bleed easily.  Psychiatric/Behavioral: Negative for agitation and behavioral problems.    Objective:  BP (!) 143/87   Pulse 61   Temp 97.7 F (36.5 C) (Oral)   Ht 4' 11"  (1.499 m)   Wt 142 lb 6.4 oz (64.6 kg)   SpO2 99%   BMI 28.76 kg/m   BP/Weight 04/11/2017 01/14/2017 09/27/8117  Systolic BP 147 829 562  Diastolic BP 87 70 68  Wt. (Lbs) 142.4 138.6 143.2  BMI 28.76  27.99 28.92      Physical Exam  Constitutional: She is oriented to person, place, and time. She appears well-developed and well-nourished.  Cardiovascular: Normal rate, normal heart sounds and intact distal pulses.   No murmur heard. Pulmonary/Chest:  Effort normal and breath sounds normal. She has no wheezes. She has no rales. She exhibits no tenderness.  Abdominal: Soft. Bowel sounds are normal. She exhibits no distension and no mass. There is no tenderness.  Musculoskeletal: Normal range of motion. She exhibits tenderness ( slight tenderness on palpation of right trapezius).  Neurological: She is alert and oriented to person, place, and time. She has normal reflexes. No cranial nerve deficit. She exhibits normal muscle tone. Coordination normal.   motor strength: Normal  hand grip normal in both upper extremities   Skin: Skin is dry.     CMP Latest Ref Rng & Units 10/08/2016 11/26/2015  Glucose 65 - 99 mg/dL 114(H) 117(H)  BUN 6 - 24 mg/dL 10 12  Creatinine 0.57 - 1.00 mg/dL 0.75 0.69  Sodium 134 - 144 mmol/L 139 138  Potassium 3.5 - 5.2 mmol/L 4.9 3.9  Chloride 96 - 106 mmol/L 98 105  CO2 18 - 29 mmol/L 27 26  Calcium 8.7 - 10.2 mg/dL 9.2 9.1  Total Protein 6.0 - 8.5 g/dL 7.8 8.0  Total Bilirubin 0.0 - 1.2 mg/dL 0.4 0.5  Alkaline Phos 39 - 117 IU/L 100 120  AST 0 - 40 IU/L 15 24  ALT 0 - 32 IU/L 12 27    Lipid Panel     Component Value Date/Time   CHOL 214 (H) 10/08/2016 0902   TRIG 167 (H) 10/08/2016 0902   HDL 46 10/08/2016 0902   CHOLHDL 4.7 (H) 10/08/2016 0902   LDLCALC 135 (H) 10/08/2016 0902    The 10-year ASCVD risk score Mikey Bussing DC Jr., et al., 2013) is: 1.8%   Values used to calculate the score:     Age: 46 years     Sex: Female     Is Non-Hispanic African American: No     Diabetic: No     Tobacco smoker: No     Systolic Blood Pressure: 101 mmHg     Is BP treated: Yes     HDL Cholesterol: 46 mg/dL     Total Cholesterol: 214 mg/dL  Assessment & Plan:   1. Essential hypertension Controlled Low-sodium, DASH diet - lisinopril-hydrochlorothiazide (PRINZIDE,ZESTORETIC) 10-12.5 MG tablet; Take 1 tablet by mouth daily.  Dispense: 30 tablet; Refill: 3 - CMP14+EGFR  2. Cervical  radiculopathy Uncontrolled We'll order a C-spine x-ray and if unrevealing proceed with MRI of the C-spine Consider addition of Cymbalta at next visit if symptoms persist - gabapentin (NEURONTIN) 300 MG capsule; Take 1 capsule (300 mg total) by mouth 2 (two) times daily.  Dispense: 60 capsule; Refill: 3 - methocarbamol (ROBAXIN) 500 MG tablet; Take 1 tablet (500 mg total) by mouth 2 (two) times daily.  Dispense: 60 tablet; Refill: 3 - DG Cervical Spine Complete; Future  3. Pure hypercholesterolemia Currently on diet control Low cholesterol diet 10 year ASCVD risk is 1.8% - Lipid panel   Meds ordered this encounter  Medications  . lisinopril-hydrochlorothiazide (PRINZIDE,ZESTORETIC) 10-12.5 MG tablet    Sig: Take 1 tablet by mouth daily.    Dispense:  30 tablet    Refill:  3  . gabapentin (NEURONTIN) 300 MG capsule    Sig: Take 1 capsule (300 mg total) by mouth 2 (two) times daily.    Dispense:  60 capsule    Refill:  3  . methocarbamol (ROBAXIN) 500 MG tablet    Sig: Take 1 tablet (500 mg total) by mouth 2 (two) times daily.    Dispense:  60 tablet    Refill:  3    Follow-up: Return in about 3 months (around 07/12/2017) for follow up of Hypertension and cervical radiculopathy.   Arnoldo Morale MD

## 2017-04-12 LAB — LIPID PANEL
CHOL/HDL RATIO: 4.5 ratio — AB (ref 0.0–4.4)
Cholesterol, Total: 209 mg/dL — ABNORMAL HIGH (ref 100–199)
HDL: 46 mg/dL (ref >39)
LDL CALC: 129 mg/dL — AB (ref 0–99)
Triglycerides: 170 mg/dL — ABNORMAL HIGH (ref 0–149)
VLDL CHOLESTEROL CAL: 34 mg/dL (ref 5–40)

## 2017-04-12 LAB — CMP14+EGFR
ALK PHOS: 89 IU/L (ref 39–117)
ALT: 10 IU/L (ref 0–32)
AST: 12 IU/L (ref 0–40)
Albumin/Globulin Ratio: 1.3 (ref 1.2–2.2)
Albumin: 4.2 g/dL (ref 3.5–5.5)
BUN/Creatinine Ratio: 17 (ref 9–23)
BUN: 12 mg/dL (ref 6–24)
Bilirubin Total: 0.3 mg/dL (ref 0.0–1.2)
CO2: 25 mmol/L (ref 20–29)
CREATININE: 0.7 mg/dL (ref 0.57–1.00)
Calcium: 9.2 mg/dL (ref 8.7–10.2)
Chloride: 102 mmol/L (ref 96–106)
GFR calc Af Amer: 123 mL/min/{1.73_m2} (ref >59)
GFR calc non Af Amer: 106 mL/min/{1.73_m2} (ref >59)
GLUCOSE: 118 mg/dL — AB (ref 65–99)
Globulin, Total: 3.2 g/dL (ref 1.5–4.5)
Potassium: 4.5 mmol/L (ref 3.5–5.2)
SODIUM: 140 mmol/L (ref 134–144)
Total Protein: 7.4 g/dL (ref 6.0–8.5)

## 2017-04-20 ENCOUNTER — Ambulatory Visit
Admission: RE | Admit: 2017-04-20 | Discharge: 2017-04-20 | Disposition: A | Discharge status: Home / Self Care | Payer: Self-pay | Source: Ambulatory Visit | Attending: Family Medicine | Admitting: Family Medicine

## 2017-04-20 ENCOUNTER — Ambulatory Visit: Payer: Self-pay | Attending: Internal Medicine

## 2017-04-20 DIAGNOSIS — M5412 Radiculopathy, cervical region: Secondary | ICD-10-CM

## 2017-04-22 ENCOUNTER — Telehealth: Payer: Self-pay

## 2017-04-22 NOTE — Telephone Encounter (Signed)
Pt was called and informed of lab results. 

## 2017-05-06 ENCOUNTER — Ambulatory Visit: Payer: BLUE CROSS/BLUE SHIELD | Admitting: Family Medicine

## 2017-07-12 ENCOUNTER — Ambulatory Visit: Payer: Self-pay | Admitting: Family Medicine

## 2017-08-24 ENCOUNTER — Encounter: Payer: Self-pay | Admitting: Family Medicine

## 2017-08-24 ENCOUNTER — Ambulatory Visit: Payer: Self-pay | Attending: Family Medicine | Admitting: Family Medicine

## 2017-08-24 VITALS — BP 141/74 | HR 58 | Temp 97.3°F | Ht 59.0 in | Wt 139.4 lb

## 2017-08-24 DIAGNOSIS — E785 Hyperlipidemia, unspecified: Secondary | ICD-10-CM | POA: Insufficient documentation

## 2017-08-24 DIAGNOSIS — M5412 Radiculopathy, cervical region: Secondary | ICD-10-CM

## 2017-08-24 DIAGNOSIS — I10 Essential (primary) hypertension: Secondary | ICD-10-CM

## 2017-08-24 DIAGNOSIS — Z79899 Other long term (current) drug therapy: Secondary | ICD-10-CM | POA: Insufficient documentation

## 2017-08-24 MED ORDER — PREDNISONE 20 MG PO TABS: 20.0000 mg | ORAL_TABLET | Freq: Every day | ORAL | 0 refills | Status: DC

## 2017-08-24 MED ORDER — GABAPENTIN 300 MG PO CAPS: 600.0000 mg | ORAL_CAPSULE | Freq: Two times a day (BID) | ORAL | 3 refills | Status: DC

## 2017-08-24 MED ORDER — METHOCARBAMOL 750 MG PO TABS: 750.0000 mg | ORAL_TABLET | Freq: Two times a day (BID) | ORAL | 3 refills | Status: DC

## 2017-08-24 MED ORDER — LISINOPRIL-HYDROCHLOROTHIAZIDE 10-12.5 MG PO TABS: 1.0000 | ORAL_TABLET | Freq: Every day | ORAL | 3 refills | Status: DC

## 2017-08-24 NOTE — Progress Notes (Signed)
Subjective:  Patient ID: Toni Jordan, female    DOB: 20-Jun-1976  Age: 42 y.o. 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  She reports continued right-sided neck pain with radiation to hands and numbness/tingling of bilateral hands, symptoms are worse at night. Occasionally has some tingling of feet but this has improved greatly post-surgery. She does c/o of hand swelling at night. She has not taken Robaxin or Gabapentin because it has not relieved paresthesia. She deneis dropping objects but does c/o neck pain when lifting heavy objects. Cervical spine X-ray obtained 04/21/2017 revealed no evidence of fracture or tissue swelling.   Reports adherence with medications but has not had antihypertensives in the past week, she is in need of refills. Tries to follow a low sodium diet. She was recently approved for medicaid.   Past Medical History:  Diagnosis Date  . Tuberculosis    treated for 3 months  . Vaginal delivery 2000,2005,1998,1995   Past Surgical History:  Procedure Laterality Date  . LAPAROSCOPIC BILATERAL SALPINGECTOMY Bilateral 09/24/2014   Procedure: LAPAROSCOPIC BILATERAL TUBAL LIGATION;  Surgeon: Mora Bellman, MD;  Location: [facility];  Service: Gynecology;  Laterality: Bilateral;  . LUMBAR LAMINECTOMY/DECOMPRESSION MICRODISCECTOMY Right 11/26/2015   Procedure: RIGHT SIDED LUMBAR 5-SACRUM 1 MICRODISCECTOMY;  Surgeon: Phylliss Bob, MD;  Location: [facility];  Service: Orthopedics;  Laterality: Right;  RIGHT SIDED LUMBAR 5-SACRUM 1 MICRODISCECTOMY   No Known Allergies  Outpatient Medications Prior to Visit  Medication Sig Dispense Refill  . lisinopril-hydrochlorothiazide (PRINZIDE,ZESTORETIC) 10-12.5 MG tablet Take 1 tablet by  mouth daily. 30 tablet 3  . meloxicam (MOBIC) 7.5 MG tablet Take 1 tablet (7.5 mg total) by mouth daily. (Patient not taking: Reported on 08/24/2017) 30 tablet 3  . gabapentin (NEURONTIN) 300 MG capsule Take 1 capsule (300 mg total) by mouth 2 (two) times daily. (Patient not taking: Reported on 08/24/2017) 60 capsule 3  . methocarbamol (ROBAXIN) 500 MG tablet Take 1 tablet (500 mg total) by mouth 2 (two) times daily. (Patient not taking: Reported on 08/24/2017) 60 tablet 3   No facility-administered medications prior to visit.     ROS Review of Systems  Constitutional: Negative.   Respiratory: Negative.  Negative for cough and shortness of breath.   Cardiovascular: Negative.  Negative for chest pain and palpitations.  Gastrointestinal: Negative.   Genitourinary: Negative.   Musculoskeletal: Positive for neck pain.  Neurological: Positive for numbness (of hands). Negative for dizziness and weakness.  Psychiatric/Behavioral: Negative.     Objective:  BP (!) 141/74   Pulse (!) 58   Temp (!) 97.3 F (36.3 C) (Oral)   Ht 4' 11"  (1.499 m)   Wt 139 lb 6.4 oz (63.2 kg)   SpO2 100%   BMI 28.16 kg/m   BP/Weight 08/24/2017 04/11/2017 5/62/1308  Systolic BP 657 846 962  Diastolic BP 74 87 70  Wt. (Lbs) 139.4 142.4 138.6  BMI 28.16 28.76 27.99   Results for orders placed or performed in visit on 04/11/17  Lipid panel  Result Value Ref Range   Cholesterol, Total 209 (H) 100 - 199 mg/dL   Triglycerides 170 (H) 0 - 149 mg/dL   HDL 46 >39 mg/dL   VLDL Cholesterol Cal 34 5 - 40 mg/dL   LDL Calculated 129 (H)  0 - 99 mg/dL   Chol/HDL Ratio 4.5 (H) 0.0 - 4.4 ratio  CMP14+EGFR  Result Value Ref Range   Glucose 118 (H) 65 - 99 mg/dL   BUN 12 6 - 24 mg/dL   Creatinine, Ser 0.70 0.57 - 1.00 mg/dL   GFR calc non Af Amer 106 >59 mL/min/1.73   GFR calc Af Amer 123 >59 mL/min/1.73   BUN/Creatinine Ratio 17 9 - 23   Sodium 140 134 - 144 mmol/L   Potassium 4.5 3.5 - 5.2 mmol/L   Chloride 102 96 -  106 mmol/L   CO2 25 20 - 29 mmol/L   Calcium 9.2 8.7 - 10.2 mg/dL   Total Protein 7.4 6.0 - 8.5 g/dL   Albumin 4.2 3.5 - 5.5 g/dL   Globulin, Total 3.2 1.5 - 4.5 g/dL   Albumin/Globulin Ratio 1.3 1.2 - 2.2   Bilirubin Total 0.3 0.0 - 1.2 mg/dL   Alkaline Phosphatase 89 39 - 117 IU/L   AST 12 0 - 40 IU/L   ALT 10 0 - 32 IU/L    Physical Exam  Constitutional: She is oriented to person, place, and time. She appears well-developed and well-nourished.  HENT:  Head: Normocephalic and atraumatic.  Neck: Normal range of motion. Neck supple.  Cardiovascular: Normal rate, regular rhythm, normal heart sounds and intact distal pulses.  Pulmonary/Chest: Effort normal and breath sounds normal.  Abdominal: Soft. Bowel sounds are normal.  Musculoskeletal: Normal range of motion. She exhibits tenderness (of right neck and shoulder.). She exhibits no edema. Deformity: no edema of hands and feet noted.   Lymphadenopathy:    She has no cervical adenopathy.  Neurological: She is alert and oriented to person, place, and time. She has normal strength. No sensory deficit.  Skin: Skin is warm, dry and intact.  Psychiatric: She has a normal mood and affect. Her speech is normal.     Assessment & Plan:   1. Cervical radiculopathy Uncontrolled Increase Robaxin from 538m to 7546mBID and increased Gabapentin from 30037mo 600m69mD. Prednisone to help improve numbness and swelling of hands. Vitamin B12 to rule out other cause for paresthesia.  - MR Cervical Spine Wo Contrast; Future - Vitamin B12 - gabapentin (NEURONTIN) 300 MG capsule; Take 2 capsules (600 mg total) by mouth 2 (two) times daily.  Dispense: 120 capsule; Refill: 3 - methocarbamol (ROBAXIN) 750 MG tablet; Take 1 tablet (750 mg total) by mouth 2 (two) times daily.  Dispense: 60 tablet; Refill: 3 - predniSONE (DELTASONE) 20 MG tablet; Take 1 tablet (20 mg total) by mouth daily with breakfast.  Dispense: 5 tablet; Refill: 0  2. Essential  hypertension Contolled We have discussed target BP range and blood pressure goal. I have advised patient to check BP regularly and to call us [facility] or report to clinic if the numbers are consistently higher than 140/90. We discussed the importance of compliance with medical therapy and DASH diet recommended, consequences of uncontrolled hypertension discussed.  - continue current BP medications - lisinopril-hydrochlorothiazide (PRINZIDE,ZESTORETIC) 10-12.5 MG tablet; Take 1 tablet by mouth daily.  Dispense: 30 tablet; Refill: 3   Meds ordered this encounter  Medications  . lisinopril-hydrochlorothiazide (PRINZIDE,ZESTORETIC) 10-12.5 MG tablet    Sig: Take 1 tablet by mouth daily.    Dispense:  30 tablet    Refill:  3  . gabapentin (NEURONTIN) 300 MG capsule    Sig: Take 2 capsules (600 mg total) by mouth 2 (two) times daily.    Dispense:  120 capsule    Refill:  3  . methocarbamol (ROBAXIN) 750 MG tablet    Sig: Take 1 tablet (750 mg total) by mouth 2 (two) times daily.    Dispense:  60 tablet    Refill:  3  . predniSONE (DELTASONE) 20 MG tablet    Sig: Take 1 tablet (20 mg total) by mouth daily with breakfast.    Dispense:  5 tablet    Refill:  0    Follow-up: Return in about 3 months (around 11/24/2017) for follow up of chronic medical conditions.   Jonnie Kind DNP student

## 2017-08-24 NOTE — Patient Instructions (Signed)
Cervical Radiculopathy  Cervical radiculopathy means that a nerve in the neck is pinched or bruised. This can cause pain or loss of feeling (numbness) that runs from your neck to your arm and fingers.  Follow these instructions at home:  Managing pain  ? Take over-the-counter and prescription medicines only as told by your doctor.  ? If directed, put ice on the injured or painful area.  ? Put ice in a plastic bag.  ? Place a towel between your skin and the bag.  ? Leave the ice on for 20 minutes, 2?3 times per day.  ? If ice does not help, you can try using heat. Take a warm shower or warm bath, or use a heat pack as told by your doctor.  ? You may try a gentle neck and shoulder massage.  Activity  ? Rest as needed. Follow instructions from your doctor about any activities to avoid.  ? Do exercises as told by your doctor or physical therapist.  General instructions  ? If you were given a soft collar, wear it as told by your doctor.  ? Use a flat pillow when you sleep.  ? Keep all follow-up visits as told by your doctor. This is important.  Contact a doctor if:  ? Your condition does not improve with treatment.  Get help right away if:  ? Your pain gets worse and is not controlled with medicine.  ? You lose feeling or feel weak in your hand, arm, face, or leg.  ? You have a fever.  ? You have a stiff neck.  ? You cannot control when you poop or pee (have incontinence).  ? You have trouble with walking, balance, or talking.  This information is not intended to replace advice given to you by your health care provider. Make sure you discuss any questions you have with your health care provider.  Document Released: 05/27/2011 Document Revised: 11/13/2015 Document Reviewed: 08/01/2014  Elsevier Interactive Patient Education ? 2018 [employer]

## 2017-08-25 ENCOUNTER — Other Ambulatory Visit: Payer: Self-pay

## 2017-08-25 ENCOUNTER — Telehealth: Payer: Self-pay | Admitting: Family Medicine

## 2017-08-25 DIAGNOSIS — M5412 Radiculopathy, cervical region: Secondary | ICD-10-CM

## 2017-08-25 DIAGNOSIS — I10 Essential (primary) hypertension: Secondary | ICD-10-CM

## 2017-08-25 LAB — VITAMIN B12: VITAMIN B 12: 440 pg/mL (ref 232–1245)

## 2017-08-25 MED ORDER — PREDNISONE 20 MG PO TABS: 20.0000 mg | ORAL_TABLET | Freq: Every day | ORAL | 0 refills | Status: AC

## 2017-08-25 MED ORDER — GABAPENTIN 300 MG PO CAPS
600.0000 mg | ORAL_CAPSULE | Freq: Two times a day (BID) | ORAL | 3 refills | Status: AC
Start: 2017-08-25 — End: ?

## 2017-08-25 MED ORDER — LISINOPRIL-HYDROCHLOROTHIAZIDE 10-12.5 MG PO TABS: 1.0000 | ORAL_TABLET | Freq: Every day | ORAL | 3 refills | Status: DC

## 2017-08-25 MED ORDER — METHOCARBAMOL 750 MG PO TABS: 750.0000 mg | ORAL_TABLET | Freq: Two times a day (BID) | ORAL | 3 refills | Status: AC

## 2017-08-25 NOTE — Telephone Encounter (Signed)
Medications have been sent over to pharmacy.

## 2017-08-25 NOTE — Telephone Encounter (Signed)
Patient called and stated the pharmacies system was down yesterday and if pcp could resubmit request for medications refill from yesterdays visit.  [facility] [phone] - [place], [place] - [address] [facility] [address] [address]

## 2017-09-01 ENCOUNTER — Telehealth: Payer: Self-pay

## 2017-09-01 ENCOUNTER — Ambulatory Visit (HOSPITAL_COMMUNITY): Payer: No Typology Code available for payment source

## 2017-09-01 NOTE — Telephone Encounter (Signed)
Patient was called via interpretor and informed of lab results. [phone])

## 2017-12-30 ENCOUNTER — Other Ambulatory Visit: Payer: Self-pay | Admitting: *Deleted

## 2017-12-30 ENCOUNTER — Telehealth: Payer: Self-pay | Admitting: Family Medicine

## 2017-12-30 DIAGNOSIS — I10 Essential (primary) hypertension: Secondary | ICD-10-CM

## 2017-12-30 MED ORDER — LISINOPRIL-HYDROCHLOROTHIAZIDE 10-12.5 MG PO TABS
1.0000 | ORAL_TABLET | Freq: Every day | ORAL | 3 refills | Status: AC
Start: 2017-12-30 — End: ?

## 2017-12-30 NOTE — Telephone Encounter (Signed)
Pt called to request a refill on -lisinopril-hydrochlorothiazide (PRINZIDE,ZESTORETIC) 10-12.5 MG tablet  To -[facility] [phone] - , [place] - [address] [facility] [address]

## 2018-04-06 ENCOUNTER — Encounter: Attending: Family Medicine

## 2018-04-11 ENCOUNTER — Ambulatory Visit: Admit: 2018-04-11 | Discharge: 2018-04-11 | Payer: MEDICAID | Attending: Family Medicine

## 2018-04-11 DIAGNOSIS — M25512 Pain in left shoulder: Secondary | ICD-10-CM

## 2018-04-11 MED ORDER — METHYLPREDNISOLONE 4 MG PO TBPK
4 MG | PACK | ORAL | 0 refills | Status: DC
Start: 2018-04-11 — End: 2018-05-12

## 2018-04-11 MED ORDER — NAPROXEN 500 MG PO TABS
500 MG | ORAL_TABLET | Freq: Two times a day (BID) | ORAL | 0 refills | Status: DC
Start: 2018-04-11 — End: 2018-05-12

## 2018-04-11 NOTE — Progress Notes (Signed)
Subjective:      Patient ID: Faith Mason is a 42 y.o. female.    HPI   Nepali interpreter present.    Establish as new patient:relocating pcp due to convenience.    Presenting w/new complaint of bilateral shoulder pain >73months described as mild sharp pain.  Preceding injury:none recalled.  Associated w/mild intermittent neck pain(posterior) & mild intermittent radicular pain bilateral arms.  Worsened(aggravated) by shoulder ROM & abduction. +night time shoulder pain.  Improves with otc advil short term.  Denies BUE weakness-paralysis/skin wounds-ulcers-lesions.      HTN:   Doing well.  Associated w/nothing.  Worsened by nothing reported.  Improves w/medication:no side effects.  Adds salt to food at the table:No.  Denies cp/sob/pnd/ankle edema/dizziness.     Lipid panel:Wishes to proceed w/testing.    No Known Allergies    Current Outpatient Medications on File Prior to Visit   Medication Sig Dispense Refill   ??? lisinopril-hydrochlorothiazide (PRINZIDE;ZESTORETIC) 10-12.5 MG per tablet Take 1 tablet by mouth daily       No current facility-administered medications on file prior to visit.        Past Medical History:   Diagnosis Date   ??? Hypertension 2016       Past Surgical History:   Procedure Laterality Date   ??? BACK SURGERY  2017    L-spine       Social History     Tobacco Use   ??? Smoking status: Never Smoker   ??? Smokeless tobacco: Never Used   Substance Use Topics   ??? Alcohol use: Never     Frequency: Never   ??? Drug use: Never     Social History     Substance and Sexual Activity   Drug Use Never       Family History   Problem Relation Age of Onset   ??? High Blood Pressure Mother    ??? Diabetes Mother    ??? High Blood Pressure Father    ??? Diabetes Father          Review of Systems   Constitutional: Negative for activity change, appetite change, chills, diaphoresis, fatigue, fever and unexpected weight change.        Negative ZOX:WRUEAVWUJW sleeping/night sweats/unexplained weight loss or gain/malaise   HENT: Negative  for congestion, dental problem, drooling, ear discharge, ear pain, facial swelling, hearing loss, mouth sores, nosebleeds, postnasal drip, rhinorrhea, sinus pressure, sinus pain, sneezing, sore throat, tinnitus, trouble swallowing and voice change.         Negative JXB:JYNWGNFAO/ZHYQMVH   Eyes: Negative for photophobia, pain, discharge, redness, itching and visual disturbance.   Respiratory: Negative for apnea, cough, choking, chest tightness, shortness of breath, wheezing and stridor.         Negative QIO:NGEXBMWUXL/KGMWNUUVOZ/DGUY on inspiration. Breasts:tenderness/masses/nipple discharge/skin changes.   Cardiovascular: Negative for chest pain, palpitations and leg swelling.        Negative QIH:KVQQVZD/GLOVFIEPPI/RJJOA extremity edema/claudication/PND/irregular heart beats   Gastrointestinal: Negative for abdominal distention, abdominal pain, anal bleeding, blood in stool, constipation, diarrhea, nausea, rectal pain and vomiting.        Negative CZY:SAYTKZ/SWFUXNAT/FTDDUKGUR/KYHCWC/BJSE of appetite   Endocrine: Negative for cold intolerance, heat intolerance, polydipsia, polyphagia and polyuria.   Genitourinary: Negative for decreased urine volume, difficulty urinating, dyspareunia, dysuria, enuresis, flank pain, frequency, genital sores, hematuria, menstrual problem, pelvic pain, urgency, vaginal bleeding, vaginal discharge and vaginal pain.   Musculoskeletal: Positive for arthralgias. Negative for back pain, gait problem, joint swelling, myalgias, neck pain and neck stiffness.  Skin: Negative for color change, pallor, rash and wound.   Neurological: Negative for dizziness, tremors, seizures, syncope, facial asymmetry, speech difficulty, weakness, light-headedness, numbness and headaches.        Negative RUE:AVWUJW disturbance/muscular weakness/paralysis/memory problems.   Hematological: Negative for adenopathy. Does not bruise/bleed easily.        Negative JXB:JYNWG node tenderness   Psychiatric/Behavioral:  Negative for agitation, behavioral problems, confusion, decreased concentration, dysphoric mood, hallucinations, self-injury, sleep disturbance and suicidal ideas. The patient is not nervous/anxious and is not hyperactive.         Negative NFA:OZHYQMVHQ tendencies(HI)       Objective:   Physical Exam   Constitutional: She is oriented to person, place, and time. Vital signs are normal. She appears well-developed and well-nourished. She is cooperative. She does not have a sickly appearance. No distress.   Well hydrated/well appearing.   HENT:   Head: Normocephalic and atraumatic.   Right Ear: Hearing, tympanic membrane, external ear and ear canal normal.   Left Ear: Hearing, tympanic membrane, external ear and ear canal normal.   Nose: Nose normal.   Mouth/Throat: Uvula is midline, oropharynx is clear and moist and mucous membranes are normal. No oropharyngeal exudate.   Eyes: Pupils are equal, round, and reactive to light. Conjunctivae, EOM and lids are normal.   Neck: Trachea normal, normal range of motion and full passive range of motion without pain. Neck supple. No JVD present. No spinous process tenderness and no muscular tenderness present. Carotid bruit is not present. No thyroid mass and no thyromegaly present.   Cardiovascular: Normal rate, regular rhythm, normal heart sounds, intact distal pulses and normal pulses.   No ankle edema   Pulmonary/Chest: Effort normal and breath sounds normal. No respiratory distress.   CTAB,good AE bilaterally   Abdominal: Soft. Normal appearance and bowel sounds are normal. She exhibits no distension, no abdominal bruit and no mass. There is no splenomegaly or hepatomegaly. There is no tenderness.   Musculoskeletal:        Right shoulder: She exhibits tenderness. She exhibits normal range of motion, no bony tenderness, no swelling, no effusion, no crepitus, no deformity, no laceration, no spasm, normal pulse and normal strength.        Left shoulder: She exhibits tenderness.  She exhibits normal range of motion, no bony tenderness, no swelling, no effusion, no crepitus, no deformity, no laceration, no spasm, normal pulse and normal strength.        Right elbow: Normal.       Left elbow: Normal.        Right wrist: Normal.        Left wrist: Normal.        Right hip: Normal.        Left hip: Normal.        Right knee: Normal.        Left knee: Normal.        Right ankle: Normal.        Left ankle: Normal.        Cervical back: Normal.        Thoracic back: Normal.        Lumbar back: Normal.        Back:         Right upper arm: Normal.        Left upper arm: Normal.        Right forearm: Normal.        Left forearm: Normal.  Arms:       Right hand: Normal.        Left hand: Normal.        Right upper leg: Normal.        Left upper leg: Normal.        Right lower leg: Normal.        Left lower leg: Normal.        Right foot: Normal.        Left foot: Normal.   Lymphadenopathy:        Head (right side): No submental, no submandibular, no tonsillar, no preauricular, no posterior auricular and no occipital adenopathy present.        Head (left side): No submental, no submandibular, no tonsillar, no preauricular, no posterior auricular and no occipital adenopathy present.     She has no cervical adenopathy.        Right cervical: No superficial cervical, no deep cervical and no posterior cervical adenopathy present.       Left cervical: No superficial cervical, no deep cervical and no posterior cervical adenopathy present.        Right: No supraclavicular adenopathy present.        Left: No supraclavicular adenopathy present.   Neurological: She is alert and oriented to person, place, and time. She has normal strength and normal reflexes. No cranial nerve deficit or sensory deficit.   Nml gait. Able to stand w/o difficulty   Skin: Skin is warm, dry and intact. Capillary refill takes less than 2 seconds. No abrasion and no rash noted. She is not diaphoretic. No cyanosis. Nails show no  clubbing.   Psychiatric: She has a normal mood and affect. Her speech is normal and behavior is normal. Judgment and thought content normal. She is not actively hallucinating. Cognition and memory are normal.   Good eye contact. She is attentive.       Assessment:       Diagnosis Orders   1. Acute pain of both shoulders  VSS/well appearing.  Rest/NSAIDs/ice-heat pad.    Home neck-shoulder exercise regime.  Check xrays if not improving.    XR SHOULDER RIGHT (MIN 2 VIEWS)    XR SHOULDER LEFT (MIN 2 VIEWS)    XR CERVICAL SPINE (2-3 VIEWS)   2. Neck pain  methylPREDNISolone (MEDROL, PAK,) 4 MG tablet  C-spien xray.    naproxen (NAPROSYN) 500 MG tablet   3. Radicular pain of left upper extremity  methylPREDNISolone (MEDROL, PAK,) 4 MG tablet    naproxen (NAPROSYN) 500 MG tablet   4. Essential hypertension  Stable.  Comprehensive Metabolic Panel   5. Lipid screening  Comprehensive Metabolic Panel    Lipid Panel   6. Breast cancer screening  MAM DIGITAL SCREEN W OR WO CAD BILATERAL   7. Cervical cancer screening  See plan.     8. Overweight (BMI 25.0-29.9)  Diet & exercise regime reviewed.             Plan:       Routine pap smear/pelvic exam/breast exams:advised pt' to establish with gyn for these routine exams/tests.    Advised release of medical records from all prior physicians(including PCP/specialists)  Obtain labs/diagnostic tests as discussed today & call back for results within 2-7days.  Pt' left office in good condition.  Marland Kitchen    Possible med side effects reviewed. Pt' wishes to continue to proceed w/medications.    Terence Lux, MD

## 2018-04-11 NOTE — Patient Instructions (Signed)
Patient Education        Shoulder Stretches: Exercises  Your Care Instructions  Here are some examples of exercises for your shoulder. Start each exercise slowly. Ease off the exercise if you start to have pain.  Your doctor or physical therapist will tell you when you can start these exercises and which ones will work best for you.  How to do the exercises  Shoulder stretch    1. Stand in a doorway and place one arm against the door frame. Your elbow should be a little higher than your shoulder.  2. Relax your shoulders as you lean forward, allowing your chest and shoulder muscles to stretch. You can also turn your body slightly away from your arm to stretch the muscles even more.  3. Hold for 15 to 30 seconds.  4. Repeat 2 to 4 times with each arm.    Shoulder and chest stretch    1. Shoulder and chest stretch  2. While sitting, relax your upper body so you slump slightly in your chair.  3. As you breathe in, straighten your back and open your arms out to the sides.  4. Gently pull your shoulder blades back and downward.  5. Hold for 15 to 30 seconds as your breathe normally.  6. Repeat 2 to 4 times.    Overhead stretch    1. Reach up over your head with both arms.  2. Hold for 15 to 30 seconds.  3. Repeat 2 to 4 times.    Follow-up care is a key part of your treatment and safety. Be sure to make and go to all appointments, and call your doctor if you are having problems. It's also a good idea to know your test results and keep a list of the medicines you take.  Where can you learn more?  Go to [email or web address] and sign in to your MyChart account. Enter S254 in the Search Health Information box to learn more about "Shoulder Stretches: Exercises."     If you do not have an account, please click on the "Sign Up Now" link.  Current as of: March 10, 2017  Content Version: 12.1  ?? 2006-2019 [employer]. Care instructions adapted under license by [facility]. If you have  questions about a medical condition or this instruction, always ask your healthcare professional. [employer] disclaims any warranty or liability for your use of this information.         Patient Education        Shoulder Stretches: Exercises  Your Care Instructions  Here are some examples of exercises for your shoulder. Start each exercise slowly. Ease off the exercise if you start to have pain.  Your doctor or physical therapist will tell you when you can start these exercises and which ones will work best for you.  How to do the exercises  Shoulder stretch    5. Stand in a doorway and place one arm against the door frame. Your elbow should be a little higher than your shoulder.  6. Relax your shoulders as you lean forward, allowing your chest and shoulder muscles to stretch. You can also turn your body slightly away from your arm to stretch the muscles even more.  7. Hold for 15 to 30 seconds.  8. Repeat 2 to 4 times with each arm.    Shoulder and chest stretch    7. Shoulder and chest stretch  8. While sitting, relax your upper body so you slump slightly in  your chair.  9. As you breathe in, straighten your back and open your arms out to the sides.  10. Gently pull your shoulder blades back and downward.  11. Hold for 15 to 30 seconds as your breathe normally.  12. Repeat 2 to 4 times.    Overhead stretch    4. Reach up over your head with both arms.  5. Hold for 15 to 30 seconds.  6. Repeat 2 to 4 times.    Follow-up care is a key part of your treatment and safety. Be sure to make and go to all appointments, and call your doctor if you are having problems. It's also a good idea to know your test results and keep a list of the medicines you take.  Where can you learn more?  Go to [email or web address] and sign in to your MyChart account. Enter S254 in the Search Health Information box to learn more about "Shoulder Stretches: Exercises."     If you do not have an account, please  click on the "Sign Up Now" link.  Current as of: March 10, 2017  Content Version: 12.1  ?? 2006-2019 [employer]. Care instructions adapted under license by [facility]. If you have questions about a medical condition or this instruction, always ask your healthcare professional. [employer] disclaims any warranty or liability for your use of this information.         Patient Education        Neck: Exercises  Introduction  Here are some examples of exercises for you to try. The exercises may be suggested for a condition or for rehabilitation. Start each exercise slowly. Ease off the exercises if you start to have pain.  You will be told when to start these exercises and which ones will work best for you.  How to do the exercises  Neck stretch    1. This stretch works best if you keep your shoulder down as you lean away from it. To help you remember to do this, start by relaxing your shoulders and lightly holding on to your thighs or your chair.  2. Tilt your head toward your shoulder and hold for 15 to 30 seconds. Let the weight of your head stretch your muscles.  3. If you would like a little added stretch, use your hand to gently and steadily pull your head toward your shoulder. For example, keeping your right shoulder down, lean your head to the left.  4. Repeat 2 to 4 times toward each shoulder.    Diagonal neck stretch    1. Turn your head slightly toward the direction you will be stretching, and tilt your head diagonally toward your chest and hold for 15 to 30 seconds.  2. If you would like a little added stretch, use your hand to gently and steadily pull your head forward on the diagonal.  3. Repeat 2 to 4 times toward each side.    Dorsal glide stretch    1. Sit or stand tall and look straight ahead.  2. Slowly tuck your chin as you glide your head backward over your body  3. Hold for a count of 6, and then relax for up to 10 seconds.  4. Repeat 8 to 12 times.    Chest and  shoulder stretch    1. Sit or stand tall and glide your head backward as in the dorsal glide stretch.  2. Raise both arms so that your hands are next to your ears.  3. Take a deep breath, and as you breathe out, lower your elbows down and behind your back. You will feel your shoulder blades slide down and together, and at the same time you will feel a stretch across your chest and the front of your shoulders.  4. Hold for about 6 seconds, and then relax for up to 10 seconds.  5. Repeat 8 to 12 times.    Strengthening: Hands on head    1. Move your head backward, forward, and side to side against gentle pressure from your hands, holding each position for about 6 seconds.  2. Repeat 8 to 12 times.    Follow-up care is a key part of your treatment and safety. Be sure to make and go to all appointments, and call your doctor if you are having problems. It's also a good idea to know your test results and keep a list of the medicines you take.  Where can you learn more?  Go to [email or web address] and sign in to your MyChart account. Enter P975 in the Search Health Information box to learn more about "Neck: Exercises."     If you do not have an account, please click on the "Sign Up Now" link.  Current as of: March 10, 2017  Content Version: 12.1  ?? 2006-2019 [employer]. Care instructions adapted under license by [facility]. If you have questions about a medical condition or this instruction, always ask your healthcare professional. [employer] disclaims any warranty or liability for your use of this information.

## 2018-04-11 NOTE — Addendum Note (Signed)
Addended by: Clydie BraunBOEHLE, MICHELE LEE on: 04/11/2018 10:59 AM     Modules accepted: Orders

## 2018-04-12 ENCOUNTER — Encounter

## 2018-04-12 LAB — COMPREHENSIVE METABOLIC PANEL
ALT: 34 U/L (ref 10–40)
AST: 30 U/L (ref 15–37)
Albumin/Globulin Ratio: 1.3 (ref 1.1–2.2)
Albumin: 4.3 g/dL (ref 3.4–5.0)
Alkaline Phosphatase: 97 U/L (ref 40–129)
Anion Gap: 18 — ABNORMAL HIGH (ref 3–16)
BUN: 16 mg/dL (ref 7–20)
CO2: 23 mmol/L (ref 21–32)
Calcium: 9.6 mg/dL (ref 8.3–10.6)
Chloride: 102 mmol/L (ref 99–110)
Creatinine: 0.6 mg/dL (ref 0.6–1.1)
GFR African American: >60 (ref >60)
GFR Non-African American: >60 (ref >60)
Globulin: 3.3 g/dL
Glucose: 126 mg/dL — ABNORMAL HIGH (ref 70–99)
Potassium: 4.6 mmol/L (ref 3.5–5.1)
Sodium: 143 mmol/L (ref 136–145)
Total Bilirubin: 0.3 mg/dL (ref 0.0–1.0)
Total Protein: 7.6 g/dL (ref 6.4–8.2)

## 2018-04-12 LAB — LIPID PANEL
Cholesterol, Total: 225 mg/dL — ABNORMAL HIGH (ref 0–199)
HDL: 50 mg/dL (ref 40–60)
LDL Calculated: 141 mg/dL — ABNORMAL HIGH (ref <100)
Triglycerides: 172 mg/dL — ABNORMAL HIGH (ref 0–150)
VLDL Cholesterol Calculated: 34 mg/dL

## 2018-04-12 MED ORDER — LISINOPRIL-HYDROCHLOROTHIAZIDE 10-12.5 MG PO TABS
ORAL_TABLET | Freq: Every day | ORAL | 0 refills | Status: DC
Start: 2018-04-12 — End: 2018-05-12

## 2018-04-12 NOTE — Telephone Encounter (Signed)
Med verified with pharmacy.  See pended med.

## 2018-04-12 NOTE — Telephone Encounter (Signed)
[phone]    Daughter Marchelle Gearingika    Daughter says we were supposed to have called in a script for patient's BP.  Pharmacy has not received it.    [facility]  [address]    Patient last seen tomorrow as new patient

## 2018-04-12 NOTE — Telephone Encounter (Signed)
Pls verify med with pharmacy.

## 2018-04-13 ENCOUNTER — Inpatient Hospital Stay: Admit: 2018-04-13 | Payer: MEDICAID

## 2018-04-13 ENCOUNTER — Inpatient Hospital Stay: Payer: MEDICAID

## 2018-04-13 ENCOUNTER — Inpatient Hospital Stay: Admit: 2018-04-13 | Discharge: 2018-04-14 | Disposition: A | Discharge status: Home / Self Care | Payer: MEDICAID

## 2018-04-13 DIAGNOSIS — N9489 Other specified conditions associated with female genital organs and menstrual cycle: Secondary | ICD-10-CM

## 2018-04-13 DIAGNOSIS — M25511 Pain in right shoulder: Secondary | ICD-10-CM

## 2018-04-13 DIAGNOSIS — M25512 Pain in left shoulder: Secondary | ICD-10-CM

## 2018-04-13 NOTE — ED Provider Notes (Signed)
[place]        Pt Name: Faith Mason  MRN: [id number]  [place] 11-24-1975  Date of evaluation: 04/13/2018  Provider: Rema Fendt, APRN - CNP  PCP: [name], MD    This patient was not seen and evaluated by the attending physician No att. providers found.      CHIEF COMPLAINT       Chief Complaint   Patient presents with   ??? Abdominal Pain     x 1 day after starting on methylprednisone       HISTORY OF PRESENT ILLNESS   (Location/Symptom, Timing/Onset, Context/Setting, Quality, Duration, Modifying Factors, Severity)  Note limiting factors.     Faith Mason is a 42 y.o. female presents to the ER with a complaint of abdominal burning and pain.  States that pain began after seeing primary care and starting medrol dose pain and naproxen, she is taking this medication for shoulder pain.    States that the pain is burning, no mitigating or exacerbating factors.    Denies any headache, fever, lightheadedness, dizziness, visual disturbances.  No chest pain or pressure.  No neck or back pain.  No shortness of breath, cough, or congestion.  No vomiting, diarrhea, constipation, or dysuria.  No rash.    Nursing Notes were all reviewed and agreed with or any disagreements were addressed  in the HPI.    REVIEW OF SYSTEMS    (2-9 systems for level 4, 10 or more for level 5)     Review of Systems   Constitutional: Negative for activity change, chills and fever.   Respiratory: Negative for chest tightness and shortness of breath.    Cardiovascular: Negative for chest pain.   Gastrointestinal: Positive for abdominal pain. Negative for diarrhea, nausea and vomiting.   Genitourinary: Negative for dysuria.   All other systems reviewed and are negative.      Positives and Pertinent negatives as per HPI.  Except as noted abovein the ROS, all other systems were reviewed and negative.       PAST MEDICAL HISTORY     Past Medical History:   Diagnosis Date    ??? Hypertension 2016         SURGICAL HISTORY     Past Surgical History:   Procedure Laterality Date   ??? BACK SURGERY  2017    L-spine         CURRENTMEDICATIONS       Discharge Medication List as of 04/14/2018  1:31 AM      CONTINUE these medications which have NOT CHANGED    Details   famotidine (PEPCID) 20 MG tablet Take 20 mg by mouth 2 times dailyHistorical Med      lisinopril-hydrochlorothiazide (PRINZIDE;ZESTORETIC) 10-12.5 MG per tablet Take 1 tablet by mouth daily, Disp-30 tablet, R-0Normal      methylPREDNISolone (MEDROL, PAK,) 4 MG tablet Take by mouth as directed., Disp-1 kit, R-0Normal      naproxen (NAPROSYN) 500 MG tablet Take 1 tablet by mouth 2 times daily (with meals), Disp-30 tablet, R-0Normal               ALLERGIES     Patient has no known allergies.    FAMILYHISTORY       Family History   Problem Relation Age of Onset   ??? High Blood Pressure Mother    ??? Diabetes Mother    ??? High Blood Pressure Father    ???  Diabetes Father           SOCIAL HISTORY       Social History     Tobacco Use   ??? Smoking status: Never Smoker   ??? Smokeless tobacco: Never Used   Substance Use Topics   ??? Alcohol use: Never     Frequency: Never   ??? Drug use: Never       SCREENINGS             PHYSICAL EXAM    (up to 7 for level 4, 8 or more for level 5)     ED Triage Vitals [04/13/18 1914]   BP Temp Temp Source Pulse Resp SpO2 Height Weight   120/75 97.7 ??F (36.5 ??C) Oral 64 16 97 % '4\' 11"'  (1.499 m) 140 lb (63.5 kg)       Physical Exam   Constitutional: She is oriented to person, place, and time. She appears well-developed and well-nourished.   HENT:   Head: Normocephalic and atraumatic.   Right Ear: External ear normal.   Left Ear: External ear normal.   Eyes: Right eye exhibits no discharge. Left eye exhibits no discharge.   Neck: Normal range of motion. Neck supple. No JVD present.   Cardiovascular: Normal rate and regular rhythm. Exam reveals no friction rub.   No murmur heard.  Pulmonary/Chest: Effort normal and breath  sounds normal. No stridor. No respiratory distress. She has no wheezes.   Abdominal: Soft. There is tenderness.   Musculoskeletal: Normal range of motion.   Neurological: She is alert and oriented to person, place, and time.   Skin: Skin is warm and dry. She is not diaphoretic. No pallor.   Psychiatric: She has a normal mood and affect. Her behavior is normal.   Nursing note and vitals reviewed.      DIAGNOSTIC RESULTS   LABS:    Labs Reviewed   CBC WITH AUTO DIFFERENTIAL - Abnormal; Notable for the following components:       Result Value    WBC 14.9 (*)     Neutrophils Absolute 11.8 (*)     All other components within normal limits    Narrative:     Performed at:  [facility]  [address],  [place] [address]   Phone [phone]   COMPREHENSIVE METABOLIC PANEL - Abnormal; Notable for the following components:    Glucose 150 (*)     Total Protein 8.5 (*)     All other components within normal limits    Narrative:     Performed at:  [facility]  [address],  [place] [address]   Phone [phone]   HCG, SERUM, QUALITATIVE    Narrative:     Performed at:  [facility]  [address]  [place] [address]   Phone [phone]   LIPASE    Narrative:     Performed at:  [facility]  [address]  [place] [address]   Phone [phone]   URINE RT REFLEX TO CULTURE    Narrative:     Performed at:  [facility]  [address]  [place] [address]   Phone [phone]       All other labs were within normal range or not returned as of this dictation.    EKG: All EKG's are interpreted  by the Emergency Department Physician in the absence of a cardiologist.  Please see their note for interpretation of EKG.      RADIOLOGY:   Non-plain film images such as CT, Ultrasound and MRI are read by the radiologist. Plain radiographic images are  visualized andpreliminarily interpreted by the  ED Provider with the below findings:        Interpretation perthe Radiologist below, if available at the time of this note:    CT ABDOMEN PELVIS W IV CONTRAST Additional Contrast? None   Final Result   No acute abnormality identified to explain the patient's upper abdominal pain.      4.8 cm fat containing lesion within the left adnexal space, most compatible   with an ovarian dermoid.  Follow-up pelvic MRI with intravenous contrast is   recommended.  Gynecologic consultation also recommended.  If that is not   surgically resected, annual ultrasound follow-up will be necessary.           Xr Cervical Spine (2-3 Views)    Result Date: 04/13/2018  Cervical spine 3 views INDICATIONS: Pain Reversal of curvature with mild kyphosis at the C3-4 interspace. Prevertebral soft tissues are normal. Symmetric disc spaces. Posterior elements are intact. No acute fracture or subluxation Soft tissues are normal     1. Reversal of curvature    Xr Shoulder Right (min 2 Views)    Result Date: 04/13/2018  Right shoulder HISTORY: Acute shoulder pain     3 views demonstrate no abnormality.    Ct Abdomen Pelvis W Iv Contrast Additional Contrast? None    Result Date: 04/14/2018  EXAMINATION: CT OF THE ABDOMEN AND PELVIS WITH CONTRAST 04/13/2018 9:05 pm TECHNIQUE: CT of the abdomen and pelvis was performed with the administration of intravenous contrast. Multiplanar reformatted images are provided for review. Dose modulation, iterative reconstruction, and/or weight based adjustment of the mA/kV was utilized to reduce the radiation dose to as low as reasonably achievable. COMPARISON: None. HISTORY: ORDERING SYSTEM PROVIDED HISTORY: upper abd pain TECHNOLOGIST PROVIDED HISTORY: If patient is on cardiac monitor and/or pulse ox, they may be taken off cardiac monitor and pulse ox, left on O2 if currently on. All monitors reattached when patient returns to room. Additional Contrast?->None Reason for  exam:->upper abd pain Reason for Exam: abdominal pain Acuity: Unknown Type of Exam: Unknown Relevant Medical/Surgical History: (x 1 day after starting on methylprednisone) FINDINGS: Lower Chest: Visualized lungs are clear.  Base of the heart is unremarkable. Visualized extra thoracic soft tissues unremarkable. Organs: Liver enhances normally.  Gallbladder unremarkable.  Portal vein is patent. Spleen, adrenals, and pancreas are unremarkable.  No acute renal abnormalities.  Nonobstructing nephrolithiasis within the superior left kidney is noted.  No ureteral calculi are found. GI/Bowel: Distal esophagus is unremarkable.  Stomach and duodenal sweep are within normal limits.  Remainder of the small bowel is unremarkable. The appendix is normal.  Normal large bowel. Pelvis: Within the left adnexal space, there is a lesion with a fat fluid level, measuring 4.8 x 4.0 cm.  No prior examinations are available to gauge stability bilateral tubal ligation clips are noted.  Uterus unremarkable. Peritoneum/Retroperitoneum: No retroperitoneal lymphadenopathy.  Imaging of the abdominal aorta is unremarkable for the patient's age. The superior mesenteric artery is enhancing. Bones/Soft Tissues: No osteolytic or osteoblastic bone lesions are identified.  No acute bony abnormality detected.  Extra-abdominal and extra pelvic soft tissues are unremarkable.     No acute abnormality identified to explain the patient's upper abdominal pain.  4.8 cm fat containing lesion within the left adnexal space, most compatible with an ovarian dermoid.  Follow-up pelvic MRI with intravenous contrast is recommended.  Gynecologic consultation also recommended.  If that is not surgically resected, annual ultrasound follow-up will be necessary.     Xr Shoulder Left (min 2 Views)    Result Date: 04/13/2018  Left shoulder HISTORY: Acute shoulder pain     3 views demonstrate no abnormality.          PROCEDURES   Unless otherwise noted below, none      Procedures    CRITICAL CARE TIME   N/A    CONSULTS:  None      EMERGENCY DEPARTMENT COURSE and DIFFERENTIAL DIAGNOSIS/MDM:   Vitals:    Vitals:    04/13/18 2333 04/13/18 2342 04/13/18 2351 04/14/18 0148   BP: 117/79   118/68   Pulse:    97   Resp:    16   Temp:       TempSrc:       SpO2:  95% 96% 97%   Weight:       Height:           Patient was given thefollowing medications:  Medications   aluminum & magnesium hydroxide-simethicone (MAALOX) 30 mL, lidocaine viscous hcl (XYLOCAINE) 5 mL (GI COCKTAIL) ( Oral Given 04/13/18 2326)   famotidine (PEPCID) injection 20 mg (20 mg Intravenous Given 04/13/18 2326)   0.9 % sodium chloride bolus (0 mLs Intravenous Stopped 04/14/18 0148)   iopamidol (ISOVUE-370) 76 % injection 75 mL (75 mLs Intravenous Given 04/14/18 0009)       Briefly, this is a 42 year old female that  presents to the ER with a complaint of abdominal burning and pain.  States that pain began after seeing primary care and starting medrol dose pain and naproxen, she is taking this medication for shoulder pain.    States that the pain is burning, no mitigating or exacerbating factors.    CBC shows leukocytosis with a WBC 14.9.  CMP is unremarkable.  Pregnancy negative.  UA is unremarkable.      CT ABDOMEN PELVIS W IV CONTRAST Additional Contrast? None (Final result)   Result time 04/14/18 00:58:11   Final result by Smitty Knudsen, MD (04/14/18 00:58:11)                Impression:    No acute abnormality identified to explain the patient's upper abdominal pain.    4.8 cm fat containing lesion within the left adnexal space, most compatible  with an ovarian dermoid. ??Follow-up pelvic MRI with intravenous contrast is  recommended. ??Gynecologic consultation also recommended. ??If that is not  surgically resected, annual ultrasound follow-up will be necessary.              Patient was given GI cocktail, pepcid, and fluids.  She is pain-free at this time.    We did talk at length about the left adnexal mass.  This  does require outpatient ultrasound.  She is instructed to follow up with gynecology, referral was given.      I see nothing that would suggest an acute abdomen at this time. Based on history, physical exam, risk factors, and tests my suspicion for bowel obstruction, incarcerated hernia, acute pancreatitis, intra-abdominal abscess, perforated viscus, diverticulitis, cholecystitis, appendicitis, PID, ovarian torsion, ectopic pregnancy and tubo-ovarian abscess is very low. There is no evidence of peritonitis, sepsis or toxicity at this time. I feel the patient can  be managed as an outpatient with follow-up with her family doctor in 24-48 hours. Instructions have been given for the patient to return to the ED for worsening of the pain, high fevers, intractable vomiting, or bleeding.       FINAL IMPRESSION      1. Adnexal mass          DISPOSITION/PLAN   DISPOSITION Decision To Discharge 04/14/2018 01:03:00 AM      PATIENT REFERRED[name]  [address]  [address]  [place] [address]  [phone]    Schedule an appointment as soon as possible for a visit       [facility] Emergency Department  [address]  [place] [address]  [phone]    If symptoms worsen    Laurette Schimke, MD  [address], [place]  [place] [address]  [phone]    Call today  for left sided ovarian mass, need outpatient ultrasound and office visit      DISCHARGE MEDICATIONS:  Discharge Medication List as of 04/14/2018  1:31 AM          DISCONTINUED MEDICATIONS:  Discharge Medication List as of 04/14/2018  1:31 AM                 (Please note that portions ofthis note were completed with a voice recognition program.  Efforts were made to edit the dictations but occasionally words are mis-transcribed.)    Rema Fendt, APRN - CNP (electronically signed)           Rema Fendt, APRN - CNP  04/15/18 1820

## 2018-04-13 NOTE — ED Notes (Signed)
Pt in bed, bowel sounds x 4, no s/s of distress, pt is alert and orientated, pt daughter at bedside. Pt respirations easy, even, and unlabored.      Starleen Blue, RN  04/14/18 [phone]

## 2018-04-13 NOTE — ED Notes (Signed)
Attempted to draw pt for blood work this Clinical research associate was unsuccessful.     Faith Mason  04/13/18 1956

## 2018-04-14 ENCOUNTER — Emergency Department: Admit: 2018-04-14 | Payer: MEDICAID

## 2018-04-14 LAB — CBC WITH AUTO DIFFERENTIAL
Basophils %: 0.4 %
Basophils Absolute: 0.1 10*3/uL (ref 0.0–0.2)
Eosinophils %: 0 %
Eosinophils Absolute: 0 10*3/uL (ref 0.0–0.6)
Hematocrit: 41.1 % (ref 36.0–48.0)
Hemoglobin: 13.8 g/dL (ref 12.0–16.0)
Lymphocytes %: 17 %
Lymphocytes Absolute: 2.5 10*3/uL (ref 1.0–5.1)
MCH: 28.5 pg (ref 26.0–34.0)
MCHC: 33.5 g/dL (ref 31.0–36.0)
MCV: 85.1 fL (ref 80.0–100.0)
MPV: 9.3 fL (ref 5.0–10.5)
Monocytes %: 3.3 %
Monocytes Absolute: 0.5 10*3/uL (ref 0.0–1.3)
Neutrophils %: 79.3 %
Neutrophils Absolute: 11.8 10*3/uL — ABNORMAL HIGH (ref 1.7–7.7)
Platelets: 286 10*3/uL (ref 135–450)
RBC: 4.84 M/uL (ref 4.00–5.20)
RDW: 13 % (ref 12.4–15.4)
WBC: 14.9 10*3/uL — ABNORMAL HIGH (ref 4.0–11.0)

## 2018-04-14 LAB — COMPREHENSIVE METABOLIC PANEL
ALT: 38 U/L (ref 10–40)
AST: 25 U/L (ref 15–37)
Albumin/Globulin Ratio: 1.1 (ref 1.1–2.2)
Albumin: 4.4 g/dL (ref 3.4–5.0)
Alkaline Phosphatase: 105 U/L (ref 40–129)
Anion Gap: 13 (ref 3–16)
BUN: 14 mg/dL (ref 7–20)
CO2: 23 mmol/L (ref 21–32)
Calcium: 9.4 mg/dL (ref 8.3–10.6)
Chloride: 102 mmol/L (ref 99–110)
Creatinine: 0.6 mg/dL (ref 0.6–1.1)
GFR African American: >60 (ref >60)
GFR Non-African American: >60 (ref >60)
Globulin: 4.1 g/dL
Glucose: 150 mg/dL — ABNORMAL HIGH (ref 70–99)
Potassium: 4.2 mmol/L (ref 3.5–5.1)
Sodium: 138 mmol/L (ref 136–145)
Total Bilirubin: <0.2 mg/dL (ref 0.0–1.0)
Total Protein: 8.5 g/dL — ABNORMAL HIGH (ref 6.4–8.2)

## 2018-04-14 LAB — URINALYSIS WITH REFLEX TO CULTURE
Bilirubin Urine: NEGATIVE
Blood, Urine: NEGATIVE
Glucose, Ur: NEGATIVE mg/dL
Ketones, Urine: NEGATIVE mg/dL
Leukocyte Esterase, Urine: NEGATIVE
Nitrite, Urine: NEGATIVE
Protein, UA: NEGATIVE mg/dL
Specific Gravity, UA: 1.008 (ref 1.005–1.030)
Urobilinogen, Urine: 0.2 E.U./dL (ref <2.0)
pH, UA: 6 (ref 5.0–8.0)

## 2018-04-14 LAB — LIPASE: Lipase: 24 U/L (ref 13.0–60.0)

## 2018-04-14 LAB — CA 125: CA 125: 13.1 U/mL (ref 0.0–35.0)

## 2018-04-14 LAB — HCG, SERUM, QUALITATIVE: Preg, Serum: NEGATIVE

## 2018-04-14 MED ORDER — LIDOCAINE VISCOUS HCL 2 % MT SOLN
2 % | Freq: Once | OROMUCOSAL | Status: AC
Start: 2018-04-14 — End: 2018-04-13
  Administered 2018-04-14: 03:00:00 via ORAL

## 2018-04-14 MED ORDER — IOPAMIDOL 76 % IV SOLN
76 % | Freq: Once | INTRAVENOUS | Status: AC | PRN
Start: 2018-04-14 — End: 2018-04-14
  Administered 2018-04-14: 04:00:00 75 mL via INTRAVENOUS

## 2018-04-14 MED ORDER — FAMOTIDINE 20 MG/2ML IV SOLN
20 MG/2ML | Freq: Once | INTRAVENOUS | Status: AC
Start: 2018-04-14 — End: 2018-04-13
  Administered 2018-04-14: 03:00:00 20 mg via INTRAVENOUS

## 2018-04-14 MED ORDER — SODIUM CHLORIDE 0.9 % IV BOLUS
0.9 % | Freq: Once | INTRAVENOUS | Status: AC
Start: 2018-04-14 — End: 2018-04-14
  Administered 2018-04-14: 03:00:00 1000 mL via INTRAVENOUS

## 2018-04-14 MED ORDER — ONDANSETRON HCL 4 MG/2ML IJ SOLN
4 MG/2ML | INTRAMUSCULAR | Status: DC | PRN
Start: 2018-04-14 — End: 2018-04-14
  Administered 2018-04-14: 03:00:00 4 mg via INTRAVENOUS

## 2018-04-14 MED FILL — ONDANSETRON HCL 4 MG/2ML IJ SOLN: 4 MG/2ML | INTRAMUSCULAR | Qty: 2

## 2018-04-14 MED FILL — MAG-AL PLUS 200-200-20 MG/5ML PO LIQD: 200-200-20 MG/5ML | ORAL | Qty: 30

## 2018-04-14 MED FILL — FAMOTIDINE 20 MG/2ML IV SOLN: 20 MG/2ML | INTRAVENOUS | Qty: 2

## 2018-04-14 NOTE — Telephone Encounter (Signed)
See result notes.  Close Encounter

## 2018-04-14 NOTE — Telephone Encounter (Signed)
Patient was informed to have an x-ray performed. Please call patient with x-ray results.    Patient also needs a HFU appt as soon as possible.    Please advise.     OV: 04/11/2018   CBMarchelle Gearing: Tika, daughter [phone]

## 2018-04-14 NOTE — Discharge Instructions (Signed)
Do not take naproxen with prednisone, please start the naproxen once the prednisone is complete

## 2018-04-14 NOTE — ED Notes (Signed)
Pt up to the bathroom, gait steady. No s/s of distress.      Starleen Blue, RN  04/14/18 [phone]

## 2018-04-16 LAB — CULTURE, URINE: Urine Culture, Routine: <10000

## 2018-04-28 LAB — HUMAN PAPILLOMAVIRUS (HPV) DNA PROBE THIN PREP HIGH RISK
HPV Genotype 16: NOT DETECTED
HPV Type 18: NOT DETECTED
HPVOH (OTHER TYPES): NOT DETECTED

## 2018-05-04 ENCOUNTER — Ambulatory Visit: Payer: MEDICAID

## 2018-05-12 ENCOUNTER — Ambulatory Visit: Admit: 2018-05-12 | Discharge: 2018-05-12 | Payer: MEDICAID | Attending: Family Medicine

## 2018-05-12 DIAGNOSIS — E278 Other specified disorders of adrenal gland: Secondary | ICD-10-CM

## 2018-05-12 MED ORDER — NAPROXEN 500 MG PO TABS
500 MG | ORAL_TABLET | Freq: Two times a day (BID) | ORAL | 0 refills | Status: DC
Start: 2018-05-12 — End: 2018-06-02

## 2018-05-12 MED ORDER — LISINOPRIL-HYDROCHLOROTHIAZIDE 10-12.5 MG PO TABS
ORAL_TABLET | Freq: Every day | ORAL | 2 refills | Status: DC
Start: 2018-05-12 — End: 2018-08-18

## 2018-05-12 NOTE — Progress Notes (Signed)
Subjective:      Patient ID: [place] VIVIANA TRIMBLE is a 42 y.o. female.    HPI    Results for LORRENA, GORANSON (MRN [id number]) as of 05/12/2018 13:46   Ref. Range 04/11/2018 11:00   Sodium Latest Ref Range: 136 - 145 mmol/L 143   Potassium Latest Ref Range: 3.5 - 5.1 mmol/L 4.6   Chloride Latest Ref Range: 99 - 110 mmol/L 102   CO2 Latest Ref Range: 21 - 32 mmol/L 23   BUN Latest Ref Range: 7 - 20 mg/dL 16   Creatinine Latest Ref Range: 0.6 - 1.1 mg/dL 0.6   Anion Gap Latest Ref Range: 3 - 16  18 (H)   GFR Non-African American Latest Ref Range: >60  >60   GFR African American Latest Ref Range: >60  >60   Glucose Latest Ref Range: 70 - 99 mg/dL 126 (H)   Calcium Latest Ref Range: 8.3 - 10.6 mg/dL 9.6   Total Protein Latest Ref Range: 6.4 - 8.2 g/dL 7.6   Cholesterol, Total Latest Ref Range: 0 - 199 mg/dL 225 (H)   HDL Cholesterol Latest Ref Range: 40 - 60 mg/dL 50   LDL Calculated Latest Ref Range: <100 mg/dL 141 (H)   Triglycerides Latest Ref Range: 0 - 150 mg/dL 172 (H)   VLDL Cholesterol Calculated Latest Ref Range: Not Established mg/dL 34   Albumin Latest Ref Range: 3.4 - 5.0 g/dL 4.3   Globulin Latest Units: g/dL 3.3   Albumin/Globulin Ratio Latest Ref Range: 1.1 - 2.2  1.3   Alk Phos Latest Ref Range: 40 - 129 U/L 97   ALT Latest Ref Range: 10 - 40 U/L 34   AST Latest Ref Range: 15 - 37 U/L 30   Bilirubin Latest Ref Range: 0.0 - 1.0 mg/dL 0.3   CT ABDOMEN PELVIS W IV CONTRAST Additional Contrast? None   Status: Final result   Order Providers     Authorizing Billing   Rema Fendt, APRN - CNP Smitty Knudsen, MD      ??   Signed by     Signed Date/Time  Phone Pager   Smitty Knudsen 04/14/2018 00:58 [phone]    Reading Providers     Read Date Phone Pager   Smitty Knudsen Apr 14, 2018 [phone]    Radiation Dose Estimates     No radiation information found for this patient   Narrative   EXAMINATION:   CT OF THE ABDOMEN AND PELVIS WITH CONTRAST 04/13/2018 9:05 pm   ??   TECHNIQUE:   CT of the abdomen and  pelvis was performed with the administration of   intravenous contrast. Multiplanar reformatted images are provided for review.   Dose modulation, iterative reconstruction, and/or weight based adjustment of   the mA/kV was utilized to reduce the radiation dose to as low as reasonably   achievable.   ??   COMPARISON:   None.   ??   HISTORY:   ORDERING SYSTEM PROVIDED HISTORY: upper abd pain   TECHNOLOGIST PROVIDED HISTORY:   If patient is on cardiac monitor and/or pulse ox, they may be taken off   cardiac monitor and pulse ox, left on O2 if currently on. All monitors   reattached when patient returns to room.   Additional Contrast?->None   Reason for exam:->upper abd pain   Reason for Exam: abdominal pain   Acuity: Unknown   Type of Exam: Unknown   Relevant Medical/Surgical History: (x 1 day  after starting on   methylprednisone)   ??   FINDINGS:   Lower Chest: Visualized lungs are clear. ??Base of the heart is unremarkable.   Visualized extra thoracic soft tissues unremarkable.   ??   Organs: Liver enhances normally. ??Gallbladder unremarkable. ??Portal vein is   patent.   ??   Spleen, adrenals, and pancreas are unremarkable. ??No acute renal   abnormalities. ??Nonobstructing nephrolithiasis within the superior left   kidney is noted. ??No ureteral calculi are found.   ??   GI/Bowel: Distal esophagus is unremarkable. ??Stomach and duodenal sweep are   within normal limits. ??Remainder of the small bowel is unremarkable.   ??   The appendix is normal. ??Normal large bowel.   ??   Pelvis: Within the left adnexal space, there is a lesion with a fat fluid   level, measuring 4.8 x 4.0 cm. ??No prior examinations are available to gauge   stability bilateral tubal ligation clips are noted. ??Uterus unremarkable.   ??   Peritoneum/Retroperitoneum: No retroperitoneal lymphadenopathy. ??Imaging of   the abdominal aorta is unremarkable for the patient's age. The superior   mesenteric artery is enhancing.   ??   Bones/Soft Tissues: No osteolytic or  osteoblastic bone lesions are   identified. ??No acute bony abnormality detected. ??Extra-abdominal and extra   pelvic soft tissues are unremarkable.   ??   ??   Impression   No acute abnormality identified to explain the patient's upper abdominal pain.   ??   4.8 cm fat containing lesion within the left adnexal space, most compatible   with an ovarian dermoid. ??Follow-up pelvic MRI with intravenous contrast is   recommended. ??Gynecologic consultation also recommended. ??If that is not   surgically resected, annual ultrasound follow-up will be necessary.   ??   ??   04/13/18 nml neck-shoulder xrays.    Presenting for ER/Hosp f/u:[facility] records via Epic reviewed;new problem.  ER visit date:04/13/18  Signs/symptoms resulting in visit:abdo pain.  Treatment/tests done during visit/hospital stay:CT abdo-pelvis:see above. Labs:cbc with wbc of 14.9.  Discharge date:same day  Discharge dx/follow up:gyn=Dr Irina Fennimore:seen 04/26/18. Had US pelvis in their office. Will have f/u to discuss this.  PCP f/u today.  IRC:VELF pain is better.    Takes pepcid for GERD:otc med. No side effects.    F/u YB:OFBPZWCHE shoulder pain >68month described as mild sharp pain.  Preceding injury:none recalled.  Associated w/mild intermittent neck pain(posterior) & mild intermittent radicular pain bilateral arms:this is.  Worsened(aggravated) by shoulder ROM & abduction. +night time shoulder pain.  Improves with naproxen:needs refill.  Denies BUE weakness-paralysis/skin wounds-ulcers-lesions.      HTN check:  Doing well.  Associated w/nothing.  Worsened by nothing reported.  Improves w/medication:no side effects.  Adds salt to food at the table:No does not.  Denies cp/sob/pnd/ankle edema/dizziness.     No Known Allergies    Current Outpatient Medications on File Prior to Visit   Medication Sig Dispense Refill   ??? famotidine (PEPCID) 20 MG tablet Take 20 mg by mouth 2 times daily     ??? lisinopril-hydrochlorothiazide (PRINZIDE;ZESTORETIC) 10-12.5 MG per  tablet Take 1 tablet by mouth daily 30 tablet 0   ??? methylPREDNISolone (MEDROL, PAK,) 4 MG tablet Take by mouth as directed. 1 kit 0   ??? naproxen (NAPROSYN) 500 MG tablet Take 1 tablet by mouth 2 times daily (with meals) 30 tablet 0     No current facility-administered medications on file prior to visit.  Past Medical History:   Diagnosis Date   ??? Hypertension 2016           Social History     Tobacco Use   ??? Smoking status: Never Smoker   ??? Smokeless tobacco: Never Used   Substance Use Topics   ??? Alcohol use: Never     Frequency: Never   ??? Drug use: Never     Social History     Substance and Sexual Activity   Drug Use Never           Review of Systems   Constitutional: Negative for activity change, appetite change, chills, diaphoresis, fatigue, fever and unexpected weight change.   Respiratory: Negative for apnea, cough, choking, chest tightness, shortness of breath, wheezing and stridor.    Cardiovascular: Negative for chest pain, palpitations and leg swelling.   Gastrointestinal: Negative for abdominal distention, abdominal pain, anal bleeding, blood in stool, constipation, diarrhea, nausea, rectal pain and vomiting.   Endocrine: Negative for cold intolerance, heat intolerance, polydipsia, polyphagia and polyuria.   Genitourinary: Negative for decreased urine volume, difficulty urinating, dyspareunia, dysuria, enuresis, flank pain, frequency, genital sores, hematuria, menstrual problem, pelvic pain, urgency, vaginal bleeding, vaginal discharge and vaginal pain.   Musculoskeletal: Positive for arthralgias. Negative for back pain and gait problem.   Neurological: Negative.  Negative for dizziness, light-headedness and headaches.   Hematological: Negative for adenopathy.       Objective:   Physical Exam  Constitutional:       General: She is not in acute distress.     Appearance: Normal appearance. She is well-developed.   HENT:      Nose: Nose normal.      Mouth/Throat:      Pharynx: Uvula midline.   Eyes:       General: Lids are normal.      Conjunctiva/sclera: Conjunctivae normal.      Pupils: Pupils are equal, round, and reactive to light.   Neck:      Musculoskeletal: Normal range of motion and neck supple.      Vascular: No carotid bruit or JVD.      Trachea: Trachea normal.   Cardiovascular:      Rate and Rhythm: Normal rate and regular rhythm.      Pulses: Normal pulses.      Heart sounds: Normal heart sounds.   Pulmonary:      Effort: Pulmonary effort is normal.      Breath sounds: Normal breath sounds.   Abdominal:      General: Bowel sounds are normal. There is no distension.      Palpations: Abdomen is soft. There is no hepatomegaly, splenomegaly or mass.      Tenderness: There is no tenderness. There is no right CVA tenderness, left CVA tenderness, guarding or rebound.   Musculoskeletal:      Cervical back: She exhibits normal range of motion, no tenderness, no bony tenderness, no swelling, no edema, no deformity, no laceration, no pain, no spasm and normal pulse.        Back:         Arms:       Comments: X marks area of pt's neck pain:+TTP.  Negative SLR bilaterally.  No signs/sx/exam findings concerning for cauda equina syndrome.     Lymphadenopathy:      Cervical: No cervical adenopathy.   Skin:     General: Skin is warm and dry.      Capillary Refill: Capillary refill takes less  than 2 seconds.      Comments: Good skin turgor.    Neurological:      Mental Status: She is alert and oriented to person, place, and time.   Psychiatric:         Behavior: Behavior is cooperative.         Assessment:       Diagnosis Orders   1. Adrenal mass, left ([place])  VSS/well appearing.  Per gyn:p't to f/u regarding pelvis US results/bx(per pt').  Abdo pain resulting in ER visit is better.        4.8 cm fat containing lesion within the left adnexal space, most compatible  with an ovarian dermoid. ??      2. Essential hypertension  Stable.     3. Acute pain of both shoulders  No significant change in shoulder/neck/radicualr  sx.  Consult ortho/start PT.  Continue:Rest/NSAIDs/ice-heat pad.      [facility]   4. Neck pain  [facility] [place]    naproxen (NAPROSYN) 500 MG tablet    [facility]   5. Elevated glucose level  Check A1c:lab order provided.     6. Radicular pain of left upper extremity  [facility] [place]    naproxen (NAPROSYN) 500 MG tablet    [facility]   7. Other elevated white blood cell (WBC) count  Recheck lab.  CBC   8. Gastroesophageal reflux disease without esophagitis  Stable.     9. Influenza vaccination declined by patient             Plan:      Pt' left office in good condition.  Obtain labs/diagnostic tests as discussed today & call back for results within 2-7days.  Call or return to clinic prn if these symptoms worsen or fail to improve as anticipated.          [name], MD

## 2018-05-13 LAB — CBC
Hematocrit: 38.8 % (ref 36.0–48.0)
Hemoglobin: 13.1 g/dL (ref 12.0–16.0)
MCH: 28.9 pg (ref 26.0–34.0)
MCHC: 33.9 g/dL (ref 31.0–36.0)
MCV: 85.4 fL (ref 80.0–100.0)
MPV: 9.9 fL (ref 5.0–10.5)
Platelets: 285 10*3/uL (ref 135–450)
RBC: 4.54 M/uL (ref 4.00–5.20)
RDW: 13.3 % (ref 12.4–15.4)
WBC: 10.8 10*3/uL (ref 4.0–11.0)

## 2018-05-13 LAB — HEMOGLOBIN A1C
Estimated Avg Glucose: 145.6 mg/dL
Hemoglobin A1C: 6.7 %

## 2018-05-16 ENCOUNTER — Inpatient Hospital Stay: Admit: 2018-05-16 | Payer: MEDICAID

## 2018-05-16 DIAGNOSIS — Z1239 Encounter for other screening for malignant neoplasm of breast: Secondary | ICD-10-CM

## 2018-06-02 ENCOUNTER — Ambulatory Visit: Admit: 2018-06-02 | Discharge: 2018-06-02 | Payer: MEDICAID | Attending: Surgical

## 2018-06-02 DIAGNOSIS — M542 Cervicalgia: Secondary | ICD-10-CM

## 2018-06-02 MED ORDER — METHOCARBAMOL 750 MG PO TABS
750 MG | ORAL_TABLET | Freq: Three times a day (TID) | ORAL | 0 refills | Status: AC
Start: 2018-06-02 — End: 2018-07-02

## 2018-06-02 MED ORDER — NAPROXEN 500 MG PO TABS
500 MG | ORAL_TABLET | Freq: Two times a day (BID) | ORAL | 3 refills | Status: DC
Start: 2018-06-02 — End: 2018-08-18

## 2018-06-02 NOTE — Progress Notes (Signed)
Subjective:      Patient ID: Faith Mason is a 42 y.o.  female.    Chief Complaint   Patient presents with   ??? New Patient     Neck Pain       Patient does not speak English therefore a medical interpreter was utilized during the exam today.  I did spend 30 minutes in the office today with greater than 50% of this time counseling, reviewing diagnostic tests, face to face discussion concerning their diagnosis and treatment options. All of their questions were answered.    HPI:   She is here for an initial evaluation of cervical pain.  Onset of symptoms approximately 1 year ago.  She denies any history of injury..   These symptoms have been progressive in nature.   Pain is constant.   Pain description: dull, aching, throbbing, numbness, radiating to shoulder(s) bilaterally, arm(s) bilaterally.  Pain occurs: Constant.   Pain is aggravated with activity.  Pain severity: 6/10.  Pain improves with change in position.  Associated symptoms: None.  Previous treatments: Naprosyn and gabapentin with minimal relief.  She is scheduled to start physical therapy next week.    Review of Systems:   A 14 point review of systems and history form completed by the patient has been reviewed.  This form is scanned in the media tab of the patient's chart under today's date.    Past Medical History:   Diagnosis Date   ??? GERD (gastroesophageal reflux disease)    ??? Hypertension 2016       Family History   Problem Relation Age of Onset   ??? High Blood Pressure Mother    ??? Diabetes Mother    ??? High Blood Pressure Father    ??? Diabetes Father        Past Surgical History:   Procedure Laterality Date   ??? BACK SURGERY  2017    L-spine       Social History     Occupational History   ??? Not on file   Tobacco Use   ??? Smoking status: Never Smoker   ??? Smokeless tobacco: Never Used   Substance and Sexual Activity   ??? Alcohol use: Never     Frequency: Never   ??? Drug use: Never   ??? Sexual activity: Yes     Comment: married-4 children        Current  Outpatient Medications   Medication Sig Dispense Refill   ??? methocarbamol (ROBAXIN-750) 750 MG tablet Take 1 tablet by mouth 3 times daily 90 tablet 0   ??? naproxen (NAPROSYN) 500 MG tablet Take 1 tablet by mouth 2 times daily (with meals) 60 tablet 3   ??? lisinopril-hydrochlorothiazide (PRINZIDE;ZESTORETIC) 10-12.5 MG per tablet Take 1 tablet by mouth daily 30 tablet 2   ??? famotidine (PEPCID) 20 MG tablet Take 20 mg by mouth 2 times daily       No current facility-administered medications for this visit.          Objective:     She is alert, oriented x 3, pleasant, well nourished, developed and in no acute distress.    BP 113/73    Pulse 64    Temp 97.8 ??F (36.6 ??C) (Temporal)    Resp 16    Ht 4\' 11"  (1.499 m)    Wt 143 lb (64.9 kg)    BMI 28.88 kg/m??      Cervical Spine Exam:  There is not deformity.  There  is not loss of motion.  There is  muscular spasm.   There is  trapezius/ rhomboid tenderness.  There is not spinous process tenderness.  There is not cervical lymphadenopathy.  Spurling Test is Negative.    NEUROLOGICAL EXAM:  Examination of the upper and lower extremities are intact with sensation to light touch.   Motor testing is 5/5 in all major motor groups including hand intrinsics.   Normal heel to toe gait.  Hoffman's Sign absent.        Reflexes:  Biceps               Left 2+  Right 2+  Triceps              Left 2+  Right 2+  Brachioradialis  Left 2+  Right 2+    Examination of the left shoulder shows:  There is not a deformity.  There is not erythema.  There is not soft tissue swelling.   Deltoid region is  tender to palpation.  AC Joint is not tender to palpation.  Scapula/ trapezius is  tender to palpation.  There is not weakness with supraspinatus testing.  There is not pain with supraspinatus testing.   Supraspinatus Test negative.    Examination of the right shoulder shows:  There is not a deformity.  There is not erythema.  There is not soft tissue swelling.   Deltoid region is  tender to  palpation.  AC Joint is not tender to palpation.  Scapula/ trapezius is  tender to palpation.  There is not weakness with supraspinatus testing.  There is not pain with supraspinatus testing.   Supraspinatus Test negative.    Intact perfusion to both upper extremities.   No cyanosis, digits are warm to touch.   Capillary refill is less than 2 seconds.  No edema noted.     Skin is intact without lacerations, abrasions, significant erythema, rashes or skin lesions.       X Rays: not performed in the office today:   X Rays from [facility] or an outside facility:  Cervical spine 3 views    ??   INDICATIONS: Pain   ??   Reversal of curvature with mild kyphosis at the C3-4 interspace.   ??   Prevertebral soft tissues are normal.   Symmetric disc spaces.   Posterior elements are intact.   ??   No acute fracture or subluxation   Soft tissues are normal   ??   ??   Impression   ??   1. Reversal of curvature     Narrative   Left shoulder   ??   HISTORY: Acute shoulder pain   ??   ??   Impression   ??   3 views demonstrate no abnormality.     Narrative   Right shoulder   ??   HISTORY: Acute shoulder pain   ??   ??   Impression   ??   3 views demonstrate no abnormality.   ??         Assessment:       ICD-10-CM    1. Cervical muscle pain M54.2 Ambulatory referral to Physical Therapy        Plan:     Due to the use of a medical interpreter, I did spend 30 minutes in the office today with greater than 50% of this time counseling, reviewing diagnostic tests, face to face discussion concerning their diagnosis and treatment options. All of their questions  were answered.    Chronic cervical pain which I believe to be more muscular in nature.  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    Discussed the cervical pain, its course and treatment.  Discussed appropriate exercises.  Discussed appropriate use of ice and heat.  NSAIDs per medication orders.  Muscle relaxants added per medication orders.  PT  referral.      Follow Up: 6 weeks.  Call or return to clinic prn if these symptoms worsen or fail to improve as anticipated.

## 2018-06-05 ENCOUNTER — Encounter: Attending: Family Medicine

## 2018-06-05 NOTE — Care Coordination-Inpatient (Signed)
Physical Therapy  Cancellation/No-show Note  Patient Name:  Faith Mason  DOB:  04/19/1976   Date:  06/05/2018  MRN: [id number]  Cancelled visits to date: 1  Eval 06/05/18  No-shows to date: 0    For today's appointment patient:  [x]   Cancelled  []   Rescheduled appointment  []   No-show     Reason given by patient:  []   Patient ill  []   Conflicting appointment  []   No transportation    []   Conflict with work  []   No reason given  [x]   Other:     Comments:  No interpreter scheduled    Electronically signed by:  Ronnald Nianara Anjolie Majer, PT, DPT [phone]

## 2018-06-06 ENCOUNTER — Inpatient Hospital Stay: Payer: MEDICAID

## 2018-06-09 NOTE — Care Coordination-Inpatient (Signed)
Physical Therapy  Cancellation/No-show Note  Patient Name:  Faith Mason  DOB:  04/25/1976   Date:  06/09/2018  MRN: [id number]  Cancelled visits to date: 2  Eval 06/09/18  Eval 06/05/18  No-shows to date: 0    For today's appointment patient:  [x]   Cancelled  []   Rescheduled appointment  []   No-show     Reason given by patient:  []   Patient ill  []   Conflicting appointment  []   No transportation    []   Conflict with work  []   No reason given  [x]   Other:     Comments:  No interpreter able to be scheduled    Electronically signed by:  Jerrell Mylar, PT

## 2018-06-10 ENCOUNTER — Inpatient Hospital Stay: Payer: MEDICAID

## 2018-06-16 ENCOUNTER — Ambulatory Visit: Admit: 2018-06-16 | Discharge: 2018-06-16 | Payer: MEDICAID | Attending: Family Medicine

## 2018-06-16 ENCOUNTER — Encounter: Attending: Family Medicine

## 2018-06-16 DIAGNOSIS — E119 Type 2 diabetes mellitus without complications: Secondary | ICD-10-CM

## 2018-06-16 MED ORDER — LANCETS MISC
6 refills | Status: DC
Start: 2018-06-16 — End: 2019-08-30

## 2018-06-16 MED ORDER — METFORMIN HCL ER 500 MG PO TB24
500 MG | ORAL_TABLET | Freq: Every day | ORAL | 2 refills | Status: DC
Start: 2018-06-16 — End: 2018-06-17

## 2018-06-16 MED ORDER — BLOOD GLUCOSE TEST VI STRP
ORAL_STRIP | 4 refills | Status: DC
Start: 2018-06-16 — End: 2018-06-17

## 2018-06-16 MED ORDER — FREESTYLE SYSTEM KIT
PACK | Freq: Once | 4 refills | Status: DC
Start: 2018-06-16 — End: 2018-06-17

## 2018-06-16 NOTE — Progress Notes (Signed)
Subjective:      Patient ID: Faith Mason is a 42 y.o. female.    HPI  Interpreter present.    Results for MORGYN, MARUT (MRN [id number]) as of 06/16/2018 14:16   Ref. Range 05/12/2018 14:12   Hemoglobin A1C Latest Ref Range: See comment % 6.7   eAG (mg/dL) Latest Units: mg/dL 145.6   WBC Latest Ref Range: 4.0 - 11.0 K/uL 10.8   RBC Latest Ref Range: 4.00 - 5.20 M/uL 4.54   Hemoglobin Quant Latest Ref Range: 12.0 - 16.0 g/dL 13.1   Hematocrit Latest Ref Range: 36.0 - 48.0 % 38.8   MCV Latest Ref Range: 80.0 - 100.0 fL 85.4   MCH Latest Ref Range: 26.0 - 34.0 pg 28.9   MCHC Latest Ref Range: 31.0 - 36.0 g/dL 33.9   MPV Latest Ref Range: 5.0 - 10.5 fL 9.9   RDW Latest Ref Range: 12.4 - 15.4 % 13.3   Platelet Count Latest Ref Range: 135 - 450 K/uL 285     Abnormal glucose: new dx based on A1c of 6.7=diabetes.  Associated w/nothing.  Improving factor:none identified.  Worsening factor:none reported.  +fhx DM2(mother).  Denies polyuria/polyphagia/polydipsia/unexpected weight loss or gain.    HTN check:  Doing well.  Associated w/nothing.  Worsened by nothing reported.  Improves w/medication:no side effects.  Adds salt to food at the table:No does not.  Denies cp/sob/pnd/ankle edema/dizziness.     Takes pepcid for GERD:otc med. No side effects. Doing well.    F/u JY:NWGNFAOZH shoulder pain >63month described as mild sharp pain.  Seen ortho on 12/13:office note reviewed. PT advised & prescribed naproxen.  Now:no change since prior visit.  PT appt pending:states had appt cancelled du eto no interpreter:she will call to reschedule.  Preceding injury:none recalled.  Associated w/mild intermittent neck pain(posterior) & mild intermittent radicular pain bilateral arms.  Worsened(aggravated) by shoulder ROM & abduction. +night time shoulder pain.  Improves with naproxen:needs refill.  Denies BUE weakness-paralysis/skin wounds-ulcers-lesions.        No Known Allergies    Current Outpatient Medications on File Prior to Visit    Medication Sig Dispense Refill   ??? methocarbamol (ROBAXIN-750) 750 MG tablet Take 1 tablet by mouth 3 times daily 90 tablet 0   ??? naproxen (NAPROSYN) 500 MG tablet Take 1 tablet by mouth 2 times daily (with meals) 60 tablet 3   ??? lisinopril-hydrochlorothiazide (PRINZIDE;ZESTORETIC) 10-12.5 MG per tablet Take 1 tablet by mouth daily 30 tablet 2     No current facility-administered medications on file prior to visit.        Past Medical History:   Diagnosis Date   ??? GERD (gastroesophageal reflux disease)    ??? Hypertension 2016           Social History     Tobacco Use   ??? Smoking status: Never Smoker   ??? Smokeless tobacco: Never Used   Substance Use Topics   ??? Alcohol use: Never     Frequency: Never   ??? Drug use: Never     Social History     Substance and Sexual Activity   Drug Use Never           Review of Systems   Constitutional: Negative for activity change, appetite change, chills, diaphoresis, fatigue, fever and unexpected weight change.   Respiratory: Negative for apnea, cough, choking, chest tightness, shortness of breath, wheezing and stridor.    Cardiovascular: Negative for chest pain, palpitations and leg swelling.  Gastrointestinal: Negative for abdominal distention, abdominal pain, anal bleeding, blood in stool, constipation, diarrhea, nausea, rectal pain and vomiting.   Endocrine: Negative for cold intolerance, heat intolerance, polydipsia, polyphagia and polyuria.   Genitourinary: Negative for decreased urine volume, difficulty urinating, dyspareunia, dysuria, enuresis, flank pain, frequency, genital sores, hematuria, menstrual problem, pelvic pain, urgency, vaginal bleeding, vaginal discharge and vaginal pain.   Musculoskeletal: Positive for arthralgias. Negative for back pain and gait problem.   Neurological: Negative.  Negative for dizziness, light-headedness and headaches.   Hematological: Negative for adenopathy.       Objective:   Physical Exam  Constitutional:       General: She is not in acute  distress.     Appearance: Normal appearance. She is well-developed.   HENT:      Nose: Nose normal.      Mouth/Throat:      Pharynx: Uvula midline.   Eyes:      General: Lids are normal.      Conjunctiva/sclera: Conjunctivae normal.      Pupils: Pupils are equal, round, and reactive to light.   Neck:      Musculoskeletal: Normal range of motion and neck supple.      Vascular: No carotid bruit or JVD.      Trachea: Trachea normal.   Cardiovascular:      Rate and Rhythm: Normal rate and regular rhythm.      Pulses: Normal pulses.      Heart sounds: Normal heart sounds.   Pulmonary:      Effort: Pulmonary effort is normal.      Breath sounds: Normal breath sounds.   Abdominal:      General: Bowel sounds are normal. There is no distension.      Palpations: Abdomen is soft. There is no hepatomegaly, splenomegaly or mass.      Tenderness: There is no tenderness. There is no right CVA tenderness, left CVA tenderness, guarding or rebound.   Musculoskeletal:      Cervical back: She exhibits normal range of motion, no tenderness, no bony tenderness, no swelling, no edema, no deformity, no laceration, no pain, no spasm and normal pulse.        Back:         Arms:       Comments: X shows area of neck pain:No TTP.  Negative SLR bilaterally.  No change in [facility] exam since prior visit on 05/12/18.  No signs/sx/exam findings concerning for cauda equina syndrome.     Lymphadenopathy:      Cervical: No cervical adenopathy.   Skin:     General: Skin is warm and dry.      Capillary Refill: Capillary refill takes less than 2 seconds.      Comments: Good skin turgor.    Neurological:      Mental Status: She is alert and oriented to person, place, and time.   Psychiatric:         Behavior: Behavior is cooperative.         Assessment:        Diagnosis Orders   1. Controlled type 2 diabetes mellitus without complication, without long-term current use of insulin ([place])  VSS/well appearing.    metFORMIN (GLUCOPHAGE XR) 500 MG extended release  tablet:medication declined by pt' stating she will address with diet changes.  The patient is asked to make an attempt to improve diet and exercise patterns to aid in medical management of this problem.  A1c in 39mo.  glucose monitoring kit (FREESTYLE) monitoring kit    blood glucose monitor strips    Lancets MISC    Microalbumin / Creatinine Urine Ratio    Hemoglobin A1C   2. Essential hypertension  Stable.  Comprehensive Metabolic Panel   3. Acute pain of both shoulders  No change in neck-shoulder pain:start PT. Keep ortho f/u appts.  NSAIDS per ortho script.     4. Mixed hyperlipidemia  Not at goal.  The patient is asked to make an attempt to improve diet and exercise patterns to aid in medical management of this problem.  Med declined.  Labs in 35mo.  Comprehensive Metabolic Panel    Lipid Panel   5. Gastroesophageal reflux disease without esophagitis  Stable.     6. Neck pain  See note#3.     7. [place]  [place].     8. Radicular pain of left upper extremity  See note#3.     9. Other elevated white blood cell (WBC) count  Resolved.               Plan:        Obtain labs/diagnostic tests as discussed today & call back for results within 2-7days.  Call or return to clinic prn if these symptoms worsen or fail to improve as anticipated.      Pt' left office in good condition.      RHarden Mo MD

## 2018-06-16 NOTE — Patient Instructions (Signed)
Patient Education        Learning About Diabetes Food Guidelines  Your Care Instructions    Meal planning is important to manage diabetes. It helps keep your blood sugar at a target level (which you set with your doctor). You don't have to eat special foods. You can eat what your family eats, including sweets once in a while. But you do have to pay attention to how often you eat and how much you eat of certain foods.  You may want to work with a dietitian or a certified diabetes educator (CDE) to help you plan meals and snacks. A dietitian or CDE can also help you lose weight if that is one of your goals.  What should you know about eating carbs?  Managing the amount of carbohydrate (carbs) you eat is an important part of healthy meals when you have diabetes. Carbohydrate is found in many foods.  ?? Learn which foods have carbs. And learn the amounts of carbs in different foods.  ? Bread, cereal, pasta, and rice have about 15 grams of carbs in a serving. A serving is 1 slice of bread (1 ounce), ?? cup of cooked cereal, or 1/3 cup of cooked pasta or rice.  ? Fruits have 15 grams of carbs in a serving. A serving is 1 small fresh fruit, such as an apple or orange; ?? of a banana; ?? cup of cooked or canned fruit; ?? cup of fruit juice; 1 cup of melon or raspberries; or 2 tablespoons of dried fruit.  ? Milk and no-sugar-added yogurt have 15 grams of carbs in a serving. A serving is 1 cup of milk or 2/3 cup of no-sugar-added yogurt.  ? Starchy vegetables have 15 grams of carbs in a serving. A serving is ?? cup of mashed potatoes or sweet potato; 1 cup winter squash; ?? of a small baked potato; ?? cup of cooked beans; or ?? cup cooked corn or green peas.  ?? Learn how much carbs to eat each day and at each meal. A dietitian or CDE can teach you how to keep track of the amount of carbs you eat. This is called carbohydrate counting.  ?? If you are not sure how to count carbohydrate grams, use the Plate Method to plan meals. It is a  good, quick way to make sure that you have a balanced meal. It also helps you spread carbs throughout the day.  ? Divide your plate by types of foods. Put non-starchy vegetables on half the plate, meat or other protein food on one-quarter of the plate, and a grain or starchy vegetable in the final quarter of the plate. To this you can add a small piece of fruit and 1 cup of milk or yogurt, depending on how many carbs you are supposed to eat at a meal.  ?? Try to eat about the same amount of carbs at each meal. Do not "save up" your daily allowance of carbs to eat at one meal.  ?? Proteins have very little or no carbs per serving. Examples of proteins are beef, chicken, turkey, fish, eggs, tofu, cheese, cottage cheese, and peanut butter. A serving size of meat is 3 ounces, which is about the size of a deck of cards. Examples of meat substitute serving sizes (equal to 1 ounce of meat) are 1/4 cup of cottage cheese, 1 egg, 1 tablespoon of peanut butter, and ?? cup of tofu.  How can you eat out and still eat healthy?  ??   Learn to estimate the serving sizes of foods that have carbohydrate. If you measure food at home, it will be easier to estimate the amount in a serving of restaurant food.  ?? If the meal you order has too much carbohydrate (such as potatoes, corn, or baked beans), ask to have a low-carbohydrate food instead. Ask for a salad or green vegetables.  ?? If you use insulin, check your blood sugar before and after eating out to help you plan how much to eat in the future.  ?? If you eat more carbohydrate at a meal than you had planned, take a walk or do other exercise. This will help lower your blood sugar.  What else should you know?  ?? Limit saturated fat, such as the fat from meat and dairy products. This is a healthy choice because people who have diabetes are at higher risk of heart disease. So choose lean cuts of meat and nonfat or low-fat dairy products. Use olive or canola oil instead of butter or shortening  when cooking.  ?? Don't skip meals. Your blood sugar may drop too low if you skip meals and take insulin or certain medicines for diabetes.  ?? Check with your doctor before you drink alcohol. Alcohol can cause your blood sugar to drop too low. Alcohol can also cause a bad reaction if you take certain diabetes medicines.  Follow-up care is a key part of your treatment and safety. Be sure to make and go to all appointments, and call your doctor if you are having problems. It's also a good idea to know your test results and keep a list of the medicines you take.  Where can you learn more?  Go to [email or web address] and sign in to your MyChart account. Enter I147 in the Search Health Information box to learn more about "Learning About Diabetes Food Guidelines."     If you do not have an account, please click on the "Sign Up Now" link.  Current as of: January 12, 2017  Content Version: 12.1  ?? 2006-2019 [employer]. Care instructions adapted under license by [facility]. If you have questions about a medical condition or this instruction, always ask your healthcare professional. [employer], [employer] disclaims any warranty or liability for your use of this information.         Patient Education        Learning About Meal Planning for Diabetes  Why plan your meals?    Meal planning can be a key part of managing diabetes. Planning meals and snacks with the right balance of carbohydrate, protein, and fat can help you keep your blood sugar at the target level you set with your doctor.  You don't have to eat special foods. You can eat what your family eats, including sweets once in a while. But you do have to pay attention to how often you eat and how much you eat of certain foods.  You may want to work with a dietitian or a certified diabetes educator. He or she can give you tips and meal ideas and can answer your questions about meal planning. This health professional can also help you reach  a healthy weight if that is one of your goals.  What plan is right for you?  Your dietitian or diabetes educator may suggest that you start with the plate format or carbohydrate counting.  The plate format  The plate format is a simple way to help you manage how you eat. You plan meals   by learning how much space each food should take on a plate. Using the plate format helps you spread carbohydrate throughout the day. It can make it easier to keep your blood sugar level within your target range. It also helps you see if you're eating healthy portion sizes.  To use the plate format, you put non-starchy vegetables on half your plate. Add meat or meat substitutes on one-quarter of the plate. Put a grain or starchy vegetable (such as brown rice or a potato) on the final quarter of the plate. You can add a small piece of fruit and some low-fat or fat-free milk or yogurt, depending on your carbohydrate goal for each meal.  Here are some tips for using the plate format:  ?? Make sure that you are not using an oversized plate. A 9-inch plate is best. Many restaurants use larger plates.  ?? Get used to using the plate format at home. Then you can use it when you eat out.  ?? Write down your questions about using the plate format. Talk to your doctor, a dietitian, or a diabetes educator about your concerns.  Carbohydrate counting  With carbohydrate counting, you plan meals based on the amount of carbohydrate in each food. Carbohydrate raises blood sugar higher and more quickly than any other nutrient. It is found in desserts, breads and cereals, and fruit. It's also found in starchy vegetables such as potatoes and corn, grains such as rice and pasta, and milk and yogurt. Spreading carbohydrate throughout the day helps keep your blood sugar levels within your target range.  Your daily amount depends on several things, including your weight, how active you are, which diabetes medicines you take, and what your goals are for your  blood sugar levels. A registered dietitian or diabetes educator can help you plan how much carbohydrate to include in each meal and snack.  A guideline for your daily amount of carbohydrate is:  ?? 45 to 60 grams at each meal. That's about the same as 3 to 4 carbohydrate servings.  ?? 15 to 20 grams at each snack. That's about the same as 1 carbohydrate serving.  The Nutrition Facts label on packaged foods tells you how much carbohydrate is in a serving of the food. First, look at the serving size on the food label. Is that the amount you eat in a serving? All of the nutrition information on a food label is based on that serving size. So if you eat more or less than that, you'll need to adjust the other numbers. Total carbohydrate is the next thing you need to look for on the label. If you count carbohydrate servings, one serving of carbohydrate is 15 grams.  For foods that don't come with labels, such as fresh fruits and vegetables, you'll need a guide that lists carbohydrate in these foods. Ask your doctor, dietitian, or diabetes educator about books or other nutrition guides you can use.  If you take insulin, you need to know how many grams of carbohydrate are in a meal. This lets you know how much rapid-acting insulin to take before you eat. If you use an insulin pump, you get a constant rate of insulin during the day. So the pump must be programmed at meals to give you extra insulin to cover the rise in blood sugar after meals.  When you know how much carbohydrate you will eat, you can take the right amount of insulin. Or, if you always use the same amount of insulin,   you need to make sure that you eat the same amount of carbohydrate at meals.  If you need more help to understand carbohydrate counting and food labels, ask your doctor, dietitian, or diabetes educator.  How do you get started with meal planning?  Here are some tips to get started:  ?? Plan your meals a week at a time. Don't forget to include snacks  too.  ?? Use cookbooks or online recipes to plan several main meals. Plan some quick meals for busy nights. You also can double some recipes that freeze well. Then you can save half for other busy nights when you don't have time to cook.  ?? Make sure you have the ingredients you need for your recipes. If you're running low on basic items, put these items on your shopping list too.  ?? List foods that you use to make breakfasts, lunches, and snacks. List plenty of fruits and vegetables.  ?? Post this list on the refrigerator. Add to it as you think of more things you need.  ?? Take the list to the store to do your weekly shopping.  Follow-up care is a key part of your treatment and safety. Be sure to make and go to all appointments, and call your doctor if you are having problems. It's also a good idea to know your test results and keep a list of the medicines you take.  Where can you learn more?  Go to [email or web address] and sign in to your MyChart account. Enter X936 in the Search Health Information box to learn more about "Learning About Meal Planning for Diabetes."     If you do not have an account, please click on the "Sign Up Now" link.  Current as of: January 12, 2017  Content Version: 12.1  ?? 2006-2019 [employer]. Care instructions adapted under license by [facility]. If you have questions about a medical condition or this instruction, always ask your healthcare professional. [employer] disclaims any warranty or liability for your use of this information.

## 2018-06-17 ENCOUNTER — Encounter

## 2018-06-17 MED ORDER — BLOOD GLUCOSE TEST VI STRP
ORAL_STRIP | 4 refills | Status: DC
Start: 2018-06-17 — End: 2019-10-03

## 2018-06-17 MED ORDER — FREESTYLE SYSTEM KIT
PACK | Freq: Once | 0 refills | Status: AC
Start: 2018-06-17 — End: 2024-05-23

## 2018-07-05 ENCOUNTER — Ambulatory Visit: Admit: 2018-07-05 | Discharge: 2018-07-05 | Payer: MEDICAID | Attending: Surgical

## 2018-07-05 DIAGNOSIS — M542 Cervicalgia: Secondary | ICD-10-CM

## 2018-07-05 NOTE — Progress Notes (Signed)
Subjective:      Patient ID: Faith Mason is a 43 y.o.  female.    Chief Complaint   Patient presents with   ??? Follow-up     Neck pain       Patient does not speak English therefore a medical interpreter was utilized for today's visit.  I did spend 20 minutes in the office today with greater than 50% of this time counseling, reviewing diagnostic tests, face to face discussion concerning their diagnosis and treatment options. All of their questions were answered.      HPI: She is here for follow-up on her cervical radicular pain.  She continues have moderate to significant discomfort 7/10.  She has pain in the posterior lateral cervical region that radiates down into both trapezius muscles.  She also has pain posterior lateral arm into her hand.  Intermittent paresthesias noted in both hands.  She denies any perceived weakness at this time.  She is having difficulty getting comfortable in any 1 position.  She is losing sleep over her neck pain.  Oral medications and effective in treating her symptoms.  Attempted physical therapy but finding it difficult to get scheduled with a medical interpreter.  Physical therapy appointments continually get canceled due to the lack of interpreter.    Review of Systems:   Negative for fever or chills.      Past Medical History:   Diagnosis Date   ??? GERD (gastroesophageal reflux disease)    ??? Hypertension 2016       Family History   Problem Relation Age of Onset   ??? High Blood Pressure Mother    ??? Diabetes Mother    ??? High Blood Pressure Father    ??? Diabetes Father        Past Surgical History:   Procedure Laterality Date   ??? BACK SURGERY  2017    L-spine       Social History     Occupational History   ??? Not on file   Tobacco Use   ??? Smoking status: Never Smoker   ??? Smokeless tobacco: Never Used   Substance and Sexual Activity   ??? Alcohol use: Never     Frequency: Never   ??? Drug use: Never   ??? Sexual activity: Yes     Comment: married-4 children        Current Outpatient Medications    Medication Sig Dispense Refill   ??? blood glucose monitor strips Test 2times a day 100 strip 4   ??? glucose monitoring kit (FREESTYLE) monitoring kit 1 each by Does not apply route once for 1 dose Glucometer:QD-BID testing. Ok to provide insurance covered device. 1 kit 0   ??? Lancets MISC BID testing 100 each 6   ??? naproxen (NAPROSYN) 500 MG tablet Take 1 tablet by mouth 2 times daily (with meals) 60 tablet 3   ??? lisinopril-hydrochlorothiazide (PRINZIDE;ZESTORETIC) 10-12.5 MG per tablet Take 1 tablet by mouth daily 30 tablet 2     No current facility-administered medications for this visit.          Objective:   She is alert, oriented x 3, pleasant, well nourished, developed and in no acute distress.    BP 110/70    Pulse 62    Temp 98 ??F (36.7 ??C) (Temporal)    Resp 16    Ht '4\' 11"'  (1.499 m)    Wt 144 lb 4.8 oz (65.5 kg)    BMI 29.15 kg/m??  Cervical Spine Exam:  There is not deformity.  There is  loss of motion.  There is  muscular spasm.   There is  trapezius/ rhomboid tenderness.  There is not spinous process tenderness.  There is not cervical lymphadenopathy.  Spurling Test is Positive.    Examination of the upper extremities are intact with sensation to light touch.  Motor testing  5/5 in all major motor groups including hand intrinsics.   Radial, Median and Ulnar nerves are intact.  Hoffman's Sign absent.   Decreased sensation to light touch noted in both left and right hand.    Examination of the upper extremities shows intact perfusion to all extremities.  No cyanosis.  Digits are warm to touch.  Capillary refill is less than 2 seconds.   There is no edema noted.     Examination of the skin over the upper extremities:  Reveals the skin to be intact without lacerations or abrasions.   There are no significant erythema, rashes or skin lesions.           X Rays: not performed in the office today:     Diagnosis:        ICD-10-CM    1. Cervical muscle pain M54.2    2. Cervical radicular pain M54.12    3.  Paresthesia and pain of both upper extremities R20.2     M79.601     M79.602         Assessment/ Plan:      I did spend 20 minutes in the office today with greater than 50% of this time counseling, reviewing diagnostic tests, face to face discussion concerning their diagnosis and treatment options. All of their questions were answered.    She has complaints consistent with cervical radicular pain with paresthesias in both left and right upper extremity.  She attempted oral medications with no relief.  She attempted to schedule physical therapy but having difficulty due to the need of a medical language interpreter.      The natural history of the patient's diagnosis as well as the treatment options were discussed in full and questions were answered. Risks and benefits of the treatment options also reviewed in detail.     MRI cervical spine and EMG of both left and right upper extremity.    Follow Up: After MRI and EMG has been obtained.  Call or return to clinic prn if these symptoms worsen or fail to improve as anticipated.

## 2018-07-12 ENCOUNTER — Telehealth

## 2018-07-12 NOTE — Telephone Encounter (Signed)
Patients daughter has called and states that the therapist keeps rescheduling because translator keeps cancelling they would like to know who else they can see.  Please advise  Marchelle Gearing [phone]

## 2018-07-12 NOTE — Telephone Encounter (Signed)
Advise pt' to request assistance from the PT dept regarding scheduling an interpreter.

## 2018-07-12 NOTE — Telephone Encounter (Signed)
Per Dr. Harl Favor ok to refer to Faith Mason PT.  Referral placed   Pt's daughter informed.  On HIPAA  Close Encounter

## 2018-07-24 ENCOUNTER — Inpatient Hospital Stay: Admit: 2018-07-24 | Payer: MEDICAID

## 2018-07-24 DIAGNOSIS — M542 Cervicalgia: Secondary | ICD-10-CM

## 2018-07-24 NOTE — Plan of Care (Signed)
[facility], [place]  [address]    [place], [place] [address]  Phone: [phone]   Fax:     [phone]                                                       Physical Therapy Certification    Dear Referring Practitioner: Eveline Keto, MD,    We had the pleasure of evaluating the following patient for physical therapy services at [facility].  A summary of our findings can be found in the initial assessment below.  This includes our plan of care.  If you have any questions or concerns regarding these findings, please do not hesitate to contact me at the office phone number checked above.  Thank you for the referral.       Physician Signature:_______________________________Date:__________________  By signing above (or electronic signature), therapist's plan is approved by physician      Patient: Faith Mason   DOB: May 15, 1976   MRN: [id number]  Referring Physician: Referring Practitioner: Eveline Keto, MD      Evaluation Date: 07/24/2018      Medical Diagnosis Information:  Diagnosis: Neck pain M54.2,  B shoudler pain M25.511 w B UE radiculopathy                                             Insurance information:      Precautions/ Contra-indications: None  Latex Allergy:  [x] NO      [] YES  Preferred Language for Healthcare:   [x] English       [] other:    SUBJECTIVE: Patient stated complaint: Pt reports ongoing pain B shoulders,  At times R or Left.  Mostly in lateral upper arm,  Numbness in hands,  L hand along  Ulnar border 5th digit.  She has had low back sx in 2017, Sleep is distrubed nightly,  Difficulty using hands for normal household chores such as cutting food, stirring pots, occasionally dropping objects when doing dishes.  She reports daily headaches as welll.   She reports is to have MRI after completes PT.  Pain ranges from 5 - 10/10.      Relevant Medical History:Lumbar sx 2017  Functional Outcome:  Quick dash  Score 41  68%  disability    Pain Scale: 8/10  Easing factors: ice to shoulder,  Lying down on back relieves neck/shoulder pain,   Provocative factors: looking up, using hands for normal activities, sitting     Type: [] Constant   [x] Intermittent  [] Radiating [] Localized [] other:     Numbness/Tingling: B hands/fingers    Occupation/School: homemaker     Living Status/Prior Level of Function: Prior to this injury / incident, pt was independent with ADLs and IADLs, , lives with family and performs normal household chores, cooking, cleaning,     OBJECTIVE:     Palpation: TTP : R UT,  Suboccipital MM, cervical PSMM,  L shoulder lateral brachium,.    Sensation equal B B U E    Functional Mobility/Transfers: Normal    Posture: good    Bandages/Dressings/Incisions: NA    Gait: (include devices/WB status): WNL     Dermatomes Normal Abnormal Comments  Top of head (C1) xx     Posterior occipital region (C2) x     Side of neck (C3) x     Top of shoulder (C4) x     Lateral deltoid (C5) x     Tip of thumb (C6) x     Distal middle finger (C7) x     Distal fifth finger (C8) x     Medial forearm (T1) xx     Lower extremity          Reflexes Normal Abnormal Comments   C5-6 Biceps x     C5-6 Brachioradialis xx     C7-8 Triceps x     Hoffman's      S1-2 Seated achilles      S1-2 Prone knee bend      L3-4 Patellar tendon      Clonus      Babinski          CERV ROM     Cervical Flexion WNL    Cervical Extension WNL    Cervical SB WNL    Cervical rotation WNL         ROM Left Right   Shoulder Flex ALLwn ALL wnl   Shoulder Abd     Shoulder ER     Shoulder IR               Strength / Myotomes Left Right   Cervical Flexion (C1-2)     Cervical Side-bending (C3)     Shoulder Shrug (C4) 4+ 4+   Shoulder Flex 4+ 4+   Shoulder Scap 4+ 4+   Shoulder Abduction (C5) 4+ 4+   Shoulder ER 4+ 4+   Shoulder IR 4+ 4+   Biceps (C6) 4+ 4+   Triceps (C7)     Wrist Extension (C6)     Wrist Flexion (C7)     Grip      Thumb Abduction (C8)     Finger Abduction (T1)        Lower extremity myotomes:   [x] Normal     [] Abnormal     Joint mobility:    [x] Normal    [] Hypo   [] Hyper    Orthopaedic Special Tests  Positive  Negative  NT Comments    Hautard's        Rhomberg       Sharps-Purser       Cervical Torsion / Body Rotation        C2 Kick  x     Modified Shear       Compression  x     Distraction   x              [x]  Patient history, allergies, meds reviewed. Medical chart reviewed. See intake form.     Review Of Systems (ROS):  [x] Performed Review of systems (Integumentary, CardioPulmonary, Neurological) by intake and observation. Intake form has been scanned into medical record. Patient has been instructed to contact their primary care physician regarding ROS issues if not already being addressed at this time.      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)   Cardiovascular conditions   [x] Hypertension (I10)  [] Hyperlipidemia (E78.5)  [] Angina pectoris (I20)  [] Atherosclerosis (I70)   Musculoskeletal conditions   [x] Disc pathology   [] Congenital spine pathologies   [x] Prior surgical intervention  [] Osteoporosis (M81.8)  []   Osteopenia (M85.8)   Endocrine conditions   [] Hypothyroid (E03.9)  [] Hyperthyroid Gastrointestinal conditions   [] Constipation (K59.00)   Metabolic conditions   [] Morbid obesity (E66.01)  [] Diabetes type 1(E10.65) or 2 (E11.65)   [] Neuropathy (G60.9)     Pulmonary conditions   [] Asthma (J45)  [] Coughing   [] COPD (J44.9)   Psychological Disorders  [] Anxiety (F41.9)  [] Depression (F32.9)   [] Other:   [] Other:          Barriers to/and or personal factors that will affect rehab potential:              [] Age  [] Sex   [] Smoker              [] Motivation/Lack of Motivation                        [] Co-Morbidities              [] Cognitive Function, education/learning barriers              [] Environmental, home barriers               [] profession/work barriers  [] past PT/medical experience  [x] other:  Justification:     Falls Risk Assessment (30 days): none  [x]  Falls Risk assessed and no intervention required.  []  Falls Risk assessed and Patient requires intervention due to being higher risk   TUG score (>12s at risk):     []  Falls education provided, including         ASSESSMENT: Pt presents with s/s in B UE consistent with cervical pathology,  Pain B lateral brachium,  normal strength,  DTR, dermatome and myotome.  Numbness/tinging in hands/fingers.     Functional Impairments:     [] Noted cervical/thoracic/GHJ joint hypomobility   [] Noted cervical/thoracic/GHJ joint hypermobility   [] Decreased cervical/UE functional ROM   [x] Noted Headache pain aggravated by neck movements with/without dizziness   [] Abnormal reflexes/sensation/myotomal/dermatomal deficits   [] Decreased DCF control or ability to hold head up   [] Decreased RC/scapular/core strength and neuromuscular control    [] Decreased UE functional strength   [] other:      Functional Activity Limitations (from functional questionnaire and intake)   [] Reduced ability to tolerate prolonged functional positions   [] Reduced ability or difficulty with changes of positions or transfers between positions   [] Reduced ability to maintain good posture and demonstrate good body mechanics with sitting, bending, and lifting   []  Reduced ability or tolerance with driving and/or computer work   [x] Reduced ability to perform lifting, reaching, carrying tasks   [] Reduced ability to concentrate   [x] Reduced ability to sleep    [] Reduced ability to tolerate any impact through UE or spine   [] Reduced ability to ambulate prolonged functional periods/distances   [] other:    Participation Restrictions   [x] Reduced participation in self care activities   [x] Reduced participation in home management activities   [] Reduced participation in work activities   [] Reduced participation in social activities.   [] Reduced  participation in sport/recreational activities.    Classification/Subgrouping:   [] signs/symptoms consistent with neck pain with mobility deficits     [] signs/symptoms consistent with neck pain with movement coordinated impairments    [x] signs/symptoms consistent with neck pain with radiating pain    [] signs/symptoms consistent with neck pain with headaches (cervicogenic)    [] Signs/symptoms consistent with nerve root involvement including myotome & dermatome dysfunction   [] sign/symptoms consistent with facet dysfunction of cervical and thoracic spine    [] signs/symptoms consistent suggesting central  cord compression/UMN syndromes   [x] signs/symptoms consistent with discogenic cervical pain   [] signs/symptoms consistent with rib dysfunction   [] signs/symptoms consistent with postural dysfunction   [] signs/symptoms consistent with shoulder pathology    [] signs/symptoms consistent with post-surgical status including decreased ROM, strength and function.   [] signs/symptoms consistent with pathology which may benefit from Dry Needling   [] signs/symptoms which may limit the use of advanced manual therapy techniques: (Hypertension, recent trauma, intolerance to end range positions, prior TIA, visual issues, UE myotomes loss )     Prognosis/Rehab Potential:      [] Excellent   [x] Good    [] Fair   [] Poor    Tolerance of evaluation/treatment:    [] Excellent   [x] Good    [] Fair   [] Poor    Physical Therapy Evaluation Complexity Justification  [x]  A history of present problem with:  []  no personal factors and/or comorbidities that impact the plan of care;  [x] 1-2 personal factors and/or comorbidities that impact the plan of care  [] 3 personal factors and/or comorbidities that impact the plan of care  [x]  An examination of body systems using standardized tests and measures addressing any of the following: body structures and functions (impairments), activity limitations, and/or participation restrictions;:  [x]  a total of 1-2  or more elements   []  a total of 3 or more elements   []  a total of 4 or more elements   [x]  A clinical presentation with:  []  stable and/or uncomplicated characteristics   [x]  evolving clinical presentation with changing characteristics  []  unstable and unpredictable characteristics;   [x]  Clinical decision making of [x]  low, []  moderate, []  high complexity using standardized patient assessment instrument and/or measurable assessment of functional outcome.    [x]  EVAL (LOW) 97161 (typically 20 minutes face-to-face)  []  EVAL (MOD) 72536 (typically 30 minutes face-to-face)  []  EVAL (HIGH) 97163 (typically 45 minutes face-to-face)  []  RE-EVAL     PLAN:   Frequency/Duration:  3 days per week for 4 Weeks:  Interventions:  [x]   Therapeutic exercise including: strength training, ROM, for cervical spine,scapula, core and Upper extremity, including postural re-education.   [x]   NMR activation and proprioception for Deep cervical flexors, periscapular and RC muscles and Core, including postural re-education.    [x]   Manual therapy as indicated for C/T spine, ribs, Soft tissue to include: Dry Needling/IASTM, STM, PROM, Gr I-IV mobilizations, manipulation.   [x]  Modalities as needed that may include: thermal agents, E-stim, Biofeedback, [place], iontophoresis as indicated  [x]  Patient education on joint protection, postural re-education, activity modification, progression of HEP.     HEP instruction: Written HEP instructions provided and reviewed (see scanned image in Careers information officer).   Pt provided UT and Levator scap stretches,  Chin tuck and Upper cervical flex/ext exercises.      GOALS:  Patient stated goal: resolve pain to return to normal   []  Progressing: []  Met: []  Not Met: []  Adjusted    Therapist goals for Patient:   Short Term Goals: To be achieved in: 2 weeks  1. Independent in HEP and progression per patient tolerance, in order to prevent re-injury.   []  Progressing: []  Met: []  Not Met: []  Adjusted  2. Patient will have  a decrease in pain to facilitate improvement in movement, function, and ADLs as indicated by Functional Deficits.  []  Progressing: []  Met: []  Not Met: []  Adjusted    Long Term Goals: To be achieved in: 4 weeks  1. Disability index score of 20% or less for the  NDI to assist with reaching prior level of function.   []  Progressing: []  Met: []  Not Met: []  Adjusted  2. Patient will demonstrate increased AROM to [facility] of cervical/thoracic spine to allow for proper joint functioning as indicated by patients Functional Deficits.   []  Progressing: []  Met: []  Not Met: []  Adjusted  3. Patient will demonstrate an increase in postural awareness and control and activation of  Deep cervical stabilizers to allow for proper functional mobility as indicated by patients Functional Deficits.   []  Progressing: []  Met: []  Not Met: []  Adjusted  4. Patient will return to functional activities including cooking, cleaning without increased symptoms or restriction.   []  Progressing: []  Met: []  Not Met: []  Adjusted  5. To be able to sleep through night without waking due to pain  []  Progressing: []  Met: []  Not Met: []  Adjusted     Electronically signed by:  Biagio Borg, PT [phone]

## 2018-07-24 NOTE — Other (Signed)
[facility]  Phone: [phone]   Fax: [phone]    Physical Therapy Treatment Note/ Progress Report:     Date:  07/24/2018    Patient Name:  Faith Mason    DOB:  April 13, 1976  MRN: [id number]  Restrictions/Precautions:    Medical/Treatment Diagnosis Information:  ?? Diagnosis: Neck pain M54.2,  B shoudler pain M25.511 w B UE radiculopathy  ??    Insurance/Certification information:     Physician Information:  Referring Practitioner: [name], MD  Plan of care signed (Y/N): '[]'   Yes '[]'   No       Date of Patient follow up with Physician:  Pt states is to complete PT then MRI  MD appt 2/20     Progress Report: '[x]'   Yes  '[]'   No     Date Range for reporting period:   Beginning: 07/24/2018  Ending:     Progress report due (10 Rx/or 30 days whichever is less): 10 days     Recertification due (POC duration/ or 90 days whichever is less): n;     Visit # Insurance Allowable Auth required? Date Range   1/12 30 v/cy [facility] '[]'   Yes  '[x]'   No      Latex Allergy:  '[x]' NO      '[]' YES  Preferred Language for Healthcare:   '[]' English       '[x]' other: Nepalese  Functional Scale: NDI: Quick Dash  Score 41,  61% disability Date assessed:07/24/2018    Pain level:  7-8/10 L shoulder at present  SUBJECTIVE:  See eval    OBJECTIVE: See eval  ??? Observation:   ??? Test measurements:      RESTRICTIONS/PRECAUTIONS:     Exercises/Interventions:   Therapeutic Exercise (97110) Resistance / level Sets/sec Reps Notes   UBE       Chin tuck w/ball behind head       CTwith rotation B  CT with flex/ext                                                 Therapeutic Activities (17793)                                                 NMR re-education (90300)                                          Manual Intervention (97140)       Cerv mobs/manip              Gentle cervcial distraction       MFR/DTM UT and Levator SCap                         Patient education:  -pt educated on diagnosis, prognosis and  expectations for rehab  -pt provided with written and illustrated instructions for HEP  -all pt questions were answered    Therapeutic Exercise and NMR EXR  '[x]'  (97110) Provided verbal/tactile cueing for activities related to strengthening, flexibility, endurance, ROM  for improvements in cervical,  postural, scapular, scapulothoracic and UE control with self care, reaching, carrying, lifting, house/yardwork, driving/computer work.    '[x]'  [phone]) Provided verbal/tactile cueing for activities related to improving balance, coordination, kinesthetic sense, posture, motor skill, proprioception  to assist with cervical, scapular, scapulothoracic and UE control with self care, reaching, carrying, lifting, house/yardwork, driving/computer work.    Therapeutic Activities:    '[]'  [phone] or 54650) Provided verbal/tactile cueing for activities related to improving balance, coordination, kinesthetic sense, posture, motor skill, proprioception and motor activation to allow for proper function of cervical, scapular, scapulothoracic and UE control with self care, carrying, lifting, driving/computer work.     Home Exercise Program:    '[x]'  [phone]) Reviewed/Progressed HEP activities related to strengthening, flexibility, endurance, ROM of cervical, scapular, scapulothoracic and UE control with self care, reaching, carrying, lifting, house/yardwork, driving/computer work  '[]'  [phone]) Reviewed/Progressed HEP activities related to improving balance, coordination, kinesthetic sense, posture, motor skill, proprioception of cervical, scapular, scapulothoracic and UE control with self care, reaching, carrying, lifting, house/yardwork, driving/computer work      Manual Treatments:  PROM / STM / Oscillations-Mobs:  G-I, II, III, IV (PA's, Inf., Post.)  '[]'  (97140) Provided manual therapy to mobilize soft tissue/joints of cervical/CT, scapular GHJ and UE for the purpose of decreasing headache, modulating pain, promoting relaxation,  increasing  ROM, reducing/eliminating soft tissue swelling/inflammation/restriction, improving soft tissue extensibility and allowing for proper ROM for normal function with self care, reaching, carrying, lifting, house/yardwork, driving/computer work    Modalities:    Trial cervical traction.   Charges:  Timed Code Treatment Minutes: 20   Total Treatment Minutes: 50       '[x]'  EVAL - LOW (97161)   '[]'  EVAL - MOD (51700)  '[]'  EVAL - HIGH (97163)  '[]'  RE-EVAL (17494)  '[x]'  WH(67591) x  1    '[]'  NMR (63846) x      '[]'  Ionto  '[]'  Manual (97140) x      '[]'  Ultrasound  '[]'  TA x       '[]'  Mech Traction (65993)  '[]'  Home Management Training x   '[]'  ES (un) (57017):           '[]'  Aquatic    '[]'  ES(attended) (79390)   '[]'  Group    '[]'  Other:    GOALS:Patient stated goal: resolve pain to return to normal   '[]'  Progressing: '[]'  Met: '[]'  Not Met: '[]'  Adjusted    Therapist goals for Patient:   Short Term Goals: To be achieved in: 2 weeks  1. Independent in HEP and progression per patient tolerance, in order to prevent re-injury.   '[]'  Progressing: '[]'  Met: '[]'  Not Met: '[]'  Adjusted  2. Patient will have a decrease in pain to facilitate improvement in movement, function, and ADLs as indicated by Functional Deficits.  '[]'  Progressing: '[]'  Met: '[]'  Not Met: '[]'  Adjusted    Long Term Goals: To be achieved in: 4 weeks  1. Disability index score of 20% or less for the NDI to assist with reaching prior level of function.   '[]'  Progressing: '[]'  Met: '[]'  Not Met: '[]'  Adjusted  2. Patient will demonstrate increased AROM to [facility] of cervical/thoracic spine to allow for proper joint functioning as indicated by patients Functional Deficits.   '[]'  Progressing: '[]'  Met: '[]'  Not Met: '[]'  Adjusted  3. Patient will demonstrate an increase in postural awareness and control and activation of  Deep cervical stabilizers to allow for proper functional mobility as indicated by patients Functional Deficits.   '[]'   Progressing: '[]'  Met: '[]'  Not Met: '[]'  Adjusted  4. Patient will return to functional  activities including cooking, cleaning without increased symptoms or restriction.   '[]'  Progressing: '[]'  Met: '[]'  Not Met: '[]'  Adjusted  5. To be able to sleep through night without waking due to pain  '[]'  Progressing: '[]'  Met: '[]'  Not Met: '[]'  Adjusted         Overall Progression Towards Functional goals/ Treatment Progress Update:  '[]'  Patient is progressing as expected towards functional goals listed.    '[]'  Progression is slowed due to complexities/Impairments listed.  '[]'  Progression has been slowed due to co-morbidities.  '[x]'  Plan just implemented, too soon to assess goals progression <30days   '[]'  Goals require adjustment due to lack of progress  '[]'  Patient is not progressing as expected and requires additional follow up with physician  '[]'  Other    Persisting Functional Limitations/Impairments:  '[]' Sitting '[]' Standing   '[]' Walking '[]' Squatting/bending    '[]' Stairs '[x]' ADL's    '[]' Transfers '[x]' Reaching  '[x]' Housework '[x]' Lifting  '[]' Driving '[]' Job related tasks  '[]' Sports/Recreation  '[x]' Sleeping  '[]' Other:    ASSESSMENT:  See eval  Treatment/Activity Tolerance:  '[x]'  Patient able to complete tx  '[]'  Patient limited by fatique  '[]'  Patient limited by pain  '[]'  Patient limited by other medical complications  '[]'  Other:     Prognosis: '[x]'  Good '[]'  Fair  '[]'  Poor    Patient Requires Follow-up: '[x]'  Yes  '[]'  No    PLAN: See eval. PT 2x / week for 4 weeks.   '[]'  Continue per plan of care '[]'  Alter current plan (see comments)  '[x]'  Plan of care initiated '[]'  Hold pending MD visit '[]'  Discharge    Electronically signed by: Marybelle Killings, PT       Note: If patient does not return for scheduled/ recommended follow up visits, his note will serve as a discharge from care along with most recent update on progress.

## 2018-07-28 ENCOUNTER — Inpatient Hospital Stay: Admit: 2018-07-28 | Payer: MEDICAID

## 2018-07-28 NOTE — Other (Signed)
[facility]  Phone: [phone]   Fax: [phone]    Physical Therapy Treatment Note/ Progress Report:     Date:  07/28/2018    Patient Name:  Faith Mason    DOB:  Sep 09, 1975  MRN: [id number]  Restrictions/Precautions:    Medical/Treatment Diagnosis Information:  ?? Diagnosis: Neck pain M54.2,  B shoudler pain M25.511 w B UE radiculopathy  ??   Insurance/Certification information:     Physician Information:  Referring Practitioner: [name], MD  Plan of care signed (Y/N): '[x]'   Yes '[]'   No Cosign 2/3    Date of Patient follow up with Physician:  Pt states is to complete PT then MRI  MD appt 2/20     Progress Report: '[]'   Yes  '[x]'   No     Date Range for reporting period:   Beginning: 07/24/2018  Ending:     Progress report due (10 Rx/or 30 days whichever is less): 10 days     Recertification due (POC duration/ or 90 days whichever is less): n;     Visit # Insurance Allowable Auth required? Date Range   2/12 30 v/cy [facility] '[]'   Yes  '[x]'   No      Latex Allergy:  '[x]' NO      '[]' YES  Preferred Language for Healthcare:   '[]' English       '[x]' other: Nepalese  Functional Scale: NDI: Quick Dash  Score 41,  61% disability Date assessed:07/24/2018    Pain level: 5/10 L neck/shoulder   SUBJECTIVE:  Pt's daughter translating for pt. Pt with 5/10 L neck/sho pain today. Pt reports feeling better than she did on eval.  OBJECTIVE: See eval  ??? Observation:   ??? Test measurements:      RESTRICTIONS/PRECAUTIONS:     Exercises/Interventions:   Therapeutic Exercise (97110) Resistance / level Sets/sec Reps Notes   UBE  2 min=56F/1B     Chin tuck w/ball behind head   10x    CTwith rotation B  CT with flex/ext   10x  10x    tband row, lpd, high row                                          Therapeutic Activities (97530)                                                 NMR re-education (00370)                                          Manual Intervention (97140)       Cerv mobs/manip               Gentle cervcial distraction       MFR/DTM UT and Levator SCap       2/7: supine with roll under knees gentle STM L> R UT, scalenes, suboccipitals, gentle manual cervical txn, gentle manual cervical sb/rot/flex stetches  15 min                Patient education:  -pt educated on diagnosis, prognosis and expectations for rehab  -  pt provided with written and illustrated instructions for HEP  -all pt questions were answered    Therapeutic Exercise and NMR EXR  '[x]'  [phone]) Provided verbal/tactile cueing for activities related to strengthening, flexibility, endurance, ROM  for improvements in cervical, postural, scapular, scapulothoracic and UE control with self care, reaching, carrying, lifting, house/yardwork, driving/computer work.    '[x]'  [phone]) Provided verbal/tactile cueing for activities related to improving balance, coordination, kinesthetic sense, posture, motor skill, proprioception  to assist with cervical, scapular, scapulothoracic and UE control with self care, reaching, carrying, lifting, house/yardwork, driving/computer work.    Therapeutic Activities:    '[]'  [phone] or 10626) Provided verbal/tactile cueing for activities related to improving balance, coordination, kinesthetic sense, posture, motor skill, proprioception and motor activation to allow for proper function of cervical, scapular, scapulothoracic and UE control with self care, carrying, lifting, driving/computer work.     Home Exercise Program:    '[x]'  [phone]) Reviewed/Progressed HEP activities related to strengthening, flexibility, endurance, ROM of cervical, scapular, scapulothoracic and UE control with self care, reaching, carrying, lifting, house/yardwork, driving/computer work  '[]'  (62703) Reviewed/Progressed HEP activities related to improving balance, coordination, kinesthetic sense, posture, motor skill, proprioception of cervical, scapular, scapulothoracic and UE control with self care, reaching, carrying, lifting, house/yardwork,  driving/computer work      Manual Treatments:  PROM / STM / Oscillations-Mobs:  G-I, II, III, IV (PA's, Inf., Post.)  '[x]'  (97140) Provided manual therapy to mobilize soft tissue/joints of cervical/CT, scapular GHJ and UE for the purpose of decreasing headache, modulating pain, promoting relaxation,  increasing ROM, reducing/eliminating soft tissue swelling/inflammation/restriction, improving soft tissue extensibility and allowing for proper ROM for normal function with self care, reaching, carrying, lifting, house/yardwork, driving/computer work    Modalities:    Trial cervical traction.     2/7: supine with roll under knees, CP neck/L UT 10 min,covered with warm blanket    Charges:  Timed Code Treatment Minutes: 28   Total Treatment Minutes: 38       '[]'  EVAL - LOW (97161)   '[]'  EVAL - MOD (50093)  '[]'  EVAL - HIGH (81829)  '[]'  RE-EVAL (93716)  '[x]'  RC(78938) x  1    '[]'  NMR (11/08/1973) x      '[]'  Ionto  '[x]'  Manual (97140) x 1     '[]'  Ultrasound  '[]'  TA x       '[]'  Mech Traction (10258)  '[]'  Home Management Training x   '[]'  ES (un) (97014):           '[]'  Aquatic    '[]'  ES(attended) (52778)   '[]'  Group    '[]'  Other:    GOALS:Patient stated goal: resolve pain to return to normal   '[]'  Progressing: '[]'  Met: '[]'  Not Met: '[]'  Adjusted    Therapist goals for Patient:   Short Term Goals: To be achieved in: 2 weeks  1. Independent in HEP and progression per patient tolerance, in order to prevent re-injury.   '[]'  Progressing: '[]'  Met: '[]'  Not Met: '[]'  Adjusted  2. Patient will have a decrease in pain to facilitate improvement in movement, function, and ADLs as indicated by Functional Deficits.  '[]'  Progressing: '[]'  Met: '[]'  Not Met: '[]'  Adjusted    Long Term Goals: To be achieved in: 4 weeks  1. Disability index score of 20% or less for the NDI to assist with reaching prior level of function.   '[]'  Progressing: '[]'  Met: '[]'  Not Met: '[]'  Adjusted  2. Patient will demonstrate increased AROM to  WFL of cervical/thoracic spine to allow for proper joint functioning  as indicated by patients Functional Deficits.   '[]'  Progressing: '[]'  Met: '[]'  Not Met: '[]'  Adjusted  3. Patient will demonstrate an increase in postural awareness and control and activation of  Deep cervical stabilizers to allow for proper functional mobility as indicated by patients Functional Deficits.   '[]'  Progressing: '[]'  Met: '[]'  Not Met: '[]'  Adjusted  4. Patient will return to functional activities including cooking, cleaning without increased symptoms or restriction.   '[]'  Progressing: '[]'  Met: '[]'  Not Met: '[]'  Adjusted  5. To be able to sleep through night without waking due to pain  '[]'  Progressing: '[]'  Met: '[]'  Not Met: '[]'  Adjusted         Overall Progression Towards Functional goals/ Treatment Progress Update:  '[]'  Patient is progressing as expected towards functional goals listed.    '[]'  Progression is slowed due to complexities/Impairments listed.  '[]'  Progression has been slowed due to co-morbidities.  '[x]'  Plan just implemented, too soon to assess goals progression <30days   '[]'  Goals require adjustment due to lack of progress  '[]'  Patient is not progressing as expected and requires additional follow up with physician  '[]'  Other    Persisting Functional Limitations/Impairments:  '[]' Sitting '[]' Standing   '[]' Walking '[]' Squatting/bending    '[]' Stairs '[x]' ADL's    '[]' Transfers '[x]' Reaching  '[x]' Housework '[x]' Lifting  '[]' Driving '[]' Job related tasks  '[]' Sports/Recreation  '[x]' Sleeping  '[]' Other:    ASSESSMENT:  See eval  Treatment/Activity Tolerance:  '[x]'  Patient able to complete tx  '[]'  Patient limited by fatique  '[]'  Patient limited by pain  '[]'  Patient limited by other medical complications  '[]'  Other:     Prognosis: '[x]'  Good '[]'  Fair  '[]'  Poor    Patient Requires Follow-up: '[x]'  Yes  '[]'  No    PLAN: See eval. PT 2x / week for 4 weeks.   '[]'  Continue per plan of care '[]'  Alter current plan (see comments)  '[x]'  Plan of care initiated '[]'  Hold pending MD visit '[]'  Discharge    Electronically signed by: Joeseph Amor, PT , DPT [phone]      Note: If patient  does not return for scheduled/ recommended follow up visits, his note will serve as a discharge from care along with most recent update on progress.

## 2018-07-29 ENCOUNTER — Inpatient Hospital Stay: Admit: 2018-07-29 | Payer: MEDICAID

## 2018-07-29 NOTE — Other (Signed)
[facility]  Phone: [phone]   Fax: [phone]    Physical Therapy Treatment Note/ Progress Report:     Date:  07/29/2018    Patient Name:  Faith Mason    DOB:  Apr 22, 1976  MRN: [id number]  Restrictions/Precautions:    Medical/Treatment Diagnosis Information:   Diagnosis: Neck pain M54.2,  B shoudler pain M25.511 w B UE radiculopathy     Insurance/Certification information:     Physician Information:  Referring Practitioner: Eveline Keto, MD  Plan of care signed (Y/N): [x]   Yes []   No Cosign 2/3    Date of Patient follow up with Physician:  Pt states is to complete PT then MRI  MD appt 2/20     Progress Report: []   Yes  [x]   No     Date Range for reporting period:   Beginning: 07/24/2018  Ending:     Progress report due (10 Rx/or 30 days whichever is less): 10 days     Recertification due (POC duration/ or 90 days whichever is less): n;     Visit # Insurance Allowable Auth required? Date Range   2/12 30 v/cy [facility] []   Yes  [x]   No      Latex Allergy:  [x] NO      [] YES  Preferred Language for Healthcare:   [] English       [x] other: Nepalese  Functional Scale: NDI: Quick Dash  Score 41,  61% disability Date assessed:07/24/2018    Pain level: 6-7/10 L neck/shoulder     SUBJECTIVE:  Pt's daughter translating for pt.  She does have some numbness and burning in a small area above the right shoulder. Pt also report Pt's daughter states it seems like the pt is feeling better.     . OBJECTIVE:    . Observation:   . Test measurements:      RESTRICTIONS/PRECAUTIONS: Pt's daughter reports she had lower back in 2017    Exercises/Interventions:   Therapeutic Exercise (97110) Resistance / level Sets/sec Reps Notes   UBE  4 min=58F/2B     Chin tuck w/ball behind head      CTwith rotation B  CT with flex/ext      Mid row [place] 1 10    High row Lime green 1 10                                Therapeutic Activities (97530)                                                  NMR re-education (66063)                                          Manual Intervention (97140)       Cerv mobs/manip              Gentle cervcial distraction       MFR/DTM UT and Levator SCap       2/7: supine with roll under knees gentle STM L> R UT, scalenes, suboccipitals, gentle manual cervical txn, gentle manual cervical sb/rot/flex stetches       2/8:  STM B/L UT with trigger point release; manual cervical rotation and side bending stretching  9 min                               Patient education:  -pt educated on diagnosis, prognosis and expectations for rehab  -pt provided with written and illustrated instructions for HEP  -all pt questions were answered  2/8: Used spinal model to explain to pt and daughter radiculopathy and what could be causes of numbness in L hand     Therapeutic Exercise and NMR EXR  [x]  (97110) Provided verbal/tactile cueing for activities related to strengthening, flexibility, endurance, ROM  for improvements in cervical, postural, scapular, scapulothoracic and UE control with self care, reaching, carrying, lifting, house/yardwork, driving/computer work.    []  [phone]) Provided verbal/tactile cueing for activities related to improving balance, coordination, kinesthetic sense, posture, motor skill, proprioception  to assist with cervical, scapular, scapulothoracic and UE control with self care, reaching, carrying, lifting, house/yardwork, driving/computer work.    Therapeutic Activities:    []  [phone] or 72536) Provided verbal/tactile cueing for activities related to improving balance, coordination, kinesthetic sense, posture, motor skill, proprioception and motor activation to allow for proper function of cervical, scapular, scapulothoracic and UE control with self care, carrying, lifting, driving/computer work.     Home Exercise Program:    []  [phone]) Reviewed/Progressed HEP activities related to strengthening, flexibility, endurance, ROM of cervical, scapular, scapulothoracic and UE  control with self care, reaching, carrying, lifting, house/yardwork, driving/computer work  []  [phone]) Reviewed/Progressed HEP activities related to improving balance, coordination, kinesthetic sense, posture, motor skill, proprioception of cervical, scapular, scapulothoracic and UE control with self care, reaching, carrying, lifting, house/yardwork, driving/computer work      Manual Treatments:  PROM / STM / Oscillations-Mobs:  G-I, II, III, IV (PA's, Inf., Post.)  [x]  (97140) Provided manual therapy to mobilize soft tissue/joints of cervical/CT, scapular GHJ and UE for the purpose of decreasing headache, modulating pain, promoting relaxation,  increasing ROM, reducing/eliminating soft tissue swelling/inflammation/restriction, improving soft tissue extensibility and allowing for proper ROM for normal function with self care, reaching, carrying, lifting, house/yardwork, driving/computer work    Modalities:      07/29/18 Patient was positioned supine on traction table with bolsters under bilateral knees with head/neck in traction device. Parameters were as follows: 12/8 max/min pounds with on/off ratio of 30/10, for a total of 12 minutes.Patient was given panic button as well as instructed how to use it if experiencing pain. Patient was also given bell to call if anything is needed.     2/7: supine with roll under knees, CP neck/L UT 10 min,covered with warm blanket    Charges:  Timed Code Treatment Minutes: 28   Total Treatment Minutes: 40       []  EVAL - LOW (97161)   []  EVAL - MOD (95638)  []  EVAL - HIGH (97163)  []  RE-EVAL (97164)  [x]  VF(64332) x  1    []  NMR (97112) x      []  Ionto  [x]  Manual (97140) x 1     []  Ultrasound  []  TA x       [x]  Mech Traction (95188)  []  Home Management Training x   []  ES (un) (97014):           []  Aquatic    []  ES(attended) (41660)   []  Group    []  Other:    GOALS:Patient stated goal:  resolve pain to return to normal   []  Progressing: []  Met: []  Not Met: []  Adjusted    Therapist  goals for Patient:   Short Term Goals: To be achieved in: 2 weeks  1. Independent in HEP and progression per patient tolerance, in order to prevent re-injury.   []  Progressing: []  Met: []  Not Met: []  Adjusted  2. Patient will have a decrease in pain to facilitate improvement in movement, function, and ADLs as indicated by Functional Deficits.  []  Progressing: []  Met: []  Not Met: []  Adjusted    Long Term Goals: To be achieved in: 4 weeks  1. Disability index score of 20% or less for the NDI to assist with reaching prior level of function.   []  Progressing: []  Met: []  Not Met: []  Adjusted  2. Patient will demonstrate increased AROM to [facility] of cervical/thoracic spine to allow for proper joint functioning as indicated by patients Functional Deficits.   []  Progressing: []  Met: []  Not Met: []  Adjusted  3. Patient will demonstrate an increase in postural awareness and control and activation of  Deep cervical stabilizers to allow for proper functional mobility as indicated by patients Functional Deficits.   []  Progressing: []  Met: []  Not Met: []  Adjusted  4. Patient will return to functional activities including cooking, cleaning without increased symptoms or restriction.   []  Progressing: []  Met: []  Not Met: []  Adjusted  5. To be able to sleep through night without waking due to pain  []  Progressing: []  Met: []  Not Met: []  Adjusted         Overall Progression Towards Functional goals/ Treatment Progress Update:  []  Patient is progressing as expected towards functional goals listed.    []  Progression is slowed due to complexities/Impairments listed.  []  Progression has been slowed due to co-morbidities.  [x]  Plan just implemented, too soon to assess goals progression <30days   []  Goals require adjustment due to lack of progress  []  Patient is not progressing as expected and requires additional follow up with physician  []  Other    Persisting Functional  Limitations/Impairments:  [] Sitting [] Standing   [] Walking [] Squatting/bending    [] Stairs [x] ADL's    [] Transfers [x] Reaching  [x] Housework [x] Lifting  [] Driving [] Job related tasks  [] Sports/Recreation  [x] Sleeping  [] Other:    ASSESSMENT:  Pt had relief immediately following mechanical traction. Will continue with this as long as patient has relief of pain and radiating symptoms.   Treatment/Activity Tolerance:  [x]  Patient able to complete tx  []  Patient limited by fatique  []  Patient limited by pain  []  Patient limited by other medical complications  []  Other:     Prognosis: [x]  Good []  Fair  []  Poor    Patient Requires Follow-up: [x]  Yes  []  No    PLAN: See eval. PT 2x / week for 4 weeks.   [x]  Continue per plan of care []  Alter current plan (see comments)  []  Plan of care initiated []  Hold pending MD visit []  Discharge    Electronically signed by: Billee Cashing, PT , DPT       Note: If patient does not return for scheduled/ recommended follow up visits, his note will serve as a discharge from care along with most recent update on progress.

## 2018-07-31 ENCOUNTER — Inpatient Hospital Stay: Admit: 2018-07-31 | Payer: MEDICAID

## 2018-07-31 NOTE — Other (Signed)
[facility]  Phone: [phone]   Fax: [phone]    Physical Therapy Treatment Note/ Progress Report:     Date:  07/31/2018    Patient Name:  Faith Mason    DOB:  26-Oct-1975  MRN: [id number]  Restrictions/Precautions:    Medical/Treatment Diagnosis Information:  ?? Diagnosis: Neck pain M54.2,  B shoudler pain M25.511 w B UE radiculopathy  ?? Treatment diagnosis:  Increased pain and decreased scapular stability  Insurance/Certification information:     Physician Information:  Referring Practitioner: [name], MD  Plan of care signed (Y/N): '[x]'   Yes '[]'   No Cosign 2/3    Date of Patient follow up with Physician:  Pt states is to complete PT then MRI  MD appt 2/20     Progress Report: '[]'   Yes  '[x]'   No     Date Range for reporting period:   Beginning: 07/24/2018  Ending:     Progress report due (10 Rx/or 30 days whichever is less): 10 days     Recertification due (POC duration/ or 90 days whichever is less): n;     Visit # Insurance Allowable Auth required? Date Range   2/12 30 v/cy [facility] '[]'   Yes  '[x]'   No      Latex Allergy:  '[x]' NO      '[]' YES  Preferred Language for Healthcare:   '[]' English       '[x]' other: Nepalese  Functional Scale: NDI: Quick Dash  Score 41,  61% disability Date assessed:07/24/2018    Pain level: 4-5/10 L neck/shoulder     SUBJECTIVE:  Pt's daughter translating for pt.  She does have some numbness and burning in a small area above the right shoulder,but it is less than it used to be. Pt states she felt much better after the mechanical traction last visit.Pt also report Pt's daughter states it seems like the pt is feeling better.     ??? OBJECTIVE:    ??? Observation:   ??? Test measurements:      RESTRICTIONS/PRECAUTIONS: Pt's daughter reports she had lower back in 2017    Exercises/Interventions:   Therapeutic Exercise (97110) Resistance / level Sets/sec Reps Notes   UBE  4 min=63F/2B     Chin tuck w/towel behind head   10x    CTwith rotation B  CT  with flex/ext      Mid row Lime green 1 10    High row Lime green 1 10                                Therapeutic Activities (97530)                                                 NMR re-education (50932)                                          Manual Intervention (97140)       Cerv mobs/manip              Gentle cervcial distraction       MFR/DTM UT and Levator SCap       2/7:  supine with roll under knees gentle STM L> R UT, scalenes, suboccipitals, gentle manual cervical txn, gentle manual cervical sb/rot/flex stetches       2/8: STM B/L UT with trigger point release; manual cervical rotation and side bending stretching  10 min                               Patient education:  -pt educated on diagnosis, prognosis and expectations for rehab  -pt provided with written and illustrated instructions for HEP  -all pt questions were answered  2/8: Used spinal model to explain to pt and daughter radiculopathy and what could be causes of numbness in L hand     Therapeutic Exercise and NMR EXR  '[x]'  (97110) Provided verbal/tactile cueing for activities related to strengthening, flexibility, endurance, ROM  for improvements in cervical, postural, scapular, scapulothoracic and UE control with self care, reaching, carrying, lifting, house/yardwork, driving/computer work.    '[]'  [phone]) Provided verbal/tactile cueing for activities related to improving balance, coordination, kinesthetic sense, posture, motor skill, proprioception  to assist with cervical, scapular, scapulothoracic and UE control with self care, reaching, carrying, lifting, house/yardwork, driving/computer work.    Therapeutic Activities:    '[]'  [phone] or 86381) Provided verbal/tactile cueing for activities related to improving balance, coordination, kinesthetic sense, posture, motor skill, proprioception and motor activation to allow for proper function of cervical, scapular, scapulothoracic and UE control with self care, carrying, lifting, driving/computer  work.     Home Exercise Program:    '[]'  [phone]) Reviewed/Progressed HEP activities related to strengthening, flexibility, endurance, ROM of cervical, scapular, scapulothoracic and UE control with self care, reaching, carrying, lifting, house/yardwork, driving/computer work  '[]'  [phone]) Reviewed/Progressed HEP activities related to improving balance, coordination, kinesthetic sense, posture, motor skill, proprioception of cervical, scapular, scapulothoracic and UE control with self care, reaching, carrying, lifting, house/yardwork, driving/computer work      Manual Treatments:  PROM / STM / Oscillations-Mobs:  G-I, II, III, IV (PA's, Inf., Post.)  '[x]'  (97140) Provided manual therapy to mobilize soft tissue/joints of cervical/CT, scapular GHJ and UE for the purpose of decreasing headache, modulating pain, promoting relaxation,  increasing ROM, reducing/eliminating soft tissue swelling/inflammation/restriction, improving soft tissue extensibility and allowing for proper ROM for normal function with self care, reaching, carrying, lifting, house/yardwork, driving/computer work    Modalities:      07/29/18 Patient was positioned supine on traction table with bolsters under bilateral knees with head/neck in traction device. Parameters were as follows: 12/8 max/min pounds with on/off ratio of 30/10, for a total of 12 minutes.Patient was given panic button as well as instructed how to use it if experiencing pain. Patient was also given bell to call if anything is needed.     2/7: supine with roll under knees, CP neck/L UT 10 min,covered with warm blanket    Charges:  Timed Code Treatment Minutes: 26   Total Treatment Minutes: 38       '[]'  EVAL - LOW (97161)   '[]'  EVAL - MOD (83338)  '[]'  EVAL - HIGH (32919)  '[]'  RE-EVAL (16606)  '[x]'  YO(45997) x  1    '[]'  NMR (74142) x      '[]'  Ionto  '[x]'  Manual (97140) x 1     '[]'  Ultrasound  '[]'  TA x       '[x]'  Mech Traction (39532)  '[]'  Home Management Training x   '[]'  ES (un) (97014):           '[]'   Aquatic    '[]'  ES(attended) [phone])   '[]'  Group    '[]'  Other:    GOALS:Patient stated goal: resolve pain to return to normal   '[]'  Progressing: '[]'  Met: '[]'  Not Met: '[]'  Adjusted    Therapist goals for Patient:   Short Term Goals: To be achieved in: 2 weeks  1. Independent in HEP and progression per patient tolerance, in order to prevent re-injury.   '[]'  Progressing: '[]'  Met: '[]'  Not Met: '[]'  Adjusted  2. Patient will have a decrease in pain to facilitate improvement in movement, function, and ADLs as indicated by Functional Deficits.  '[]'  Progressing: '[]'  Met: '[]'  Not Met: '[]'  Adjusted    Long Term Goals: To be achieved in: 4 weeks  1. Disability index score of 20% or less for the NDI to assist with reaching prior level of function.   '[]'  Progressing: '[]'  Met: '[]'  Not Met: '[]'  Adjusted  2. Patient will demonstrate increased AROM to [facility] of cervical/thoracic spine to allow for proper joint functioning as indicated by patients Functional Deficits.   '[]'  Progressing: '[]'  Met: '[]'  Not Met: '[]'  Adjusted  3. Patient will demonstrate an increase in postural awareness and control and activation of  Deep cervical stabilizers to allow for proper functional mobility as indicated by patients Functional Deficits.   '[]'  Progressing: '[]'  Met: '[]'  Not Met: '[]'  Adjusted  4. Patient will return to functional activities including cooking, cleaning without increased symptoms or restriction.   '[]'  Progressing: '[]'  Met: '[]'  Not Met: '[]'  Adjusted  5. To be able to sleep through night without waking due to pain  '[]'  Progressing: '[]'  Met: '[]'  Not Met: '[]'  Adjusted         Overall Progression Towards Functional goals/ Treatment Progress Update:  '[]'  Patient is progressing as expected towards functional goals listed.    '[]'  Progression is slowed due to complexities/Impairments listed.  '[]'  Progression has been slowed due to co-morbidities.  '[x]'  Plan just implemented, too soon to assess goals progression <30days   '[]'  Goals require adjustment due to lack of progress  '[]'  Patient is  not progressing as expected and requires additional follow up with physician  '[]'  Other    Persisting Functional Limitations/Impairments:  '[]' Sitting '[]' Standing   '[]' Walking '[]' Squatting/bending    '[]' Stairs '[x]' ADL's    '[]' Transfers '[x]' Reaching  '[x]' Housework '[x]' Lifting  '[]' Driving '[]' Job related tasks  '[]' Sports/Recreation  '[x]' Sleeping  '[]' Other:    ASSESSMENT:  Pt continues to demonstrate improvement in pain and functional mobility, but remains with decreased scapular strength and stability.  Pt will continue to benefit from skilled PT services to address remaining deficits and return to PLOF.  Treatment/Activity Tolerance:  '[x]'  Patient able to complete tx  '[]'  Patient limited by fatique  '[]'  Patient limited by pain  '[]'  Patient limited by other medical complications  '[]'  Other:     Prognosis: '[x]'  Good '[]'  Fair  '[]'  Poor    Patient Requires Follow-up: '[x]'  Yes  '[]'  No    PLAN: See eval. PT 2x / week for 4 weeks.   '[x]'  Continue per plan of care '[]'  Alter current plan (see comments)  '[]'  Plan of care initiated '[]'  Hold pending MD visit '[]'  Discharge    Electronically signed by: Adline Potter, PT , DPT  [phone]      Note: If patient does not return for scheduled/ recommended follow up visits, his note will serve as a discharge from care along with most recent update on progress.

## 2018-08-01 ENCOUNTER — Inpatient Hospital Stay: Admit: 2018-08-01 | Payer: MEDICAID

## 2018-08-01 NOTE — Other (Signed)
[facility]  Phone: [phone]   Fax: [phone]    Physical Therapy Treatment Note/ Progress Report:     Date:  08/01/2018    Patient Name:  Faith Mason    DOB:  1975/10/14  MRN: [id number]  Restrictions/Precautions:    Medical/Treatment Diagnosis Information:   Diagnosis: Neck pain M54.2,  B shoudler pain M25.511 w B UE radiculopathy   Treatment diagnosis:  Increased pain and decreased scapular stability  Insurance/Certification information:     Physician Information:  Referring Practitioner: Eveline Keto, MD  Plan of care signed (Y/N): [x]   Yes []   No Cosign 2/3    Date of Patient follow up with Physician:  Pt states is to complete PT then MRI  MD appt 2/20     Progress Report: []   Yes  [x]   No     Date Range for reporting period:   Beginning: 07/24/2018  Ending:     Progress report due (10 Rx/or 30 days whichever is less): 10 days     Recertification due (POC duration/ or 90 days whichever is less): n;     Visit # Insurance Allowable Auth required? Date Range   5/12 30 v/cy [facility] []   Yes  [x]   No      Latex Allergy:  [x] NO      [] YES  Preferred Language for Healthcare:   [] English       [x] other: Nepalese  Functional Scale: NDI: Quick Dash  Score 41,  61% disability Date assessed:07/24/2018    Pain level: 4-5/10 L neck/shoulder     SUBJECTIVE:  Pt's daughter translating for pt.  She does have some numbness and burning in a small area above the right shoulder,but it is less than it was. Pt states she felt much better after the mechanical traction. Pt also report Pt's daughter states it seems like the pt is feeling better.     . OBJECTIVE:    . Observation:   . Test measurements:      RESTRICTIONS/PRECAUTIONS: Pt's daughter reports she had lower back in 2017    Exercises/Interventions:   Therapeutic Exercise (97110) Resistance / level Sets/sec Reps Notes   UBE  4 min=76F/2B     Chin tuck w/towel behind head   10x    CTwith rotation B  CT with flex/ext       Mid row Lime green 1 15    High row Lime green 1 15    LPD   15    Per pt request pulley sho flex   10x                  Therapeutic Activities (97530)                                                 NMR re-education (77824)                                          Manual Intervention (97140)       Cerv mobs/manip              Gentle cervcial distraction       MFR/DTM UT and Levator SCap  2/11, 2/7: supine with roll under knees gentle STM L> R UT, scalenes, suboccipitals, gentle manual cervical txn, gentle manual cervical sb/rot/flex stetches  10 min     2/8: STM B/L UT with trigger point release; manual cervical rotation and side bending stretching                              Patient education: 2/11: handout  Lime tband 1x/day, 15x high row, lpd, mid row  -pt educated on diagnosis, prognosis and expectations for rehab  -pt provided with written and illustrated instructions for HEP  -all pt questions were answered  2/8: Used spinal model to explain to pt and daughter radiculopathy and what could be causes of numbness in L hand     Therapeutic Exercise and NMR EXR  [x]  (97110) Provided verbal/tactile cueing for activities related to strengthening, flexibility, endurance, ROM  for improvements in cervical, postural, scapular, scapulothoracic and UE control with self care, reaching, carrying, lifting, house/yardwork, driving/computer work.    []  [phone]) Provided verbal/tactile cueing for activities related to improving balance, coordination, kinesthetic sense, posture, motor skill, proprioception  to assist with cervical, scapular, scapulothoracic and UE control with self care, reaching, carrying, lifting, house/yardwork, driving/computer work.    Therapeutic Activities:    []  [phone] or 51884) Provided verbal/tactile cueing for activities related to improving balance, coordination, kinesthetic sense, posture, motor skill, proprioception and motor activation to allow for proper function of cervical, scapular,  scapulothoracic and UE control with self care, carrying, lifting, driving/computer work.     Home Exercise Program:   [x]  [phone]) Reviewed/Progressed HEP activities related to strengthening, flexibility, endurance, ROM of cervical, scapular, scapulothoracic and UE control with self care, reaching, carrying, lifting, house/yardwork, driving/computer work  []  [phone]) Reviewed/Progressed HEP activities related to improving balance, coordination, kinesthetic sense, posture, motor skill, proprioception of cervical, scapular, scapulothoracic and UE control with self care, reaching, carrying, lifting, house/yardwork, driving/computer work      Manual Treatments:  PROM / STM / Oscillations-Mobs:  G-I, II, III, IV (PA's, Inf., Post.)  [x]  (97140) Provided manual therapy to mobilize soft tissue/joints of cervical/CT, scapular GHJ and UE for the purpose of decreasing headache, modulating pain, promoting relaxation,  increasing ROM, reducing/eliminating soft tissue swelling/inflammation/restriction, improving soft tissue extensibility and allowing for proper ROM for normal function with self care, reaching, carrying, lifting, house/yardwork, driving/computer work    Modalities:    2/11:  Pt said PT could increase pull. Patient was positioned supine on traction table with bolsters under bilateral knees with head/neck in traction device. Parameters were as follows: 14/10 max/min pounds with on/off ratio of 30/10, for a total of 12 minutes.Patient was given panic button as well as instructed how to use it if experiencing pain. Patient was also given bell to call if anything is needed.     07/29/18 Patient was positioned supine on traction table with bolsters under bilateral knees with head/neck in traction device. Parameters were as follows: 12/8 max/min pounds with on/off ratio of 30/10, for a total of 12 minutes.Patient was given panic button as well as instructed how to use it if experiencing pain. Patient was also given bell to  call if anything is needed.     2/7: supine with roll under knees, CP neck/L UT 10 min,covered with warm blanket    Charges:  Timed Code Treatment Minutes: 28   Total Treatment Minutes: 40       []  EVAL -  LOW (97161)   []  EVAL - MOD (84132)  []  EVAL - HIGH (97163)  []  RE-EVAL (44010)  [x]  UV(25366) x  1    []  NMR (97112) x      []  Ionto  [x]  Manual (97140) x 1     []  Ultrasound  []  TA x       [x]  Mech Traction (44034)  []  Home Management Training x   []  ES (un) (74259):           []  Aquatic    []  ES(attended) (56387)   []  Group    []  Other:    GOALS:Patient stated goal: resolve pain to return to normal   [x]  Progressing: []  Met: []  Not Met: []  Adjusted    Therapist goals for Patient:   Short Term Goals: To be achieved in: 2 weeks  1. Independent in HEP and progression per patient tolerance, in order to prevent re-injury.   [x]  Progressing: []  Met: []  Not Met: []  Adjusted  2. Patient will have a decrease in pain to facilitate improvement in movement, function, and ADLs as indicated by Functional Deficits.  [x]  Progressing: []  Met: []  Not Met: []  Adjusted    Long Term Goals: To be achieved in: 4 weeks  1. Disability index score of 20% or less for the NDI to assist with reaching prior level of function.   []  Progressing: []  Met: []  Not Met: []  Adjusted  2. Patient will demonstrate increased AROM to [facility] of cervical/thoracic spine to allow for proper joint functioning as indicated by patients Functional Deficits.   []  Progressing: []  Met: []  Not Met: []  Adjusted  3. Patient will demonstrate an increase in postural awareness and control and activation of  Deep cervical stabilizers to allow for proper functional mobility as indicated by patients Functional Deficits.   []  Progressing: []  Met: []  Not Met: []  Adjusted  4. Patient will return to functional activities including cooking, cleaning without increased symptoms or restriction.   []  Progressing: []  Met: []  Not Met: []  Adjusted  5. To be able to sleep through night  without waking due to pain  []  Progressing: []  Met: []  Not Met: []  Adjusted         Overall Progression Towards Functional goals/ Treatment Progress Update:  []  Patient is progressing as expected towards functional goals listed.    []  Progression is slowed due to complexities/Impairments listed.  []  Progression has been slowed due to co-morbidities.  [x]  Plan just implemented, too soon to assess goals progression <30days   []  Goals require adjustment due to lack of progress  []  Patient is not progressing as expected and requires additional follow up with physician  []  Other    Persisting Functional Limitations/Impairments:  [] Sitting [] Standing   [] Walking [] Squatting/bending    [] Stairs [x] ADL's    [] Transfers [x] Reaching  [x] Housework [x] Lifting  [] Driving [] Job related tasks  [] Sports/Recreation  [x] Sleeping  [] Other:    ASSESSMENT:  Pt continues to demonstrate improvement in pain and functional mobility, but remains with decreased scapular strength and stability.  Pt will continue to benefit from skilled PT services to address remaining deficits and return to PLOF.  Treatment/Activity Tolerance:  [x]  Patient able to complete tx  []  Patient limited by fatique  []  Patient limited by pain  []  Patient limited by other medical complications  []  Other:     Prognosis: [x]  Good []  Fair  []  Poor    Patient Requires Follow-up: [x]  Yes  []  No  PLAN: See eval. PT 2x / week for 4 weeks.   [x]  Continue per plan of care []  Alter current plan (see comments)  []  Plan of care initiated []  Hold pending MD visit []  Discharge    Electronically signed by: Starr Sinclair, PT , DPT  [phone]      Note: If patient does not return for scheduled/ recommended follow up visits, his note will serve as a discharge from care along with most recent update on progress.

## 2018-08-05 ENCOUNTER — Inpatient Hospital Stay: Admit: 2018-08-05 | Payer: MEDICAID

## 2018-08-05 NOTE — Other (Signed)
[facility]  Phone: [phone]   Fax: [phone]    Physical Therapy Treatment Note/ Progress Report:     Date:  08/05/2018    Patient Name:  Faith Mason    DOB:  Nov 13, 1975  MRN: [id number]  Restrictions/Precautions:    Medical/Treatment Diagnosis Information:  ?? Diagnosis: Neck pain M54.2,  B shoudler pain M25.511 w B UE radiculopathy  ?? Treatment diagnosis:  Increased pain and decreased scapular stability  Insurance/Certification information:     Physician Information:  Referring Practitioner: [name], MD  Plan of care signed (Y/N): '[x]'   Yes '[]'   No Cosign 2/3    Date of Patient follow up with Physician:  Pt states is to complete PT then MRI  MD appt 2/20     Progress Report: '[]'   Yes  '[x]'   No     Date Range for reporting period:   Beginning: 07/24/2018  Ending:     Progress report due (10 Rx/or 30 days whichever is less): 10 days     Recertification due (POC duration/ or 90 days whichever is less): n;     Visit # Insurance Allowable Auth required? Date Range   6/12 30 v/cy [facility] '[]'   Yes  '[x]'   No      Latex Allergy:  '[x]' NO      '[]' YES  Preferred Language for Healthcare:   '[]' English       '[x]' other: Nepalese  Functional Scale: NDI: Quick Dash  Score 41,  61% disability Date assessed:07/24/2018    Pain level: 5-6 /10 R neck/shoulder pain and tingly    SUBJECTIVE:  2/15 Pt's daughter translating for pt.  Reports she feels better than last time.  Thinks PT tx is helping. Says the HEP ex help. Requests blue tband for HEP     2/11 Pt's daughter translating for pt.  She does have some numbness and burning in a small area above the right shoulder,but it is less than it was. Pt states she felt much better after the mechanical traction. Pt also report Pt's daughter states it seems like the pt is feeling better.     ??? OBJECTIVE:    ??? Observation: 2/15 TPs R UT  ??? Test measurements:      RESTRICTIONS/PRECAUTIONS: Pt's daughter reports she had lower back in  2017    Exercises/Interventions:   Therapeutic Exercise (97110) Resistance / level Sets/sec Reps Notes   UBE  4 min=73F/2B     Chin tuck w/towel behind head   10x Back to wall   CTwith rotation B  CT with flex/ext   10xBack to wall   Mid row Lime green 1 15  TCs/VCs to keep UT relaxed    High row Lime green 1 15 TCs/VCs to keep UT relaxed    LPD   15 TCs/VCs to keep UT relaxed    Per pt request pulley sho flex   15x                  Therapeutic Activities (42595)                                                 NMR re-education [phone])  Manual Intervention [phone])       Cerv mobs/manip              Gentle cervcial distraction  5'  2/15 pt request manual distraction not mech txn   MFR/DTM UT and Levator SCap       2/11, 2/7: supine with roll under knees gentle STM L> R UT, scalenes, suboccipitals, gentle manual cervical txn, gentle manual cervical sb/rot/flex stetches  10 min     2/8: STM B/L UT with trigger point release; manual cervical rotation and side bending stretching     2/15 IASTM R>L UT, levator, scalenes, SCM splenius cervicus and capitus  8'                       Patient education: 2/11: handout  Lime tband 1x/day, 15x high row, lpd, mid row  -pt educated on diagnosis, prognosis and expectations for rehab  -pt provided with written and illustrated instructions for HEP  -all pt questions were answered  2/8: Used spinal model to explain to pt and daughter radiculopathy and what could be causes of numbness in L hand     Therapeutic Exercise and NMR EXR  '[x]'  (97110) Provided verbal/tactile cueing for activities related to strengthening, flexibility, endurance, ROM  for improvements in cervical, postural, scapular, scapulothoracic and UE control with self care, reaching, carrying, lifting, house/yardwork, driving/computer work.    '[]'  [phone]) Provided verbal/tactile cueing for activities related to improving balance, coordination, kinesthetic sense, posture, motor skill,  proprioception  to assist with cervical, scapular, scapulothoracic and UE control with self care, reaching, carrying, lifting, house/yardwork, driving/computer work.    Therapeutic Activities:    '[]'  [phone] or [phone]) Provided verbal/tactile cueing for activities related to improving balance, coordination, kinesthetic sense, posture, motor skill, proprioception and motor activation to allow for proper function of cervical, scapular, scapulothoracic and UE control with self care, carrying, lifting, driving/computer work.     Home Exercise Program:   '[x]'  [phone]) Reviewed/Progressed HEP activities related to strengthening, flexibility, endurance, ROM of cervical, scapular, scapulothoracic and UE control with self care, reaching, carrying, lifting, house/yardwork, driving/computer work  '[]'  (40768) Reviewed/Progressed HEP activities related to improving balance, coordination, kinesthetic sense, posture, motor skill, proprioception of cervical, scapular, scapulothoracic and UE control with self care, reaching, carrying, lifting, house/yardwork, driving/computer work      Manual Treatments:  PROM / STM / Oscillations-Mobs:  G-I, II, III, IV (PA's, Inf., Post.)  '[x]'  (97140) Provided manual therapy to mobilize soft tissue/joints of cervical/CT, scapular GHJ and UE for the purpose of decreasing headache, modulating pain, promoting relaxation,  increasing ROM, reducing/eliminating soft tissue swelling/inflammation/restriction, improving soft tissue extensibility and allowing for proper ROM for normal function with self care, reaching, carrying, lifting, house/yardwork, driving/computer work    Modalities:    2/11:  Pt said PT could increase pull. Patient was positioned supine on traction table with bolsters under bilateral knees with head/neck in traction device. Parameters were as follows: 14/10 max/min pounds with on/off ratio of 30/10, for a total of 12 minutes.Patient was given panic button as well as instructed how to use  it if experiencing pain. Patient was also given bell to call if anything is needed.     07/29/18 Patient was positioned supine on traction table with bolsters under bilateral knees with head/neck in traction device. Parameters were as follows: 12/8 max/min pounds with on/off ratio of 30/10, for a total of 12 minutes.Patient was given panic button as well as instructed  how to use it if experiencing pain. Patient was also given bell to call if anything is needed.     2/15, 2/7: supine with roll under knees, CP neck/L UT 10 min,covered with warm blanket    Charges:  Timed Code Treatment Minutes: 30   Total Treatment Minutes: 40       '[]'  EVAL - LOW (97161)   '[]'  EVAL - MOD (41324)  '[]'  EVAL - HIGH (97163)  '[]'  RE-EVAL (40102)  '[x]'  VO(53664) x  1    '[]'  NMR (40347) x      '[]'  Ionto  '[x]'  Manual (97140) x 1     '[]'  Ultrasound  '[]'  TA x       '[x]'  Mech Traction (42595)  '[]'  Home Management Training x   '[]'  ES (un) (63875):           '[]'  Aquatic    '[]'  ES(attended) (64332)   '[]'  Group    '[]'  Other:    GOALS:Patient stated goal: resolve pain to return to normal   '[x]'  Progressing: '[]'  Met: '[]'  Not Met: '[]'  Adjusted    Therapist goals for Patient:   Short Term Goals: To be achieved in: 2 weeks  1. Independent in HEP and progression per patient tolerance, in order to prevent re-injury.   '[x]'  Progressing: '[]'  Met: '[]'  Not Met: '[]'  Adjusted  2. Patient will have a decrease in pain to facilitate improvement in movement, function, and ADLs as indicated by Functional Deficits.  '[x]'  Progressing: '[]'  Met: '[]'  Not Met: '[]'  Adjusted    Long Term Goals: To be achieved in: 4 weeks  1. Disability index score of 20% or less for the NDI to assist with reaching prior level of function.   '[]'  Progressing: '[]'  Met: '[]'  Not Met: '[]'  Adjusted  2. Patient will demonstrate increased AROM to [facility] of cervical/thoracic spine to allow for proper joint functioning as indicated by patients Functional Deficits.   '[]'  Progressing: '[]'  Met: '[]'  Not Met: '[]'  Adjusted  3. Patient will  demonstrate an increase in postural awareness and control and activation of  Deep cervical stabilizers to allow for proper functional mobility as indicated by patients Functional Deficits.   '[]'  Progressing: '[]'  Met: '[]'  Not Met: '[]'  Adjusted  4. Patient will return to functional activities including cooking, cleaning without increased symptoms or restriction.   '[]'  Progressing: '[]'  Met: '[]'  Not Met: '[]'  Adjusted  5. To be able to sleep through night without waking due to pain  '[]'  Progressing: '[]'  Met: '[]'  Not Met: '[]'  Adjusted         Overall Progression Towards Functional goals/ Treatment Progress Update:  '[]'  Patient is progressing as expected towards functional goals listed.    '[]'  Progression is slowed due to complexities/Impairments listed.  '[]'  Progression has been slowed due to co-morbidities.  '[x]'  Plan just implemented, too soon to assess goals progression <30days   '[]'  Goals require adjustment due to lack of progress  '[]'  Patient is not progressing as expected and requires additional follow up with physician  '[]'  Other    Persisting Functional Limitations/Impairments:  '[]' Sitting '[]' Standing   '[]' Walking '[]' Squatting/bending    '[]' Stairs '[x]' ADL's    '[]' Transfers '[x]' Reaching  '[x]' Housework '[x]' Lifting  '[]' Driving '[]' Job related tasks  '[]' Sports/Recreation  '[x]' Sleeping  '[]' Other:    ASSESSMENT:  Pt continues to demonstrate improvement in pain and functional mobility, but remains with decreased scapular strength and stability.  Pt will continue to benefit from skilled PT services to address remaining deficits and  return to PLOF.  Treatment/Activity Tolerance:  '[x]'  Patient able to complete tx  '[]'  Patient limited by fatique  '[]'  Patient limited by pain  '[]'  Patient limited by other medical complications  '[]'  Other:     Prognosis: '[x]'  Good '[]'  Fair  '[]'  Poor    Patient Requires Follow-up: '[x]'  Yes  '[]'  No    PLAN: See eval. PT 2x / week for 4 weeks.   '[x]'  Continue per plan of care '[]'  Alter current plan (see comments)  '[]'  Plan of care initiated '[]'   Hold pending MD visit '[]'  Discharge    Electronically signed by: Garner Nash, PT , DPT  [phone]      Note: If patient does not return for scheduled/ recommended follow up visits, his note will serve as a discharge from care along with most recent update on progress.

## 2018-08-07 ENCOUNTER — Inpatient Hospital Stay: Admit: 2018-08-07 | Payer: MEDICAID

## 2018-08-07 NOTE — Other (Signed)
[facility]  Phone: [phone]   Fax: [phone]    Physical Therapy Treatment Note/ Progress Report:     Date:  08/07/2018    Patient Name:  NIAJAH SIPOS    DOB:  March 24, 1976  MRN: [id number]  Restrictions/Precautions:    Medical/Treatment Diagnosis Information:  ?? Diagnosis: Neck pain M54.2,  B shoudler pain M25.511 w B UE radiculopathy  ?? Treatment diagnosis:  Increased pain and decreased scapular stability  Insurance/Certification information:     Physician Information:  Referring Practitioner: [name], MD  Plan of care signed (Y/N): '[x]'   Yes '[]'   No Cosign 2/3    Date of Patient follow up with Physician:  Pt states is to complete PT then MRI  MD appt 2/20     Progress Report: '[]'   Yes  '[x]'   No     Date Range for reporting period:   Beginning: 07/24/2018  Ending:     Progress report due (10 Rx/or 30 days whichever is less): 10 days     Recertification due (POC duration/ or 90 days whichever is less): n;     Visit # Insurance Allowable Auth required? Date Range   7/12 30 v/cy [facility] '[]'   Yes  '[x]'   No      Latex Allergy:  '[x]' NO      '[]' YES  Preferred Language for Healthcare:   '[]' English       '[x]' other: Nepalese  Functional Scale: NDI: Quick Dash  Score 41,  61% disability Date assessed:07/24/2018    Pain level: 6 /10 R neck/shoulder pain and tingly    SUBJECTIVE:  2/17: daughter present for translation;  Patient reporting right sided mid cervical pain.  Feels that traction is helping.  Also has some hand numbness and burning in her right shoulder blade that is intermittent in nature.  2/15 Pt's daughter translating for pt.  Reports she feels better than last time.  Thinks PT tx is helping. Says the HEP ex help. Requests blue tband for HEP     2/11 Pt's daughter translating for pt.  She does have some numbness and burning in a small area above the right shoulder,but it is less than it was. Pt states she felt much better after the mechanical traction. Pt also  report Pt's daughter states it seems like the pt is feeling better.     ??? OBJECTIVE:    ??? Observation: 2/15 TPs R UT  ??? Test measurements:      RESTRICTIONS/PRECAUTIONS: Pt's daughter reports she had lower back in 2017    Exercises/Interventions:   Therapeutic Exercise (97110) Resistance / level Sets/sec Reps Notes   UBE  4 min=52F/2B     Chin tuck w/towel behind head   10x Back to wall   CTwith rotation B  CT with flex/ext   10xBack to wall   Mid row at free motion 25# 1 15  TCs/VCs to keep UT relaxed    High row TCs/VCs to keep UT relaxed    LPD TCs/VCs to keep UT relaxed    Per pt request pulley sho flex    Free weights--shld abd to 90  scap retract  Bent over ext 1 #  0#  1# '1  1  1 10  10  10    ' Ball on wall---rot at 90  Serratus rot  X 10 each B     Therapeutic Activities (32951)       pec stretch at doorway  30 sec 3                                       NMR re-education [phone])                                          Manual Intervention (97140)       Cerv mobs/manip              Gentle cervcial distraction      MFR/DTM UT and Levator SCap      2/11, 2/7: supine with roll under knees gentle STM L> R UT, scalenes, suboccipitals, gentle manual cervical txn, gentle manual cervical sb/rot/flex stetches      2/8: STM B/L UT with trigger point release; manual cervical rotation and side bending stretching     2/15 IASTM R>L UT, levator, scalenes, SCM splenius cervicus and capitus                        Patient education: 2/11: handout  Lime tband 1x/day, 15x high row, lpd, mid row  -pt educated on diagnosis, prognosis and expectations for rehab  -pt provided with written and illustrated instructions for HEP  -all pt questions were answered  2/8: Used spinal model to explain to pt and daughter radiculopathy and what could be causes of numbness in L hand     Therapeutic Exercise and NMR EXR  '[x]'  (97110) Provided verbal/tactile cueing for activities related to strengthening, flexibility, endurance, ROM  for  improvements in cervical, postural, scapular, scapulothoracic and UE control with self care, reaching, carrying, lifting, house/yardwork, driving/computer work.    '[]'  [phone]) Provided verbal/tactile cueing for activities related to improving balance, coordination, kinesthetic sense, posture, motor skill, proprioception  to assist with cervical, scapular, scapulothoracic and UE control with self care, reaching, carrying, lifting, house/yardwork, driving/computer work.    Therapeutic Activities:    '[]'  [phone] or 36644) Provided verbal/tactile cueing for activities related to improving balance, coordination, kinesthetic sense, posture, motor skill, proprioception and motor activation to allow for proper function of cervical, scapular, scapulothoracic and UE control with self care, carrying, lifting, driving/computer work.     Home Exercise Program:   '[x]'  [phone]) Reviewed/Progressed HEP activities related to strengthening, flexibility, endurance, ROM of cervical, scapular, scapulothoracic and UE control with self care, reaching, carrying, lifting, house/yardwork, driving/computer work  '[]'  [phone]) Reviewed/Progressed HEP activities related to improving balance, coordination, kinesthetic sense, posture, motor skill, proprioception of cervical, scapular, scapulothoracic and UE control with self care, reaching, carrying, lifting, house/yardwork, driving/computer work      Manual Treatments:  PROM / STM / Oscillations-Mobs:  G-I, II, III, IV (PA's, Inf., Post.)  '[x]'  (97140) Provided manual therapy to mobilize soft tissue/joints of cervical/CT, scapular GHJ and UE for the purpose of decreasing headache, modulating pain, promoting relaxation,  increasing ROM, reducing/eliminating soft tissue swelling/inflammation/restriction, improving soft tissue extensibility and allowing for proper ROM for normal function with self care, reaching, carrying, lifting, house/yardwork, driving/computer work    Modalities:    2/17:  Pt said PT  could increase pull. Patient was positioned supine on traction table with bolsters under bilateral knees with head/neck in traction device. Parameters were as follows: 12/18 max/min pounds with on/off ratio of 30/10, for a total of 12 minutes.Patient was  given panic button as well as instructed how to use it if experiencing pain. Patient was also given bell to call if anything is needed.     07/29/18 Patient was positioned supine on traction table with bolsters under bilateral knees with head/neck in traction device. Parameters were as follows: 12/8 max/min pounds with on/off ratio of 30/10, for a total of 12 minutes.Patient was given panic button as well as instructed how to use it if experiencing pain. Patient was also given bell to call if anything is needed.     2/15, 2/7: supine with roll under knees, CP neck/L UT 10 min,covered with warm blanket    Charges:  Timed Code Treatment Minutes: 29   Total Treatment Minutes: 41       '[]'  EVAL - LOW (97161)   '[]'  EVAL - MOD (96789)  '[]'  EVAL - HIGH (38101)  '[]'  RE-EVAL (75102)  '[x]'  HE(52778) x  2    '[]'  NMR (24235) x      '[]'  Ionto  '[]'  Manual (97140) x      '[]'  Ultrasound  '[]'  TA x       '[x]'  Mech Traction (36144)  '[]'  Home Management Training x   '[]'  ES (un) (97014):           '[]'  Aquatic    '[]'  ES(attended) (31540)   '[]'  Group    '[]'  Other:    GOALS:Patient stated goal: resolve pain to return to normal   '[x]'  Progressing: '[]'  Met: '[]'  Not Met: '[]'  Adjusted    Therapist goals for Patient:   Short Term Goals: To be achieved in: 2 weeks  1. Independent in HEP and progression per patient tolerance, in order to prevent re-injury.   '[x]'  Progressing: '[]'  Met: '[]'  Not Met: '[]'  Adjusted  2. Patient will have a decrease in pain to facilitate improvement in movement, function, and ADLs as indicated by Functional Deficits.  '[x]'  Progressing: '[]'  Met: '[]'  Not Met: '[]'  Adjusted    Long Term Goals: To be achieved in: 4 weeks  1. Disability index score of 20% or less for the NDI to assist with reaching prior  level of function.   '[]'  Progressing: '[]'  Met: '[]'  Not Met: '[]'  Adjusted  2. Patient will demonstrate increased AROM to [facility] of cervical/thoracic spine to allow for proper joint functioning as indicated by patients Functional Deficits.   '[]'  Progressing: '[]'  Met: '[]'  Not Met: '[]'  Adjusted  3. Patient will demonstrate an increase in postural awareness and control and activation of  Deep cervical stabilizers to allow for proper functional mobility as indicated by patients Functional Deficits.   '[]'  Progressing: '[]'  Met: '[]'  Not Met: '[]'  Adjusted  4. Patient will return to functional activities including cooking, cleaning without increased symptoms or restriction.   '[]'  Progressing: '[]'  Met: '[]'  Not Met: '[]'  Adjusted  5. To be able to sleep through night without waking due to pain  '[]'  Progressing: '[]'  Met: '[]'  Not Met: '[]'  Adjusted         Overall Progression Towards Functional goals/ Treatment Progress Update:  '[]'  Patient is progressing as expected towards functional goals listed.    '[]'  Progression is slowed due to complexities/Impairments listed.  '[]'  Progression has been slowed due to co-morbidities.  '[x]'  Plan just implemented, too soon to assess goals progression <30days   '[]'  Goals require adjustment due to lack of progress  '[]'  Patient is not progressing as expected and requires additional follow up with physician  '[]'  Other  Persisting Functional Limitations/Impairments:  '[]' Sitting '[]' Standing   '[]' Walking '[]' Squatting/bending    '[]' Stairs '[x]' ADL's    '[]' Transfers '[x]' Reaching  '[x]' Housework '[x]' Lifting  '[]' Driving '[]' Job related tasks  '[]' Sports/Recreation  '[x]' Sleeping  '[]' Other:    ASSESSMENT:  Pt continues to demonstrate improvement in pain and functional mobility, but remains with decreased scapular strength and stability.  Pt will continue to benefit from skilled PT services to address remaining deficits and return to PLOF.  Treatment/Activity Tolerance:  '[x]'  Patient able to complete tx  '[]'  Patient limited by fatique  '[]'  Patient limited by  pain  '[]'  Patient limited by other medical complications  '[]'  Other:     Prognosis: '[x]'  Good '[]'  Fair  '[]'  Poor    Patient Requires Follow-up: '[x]'  Yes  '[]'  No    PLAN: See eval. PT 2x / week for 4 weeks.   '[x]'  Continue per plan of care '[]'  Alter current plan (see comments)  '[]'  Plan of care initiated '[]'  Hold pending MD visit '[]'  Discharge    Electronically signed by: Gillermina Hu, PT , DPT  [phone]      Note: If patient does not return for scheduled/ recommended follow up visits, his note will serve as a discharge from care along with most recent update on progress.

## 2018-08-08 ENCOUNTER — Inpatient Hospital Stay: Admit: 2018-08-08 | Payer: MEDICAID

## 2018-08-08 NOTE — Other (Signed)
[facility] - Outpatient Physical Therapy  Phone: [phone]   Fax: [phone]    Physical Therapy Treatment Note/ Progress Report:     Date:  08/08/2018    Patient Name:  Faith Mason    DOB:  23-Sep-1975  MRN: [id number]  Restrictions/Precautions:    Medical/Treatment Diagnosis Information:   Diagnosis: Neck pain M54.2,  B shoudler pain M25.511 w B UE radiculopathy   Treatment diagnosis:  Increased pain and decreased scapular stability  Insurance/Certification information:     Physician Information:  Referring Practitioner: Eveline Keto, MD  Plan of care signed (Y/N): [x]   Yes []   No Cosign 2/3    Date of Patient follow up with Physician:  Pt states is to complete PT then MRI  MD appt 2/20     Progress Report: []   Yes  [x]   No     Date Range for reporting period:   Beginning: 07/24/2018  Ending:     Progress report due (10 Rx/or 30 days whichever is less): 10 days     Recertification due (POC duration/ or 90 days whichever is less): n;     Visit # Insurance Allowable Auth required? Date Range   8/12 30 v/cy [facility] []   Yes  [x]   No      Latex Allergy:  [x] NO      [] YES  Preferred Language for Healthcare:   [] English       [x] other: Nepalese  Functional Scale: NDI: Quick Dash  Score 41,  61% disability Date assessed:07/24/2018    Pain level:  5/10 R neck/shoulder pain and tingly    SUBJECTIVE:   Felt about the same following last session but feels traction is helping.  Continues to complain of R shoulder blade N&T with any RUE movements.  Having pain R posterolateral neck, where it typically is.      2/17: daughter present for translation;  Patient reporting right sided mid cervical pain.  Feels that traction is helping.  Also has some hand numbness and burning in her right shoulder blade that is intermittent in nature.  2/15 Pt's daughter translating for pt.  Reports she feels better than last time.  Thinks PT tx is helping. Says the HEP ex help. Requests blue tband for HEP     2/11 Pt's  daughter translating for pt.  She does have some numbness and burning in a small area above the right shoulder,but it is less than it was. Pt states she felt much better after the mechanical traction. Pt also report Pt's daughter states it seems like the pt is feeling better.     . OBJECTIVE:    . Observation: 2/15 TPs R UT  . Test measurements:      RESTRICTIONS/PRECAUTIONS: Pt's daughter reports she had lower back in 2017    Exercises/Interventions:   Therapeutic Exercise (97110) Resistance / level Sets/sec Reps Notes   UBE  4 min =48F/2B     Seated:   Maintained B scap retract, cervical retraction   Chin tuck w/towel behind head     x15           Back to wall   CTwith rotation B  CT with flex/ext   Back to wall   Mid row at free motion 25# 1   TCs/VCs to keep UT relaxed    TB:   High row   D1 ext   Shld add   Blue  blue    12  12TCs/VCs  to keep UT relaxed    LPD TCs/VCs to keep UT relaxed    Per pt request pulley sho flex    Free weights--shld abd to 90  scap retract  Bent over ext 1 #  0#  1# 1  1  1      Ball on wall---rot at 90  Serratus rot       Therapeutic Activities (97530)       pec stretch at doorway  30 sec                                        NMR re-education (56213)                                          Manual Intervention (97140)       Cerv mobs/manip              Gentle cervcial distraction  1 min     2/18:  STM R cervical paraspinals, B upper trap, R SCM, R suboccipitals   R upper trap stretch   R scalene stretch   SOR   B chest stretch  7 mins      3x20"  2x20"  2 mins  3x20"       Added UT stretch to HEP 2/18   MFR/DTM UT and Levator SCap      2/11, 2/7: supine with roll under knees gentle STM L> R UT, scalenes, suboccipitals, gentle manual cervical txn, gentle manual cervical sb/rot/flex stetches      2/8: STM B/L UT with trigger point release; manual cervical rotation and side bending stretching     2/15 IASTM R>L UT, levator, scalenes, SCM splenius cervicus and capitus                         HEP Instruction:  2/18:  Added R upper trap stretch w/R hand on edge of table as needed to HEP, distributed handout    Patient education: 2/11: handout  Lime tband 1x/day, 15x high row, lpd, mid row  -pt educated on diagnosis, prognosis and expectations for rehab  -pt provided with written and illustrated instructions for HEP  -all pt questions were answered  2/8: Used spinal model to explain to pt and daughter radiculopathy and what could be causes of numbness in L hand     Therapeutic Exercise and NMR EXR  [x]  (97110) Provided verbal/tactile cueing for activities related to strengthening, flexibility, endurance, ROM  for improvements in cervical, postural, scapular, scapulothoracic and UE control with self care, reaching, carrying, lifting, house/yardwork, driving/computer work.    []  [phone]) Provided verbal/tactile cueing for activities related to improving balance, coordination, kinesthetic sense, posture, motor skill, proprioception  to assist with cervical, scapular, scapulothoracic and UE control with self care, reaching, carrying, lifting, house/yardwork, driving/computer work.    Therapeutic Activities:    []  [phone] or 29528) Provided verbal/tactile cueing for activities related to improving balance, coordination, kinesthetic sense, posture, motor skill, proprioception and motor activation to allow for proper function of cervical, scapular, scapulothoracic and UE control with self care, carrying, lifting, driving/computer work.     Home Exercise Program:   [x]  (97110) Reviewed/Progressed HEP activities related to strengthening, flexibility, endurance, ROM of cervical, scapular, scapulothoracic and UE control  with self care, reaching, carrying, lifting, house/yardwork, driving/computer work  []  [phone]) Reviewed/Progressed HEP activities related to improving balance, coordination, kinesthetic sense, posture, motor skill, proprioception of cervical, scapular, scapulothoracic and UE control with  self care, reaching, carrying, lifting, house/yardwork, driving/computer work      Manual Treatments:  PROM / STM / Oscillations-Mobs:  G-I, II, III, IV (PA's, Inf., Post.)  [x]  (14782) Provided manual therapy to mobilize soft tissue/joints of cervical/CT, scapular GHJ and UE for the purpose of decreasing headache, modulating pain, promoting relaxation,  increasing ROM, reducing/eliminating soft tissue swelling/inflammation/restriction, improving soft tissue extensibility and allowing for proper ROM for normal function with self care, reaching, carrying, lifting, house/yardwork, driving/computer work    Modalities:    Patient had to leave after treatment, no time for traction, wants to perform next session    2/17:  Pt said PT could increase pull. Patient was positioned supine on traction table with bolsters under bilateral knees with head/neck in traction device. Parameters were as follows: 12/18 max/min pounds with on/off ratio of 30/10, for a total of 12 minutes.Patient was given panic button as well as instructed how to use it if experiencing pain. Patient was also given bell to call if anything is needed.     07/29/18 Patient was positioned supine on traction table with bolsters under bilateral knees with head/neck in traction device. Parameters were as follows: 12/8 max/min pounds with on/off ratio of 30/10, for a total of 12 minutes.Patient was given panic button as well as instructed how to use it if experiencing pain. Patient was also given bell to call if anything is needed.     2/15, 2/7: supine with roll under knees, CP neck/L UT 10 min,covered with warm blanket    Charges:  Timed Code Treatment Minutes: 29   Total Treatment Minutes: 29       []  EVAL - LOW (97161)   []  EVAL - MOD (95621)  []  EVAL - HIGH (97163)  []  RE-EVAL (30865)  [x]  HQ(46962) x  1    []  NMR (97112) x      []  Ionto  [x]  Manual (97140) x  1    []  Ultrasound  []  TA x       [x]  Mech Traction (95284)  []  Home Management Training x   []  ES  (un) (13244):           []  Aquatic    []  ES(attended) (01027)   []  Group    []  Other:    GOALS:Patient stated goal: resolve pain to return to normal   [x]  Progressing: []  Met: []  Not Met: []  Adjusted    Therapist goals for Patient:   Short Term Goals: To be achieved in: 2 weeks  1. Independent in HEP and progression per patient tolerance, in order to prevent re-injury.   [x]  Progressing: []  Met: []  Not Met: []  Adjusted  2. Patient will have a decrease in pain to facilitate improvement in movement, function, and ADLs as indicated by Functional Deficits.  [x]  Progressing: []  Met: []  Not Met: []  Adjusted    Long Term Goals: To be achieved in: 4 weeks  1. Disability index score of 20% or less for the NDI to assist with reaching prior level of function.   []  Progressing: []  Met: []  Not Met: []  Adjusted  2. Patient will demonstrate increased AROM to [facility] of cervical/thoracic spine to allow for proper joint functioning as indicated by patients Functional Deficits.   []  Progressing: []  Met: []   Not Met: []  Adjusted  3. Patient will demonstrate an increase in postural awareness and control and activation of  Deep cervical stabilizers to allow for proper functional mobility as indicated by patients Functional Deficits.   []  Progressing: []  Met: []  Not Met: []  Adjusted  4. Patient will return to functional activities including cooking, cleaning without increased symptoms or restriction.   []  Progressing: []  Met: []  Not Met: []  Adjusted  5. To be able to sleep through night without waking due to pain  []  Progressing: []  Met: []  Not Met: []  Adjusted         Overall Progression Towards Functional goals/ Treatment Progress Update:  []  Patient is progressing as expected towards functional goals listed.    []  Progression is slowed due to complexities/Impairments listed.  []  Progression has been slowed due to co-morbidities.  [x]  Plan just implemented, too soon to assess goals progression <30days   []  Goals require adjustment due to  lack of progress  []  Patient is not progressing as expected and requires additional follow up with physician  []  Other    Persisting Functional Limitations/Impairments:  [] Sitting [] Standing   [] Walking [] Squatting/bending    [] Stairs [x] ADL's    [] Transfers [x] Reaching  [x] Housework [x] Lifting  [] Driving [] Job related tasks  [] Sports/Recreation  [x] Sleeping  [] Other:    ASSESSMENT:      Treatment/Activity Tolerance:  [x]  Patient able to complete tx  []  Patient limited by fatique  []  Patient limited by pain  []  Patient limited by other medical complications  []  Other:     Prognosis: [x]  Good []  Fair  []  Poor    Patient Requires Follow-up: [x]  Yes  []  No    PLAN: See eval. PT 2x / week for 4 weeks.   [x]  Continue per plan of care []  Alter current plan (see comments)  []  Plan of care initiated []  Hold pending MD visit []  Discharge    Electronically signed by: Dorothe Pea, PT , DPT        Note: If patient does not return for scheduled/ recommended follow up visits, his note will serve as a discharge from care along with most recent update on progress.

## 2018-08-10 ENCOUNTER — Inpatient Hospital Stay: Admit: 2018-08-10 | Payer: MEDICAID

## 2018-08-10 NOTE — Other (Signed)
[facility]  Phone: [phone]   Fax: [phone]    Physical Therapy Treatment Note/ Progress Report:     Date:  08/10/2018    Patient Name:  Faith Mason    DOB:  1976/03/10  MRN: [id number]  Restrictions/Precautions:    Medical/Treatment Diagnosis Information:  ?? Diagnosis: Neck pain M54.2,  B shoudler pain M25.511 w B UE radiculopathy  ?? Treatment diagnosis:  Increased pain and decreased scapular stability  Insurance/Certification information:     Physician Information:  Referring Practitioner: [name], MD  Plan of care signed (Y/N): '[x]'   Yes '[]'   No Cosign 2/3    Date of Patient follow up with Physician:  Pt states is to complete PT then MRI  MD appt 2/20     Progress Report: '[]'   Yes  '[x]'   No     Date Range for reporting period:   Beginning: 07/24/2018  Ending:     Progress report due (10 Rx/or 30 days whichever is less): 10 days     Recertification due (POC duration/ or 90 days whichever is less): n;     Visit # Insurance Allowable Auth required? Date Range   9/12 30 v/cy [facility] '[]'   Yes  '[x]'   No      Latex Allergy:  '[x]' NO      '[]' YES  Preferred Language for Healthcare:   '[]' English       '[x]' other: Nepalese  Functional Scale: NDI: Quick Dash  Score 41,  61% disability Date assessed:07/24/2018    Pain level:  3-4/10 R neck/shoulder pain and tingly    SUBJECTIVE:    Is doing pretty good today, pain is feeling good but N&T on R scapula remains the same.  Pain will increase with RUE movements.  Felt pretty good following last sessions.       2/17: daughter present for translation;  Patient reporting right sided mid cervical pain.  Feels that traction is helping.  Also has some hand numbness and burning in her right shoulder blade that is intermittent in nature.  2/15 Pt's daughter translating for pt.  Reports she feels better than last time.  Thinks PT tx is helping. Says the HEP ex help. Requests blue tband for HEP     2/11 Pt's daughter translating for pt.   She does have some numbness and burning in a small area above the right shoulder,but it is less than it was. Pt states she felt much better after the mechanical traction. Pt also report Pt's daughter states it seems like the pt is feeling better.     ??? OBJECTIVE:    ??? Observation: 2/15 TPs R UT  ??? Test measurements:      RESTRICTIONS/PRECAUTIONS: Pt's daughter reports she had lower back in 2017    Exercises/Interventions:   Therapeutic Exercise (97110) Resistance / level Sets/sec Reps Notes   UBE  4 min =1F/2B     Seated:  ?? Maintained B scap retract, cervical retraction  ?? Chin tuck w/towel behind head                Back to wall   CTwith rotation B  CT with flex/ext   Back to wall   Mid row at free motion 25# 1   TCs/VCs to keep UT relaxed    TB:  ?? High row  ?? D1 ext  ?? Shld add Lime green    Blue  blue 1 15  12  12 TCs/VCs to keep UT relaxed    LPD TCs/VCs to keep UT relaxed    Per pt request pulley sho flex    Free weights--shld abd to 90  scap retract  Bent over ext 1 #  0#  1# '1  1  1     ' Ball on wall---rot at 90  Serratus rot  ?? Chin tuck  ?? B scap retract  ?? CW & CCW circles w/shld flexed to 90 deg       '15  15  15 ' ea B    Unilateral pec stretch at doorway  20 sec 2           Therapeutic Activities [phone]       2/20:  After pt & dtr request, educated pt and dtr on probable nerve impingement causing symptoms and effects of good posture on reducing neural compression and symptoms using cervical model. Both reported understanding                                   NMR re-education (35361)                                          Manual Intervention (97140)       Cerv mobs/manip  ?? P/A mobs T2, T1, C7  ?? Gentle cervical distraction  3 mins            ?? STM R cervical paraspinals, B upper trap, R SCM, R suboccipitals  ?? R upper trap stretch  ?? R scalene stretch  ?? SOR  ?? B chest stretch  4 mins      3x20"           Added UT stretch to HEP 2/18   MFR/DTM UT and Levator SCap      2/11, 2/7: supine with roll  under knees gentle STM L> R UT, scalenes, suboccipitals, gentle manual cervical txn, gentle manual cervical sb/rot/flex stetches      2/8: STM B/L UT with trigger point release; manual cervical rotation and side bending stretching     2/15 IASTM R>L UT, levator, scalenes, SCM splenius cervicus and capitus                        HEP Instruction:  2/18:  Added R upper trap stretch w/R hand on edge of table as needed to HEP, distributed handout    Patient education: 2/11: handout  Lime tband 1x/day, 15x high row, lpd, mid row  -pt educated on diagnosis, prognosis and expectations for rehab  -pt provided with written and illustrated instructions for HEP  -all pt questions were answered  2/8: Used spinal model to explain to pt and daughter radiculopathy and what could be causes of numbness in L hand     Therapeutic Exercise and NMR EXR  '[x]'  (97110) Provided verbal/tactile cueing for activities related to strengthening, flexibility, endurance, ROM  for improvements in cervical, postural, scapular, scapulothoracic and UE control with self care, reaching, carrying, lifting, house/yardwork, driving/computer work.    '[]'  [phone]) Provided verbal/tactile cueing for activities related to improving balance, coordination, kinesthetic sense, posture, motor skill, proprioception  to assist with cervical, scapular, scapulothoracic and UE control with self care, reaching, carrying, lifting, house/yardwork, driving/computer work.  Therapeutic Activities:    '[]'  [phone] or [phone]) Provided verbal/tactile cueing for activities related to improving balance, coordination, kinesthetic sense, posture, motor skill, proprioception and motor activation to allow for proper function of cervical, scapular, scapulothoracic and UE control with self care, carrying, lifting, driving/computer work.     Home Exercise Program:   '[x]'  [phone]) Reviewed/Progressed HEP activities related to strengthening, flexibility, endurance, ROM of cervical, scapular,  scapulothoracic and UE control with self care, reaching, carrying, lifting, house/yardwork, driving/computer work  '[]'  [phone]) Reviewed/Progressed HEP activities related to improving balance, coordination, kinesthetic sense, posture, motor skill, proprioception of cervical, scapular, scapulothoracic and UE control with self care, reaching, carrying, lifting, house/yardwork, driving/computer work      Manual Treatments:  PROM / STM / Oscillations-Mobs:  G-I, II, III, IV (PA's, Inf., Post.)  '[x]'  (97140) Provided manual therapy to mobilize soft tissue/joints of cervical/CT, scapular GHJ and UE for the purpose of decreasing headache, modulating pain, promoting relaxation,  increasing ROM, reducing/eliminating soft tissue swelling/inflammation/restriction, improving soft tissue extensibility and allowing for proper ROM for normal function with self care, reaching, carrying, lifting, house/yardwork, driving/computer work    Modalities:    Mechanical cervical traction, 12/18, 30/10 on/off x10 mins  -can increase pull next session per pt    2/17:  Pt said PT could increase pull. Patient was positioned supine on traction table with bolsters under bilateral knees with head/neck in traction device. Parameters were as follows: 12/18 max/min pounds with on/off ratio of 30/10, for a total of 12 minutes.Patient was given panic button as well as instructed how to use it if experiencing pain. Patient was also given bell to call if anything is needed.     07/29/18 Patient was positioned supine on traction table with bolsters under bilateral knees with head/neck in traction device. Parameters were as follows: 12/8 max/min pounds with on/off ratio of 30/10, for a total of 12 minutes.Patient was given panic button as well as instructed how to use it if experiencing pain. Patient was also given bell to call if anything is needed.     2/15, 2/7: supine with roll under knees, CP neck/L UT 10 min,covered with warm blanket    Charges:  Timed  Code Treatment Minutes: 29   Total Treatment Minutes: 40       '[]'  EVAL - LOW (97161)   '[]'  EVAL - MOD (97353)  '[]'  EVAL - HIGH (29924)  '[]'  RE-EVAL (26834)  '[x]'  HD(62229) x  1    '[]'  NMR (79892) x      '[]'  Ionto  '[x]'  Manual (97140) x  1    '[]'  Ultrasound  '[]'  TA x       '[x]'  Mech Traction (11941)  '[]'  Home Management Training x   '[]'  ES (un) (74081):           '[]'  Aquatic    '[]'  ES(attended) (44818)   '[]'  Group    '[]'  Other:    GOALS:Patient stated goal: resolve pain to return to normal   '[x]'  Progressing: '[]'  Met: '[]'  Not Met: '[]'  Adjusted    Therapist goals for Patient:   Short Term Goals: To be achieved in: 2 weeks  1. Independent in HEP and progression per patient tolerance, in order to prevent re-injury.   '[x]'  Progressing: '[]'  Met: '[]'  Not Met: '[]'  Adjusted  2. Patient will have a decrease in pain to facilitate improvement in movement, function, and ADLs as indicated by Functional Deficits.  '[x]'  Progressing: '[]'  Met: '[]'  Not Met: '[]'   Adjusted    Long Term Goals: To be achieved in: 4 weeks  1. Disability index score of 20% or less for the NDI to assist with reaching prior level of function.   '[]'  Progressing: '[]'  Met: '[]'  Not Met: '[]'  Adjusted  2. Patient will demonstrate increased AROM to [facility] of cervical/thoracic spine to allow for proper joint functioning as indicated by patients Functional Deficits.   '[]'  Progressing: '[]'  Met: '[]'  Not Met: '[]'  Adjusted  3. Patient will demonstrate an increase in postural awareness and control and activation of  Deep cervical stabilizers to allow for proper functional mobility as indicated by patients Functional Deficits.   '[]'  Progressing: '[]'  Met: '[]'  Not Met: '[]'  Adjusted  4. Patient will return to functional activities including cooking, cleaning without increased symptoms or restriction.   '[]'  Progressing: '[]'  Met: '[]'  Not Met: '[]'  Adjusted  5. To be able to sleep through night without waking due to pain  '[]'  Progressing: '[]'  Met: '[]'  Not Met: '[]'  Adjusted         Overall Progression Towards Functional goals/  Treatment Progress Update:  '[]'  Patient is progressing as expected towards functional goals listed.    '[]'  Progression is slowed due to complexities/Impairments listed.  '[]'  Progression has been slowed due to co-morbidities.  '[x]'  Plan just implemented, too soon to assess goals progression <30days   '[]'  Goals require adjustment due to lack of progress  '[]'  Patient is not progressing as expected and requires additional follow up with physician  '[]'  Other    Persisting Functional Limitations/Impairments:  '[]' Sitting '[]' Standing   '[]' Walking '[]' Squatting/bending    '[]' Stairs '[x]' ADL's    '[]' Transfers '[x]' Reaching  '[x]' Housework '[x]' Lifting  '[]' Driving '[]' Job related tasks  '[]' Sports/Recreation  '[x]' Sleeping  '[]' Other:    ASSESSMENT:      Treatment/Activity Tolerance:  '[x]'  Patient able to complete tx  '[]'  Patient limited by fatique  '[]'  Patient limited by pain  '[]'  Patient limited by other medical complications  '[]'  Other:     Prognosis: '[x]'  Good '[]'  Fair  '[]'  Poor    Patient Requires Follow-up: '[x]'  Yes  '[]'  No    PLAN: See eval. PT 2x / week for 4 weeks.   '[x]'  Continue per plan of care '[]'  Alter current plan (see comments)  '[]'  Plan of care initiated '[]'  Hold pending MD visit '[]'  Discharge    Electronically signed by: Gunnar Fusi, PT , DPT        Note: If patient does not return for scheduled/ recommended follow up visits, his note will serve as a discharge from care along with most recent update on progress.

## 2018-08-14 ENCOUNTER — Inpatient Hospital Stay: Admit: 2018-08-14 | Payer: MEDICAID

## 2018-08-14 NOTE — Other (Signed)
[facility]  Phone: [phone]   Fax: [phone]    Physical Therapy Treatment Note/ Progress Report:     Date:  08/14/2018   Physical Therapy Re-Certification Plan of Care    Dear  , Dr.Davit,     We had the pleasure of treating the following patient for physical therapy services at [facility].  A summary of our findings can be found in the updated assessment below.  This includes our plan of care.  If you have any questions or concerns regarding these findings, please do not hesitate to contact me at the office phone number checked above.  Thank you for the referral.     Physician Signature:________________________________Date:__________________  By signing above (or electronic signature), therapist???s plan is approved by physician      Functional Outcome:  Quick Dash Score    34 // 52% disability  08/14/2018      Overall Response to Treatment:   '[x]' Patient is responding well to treatment and improvement is noted with regards  to goals   '[]' Patient should continue to improve in reasonable time if they continue HEP   '[]' Patient has plateaued and is no longer responding to skilled PT intervention    '[]' Patient is getting worse and would benefit from return to referring MD   '[]' Patient unable to adhere to initial POC   '[]' Other:     Date range of Visits: 2/3/  - 08/14/2018  Total Visits: 10    Recommendation:   Pt also requested referral for her lumbar pain  Please send Rx if approved.     '[x]' Continue PT 3  x / wk for4 weeks..    '[]' Hold PT, pending MD visit    Patient Name:  Faith Mason    DOB:  06-18-1976  MRN: [id number]  Restrictions/Precautions:    Medical/Treatment Diagnosis Information:  ?? Diagnosis: Neck pain M54.2,  B shoudler pain M25.511 w B UE radiculopathy  ?? Treatment diagnosis:  Increased pain and decreased scapular stability  Insurance/Certification information:     Physician Information:  Referring Practitioner: [name], MD  Plan of  care signed (Y/N): '[x]'   Yes '[]'   No Cosign 2/3    Date of Patient follow up with Physician:  Pt states is to complete PT then MRI  MD appt 2/20     Progress Report: '[x]'   Yes  '[]'   No     Date Range for reporting period:   Beginning: 07/24/2018  Ending:  08/14/2018  Progress report due (10 Rx/or 30 days whichever is less): 10 days     Recertification due (POC duration/ or 90 days whichever is less): n;     Visit # Insurance Allowable Auth required? Date Range   10/12 30 v/cy [facility] '[]'   Yes  '[x]'   No      Latex Allergy:  '[x]' NO      '[]' YES  Preferred Language for Healthcare:   '[]' English       '[x]' other: Nepalese  Functional Scale: Quick Dash  Score 41,  61% disability Date assessed:07/24/2018    Pain level:  35-6/10 R neck/shoulder pain and tingly    SUBJECTIVE:   2/24  Pt ddtr translated for pt today,  Pt states having only n/t on R scapula , and on L lateral brachium.  L UE N/T is now intermittent and is able to perform more ADL/IADL, sleeping better as well.  2/17: daughter present for translation;  Patient reporting right sided mid cervical pain.  Feels that traction is helping.  Also has some hand numbness and burning in her right shoulder blade that is intermittent in nature.  2/15 Pt's daughter translating for pt.  Reports she feels better than last time.  Thinks PT tx is helping. Says the HEP ex help. Requests blue tband for HEP     2/11 Pt's daughter translating for pt.  She does have some numbness and burning in a small area above the right shoulder,but it is less than it was. Pt states she felt much better after the mechanical traction. Pt also report Pt's daughter states it seems like the pt is feeling better.     ??? OBJECTIVE:    ??? Observation: 2/15 TPs R UT  ??? Test measurements:      RESTRICTIONS/PRECAUTIONS: Pt's daughter reports she had lower back in 2017    Exercises/Interventions:   Therapeutic Exercise (97110) Resistance / level Sets/sec Reps Notes   UBE  4 min =59F/2B      Seated:  ?? Maintained B scap retract, cervical retraction  ?? Chin tuck w/towel behind head                Back to wall   CTwith rotation B  CT with flex/ext   10x  10x supine;   Mid row at free motion 25# 1   TCs/VCs to keep UT relaxed    TB:  ?? High row  ?? D1 ext  ?? Shld add Lime green    Blue  blue TCs/VCs to keep UT relaxed    LPD TCs/VCs to keep UT relaxed    Per pt request pulley sho flex    Free weights--shld abd to 90  scap retract  Bent over ext 1 #  0#  1# '1  1  1     ' Ball on wall---rot at 90  Serratus rot  ?? Chin tuck  ?? B scap retract  ?? CW & CCW circles w/shld flexed to 90 deg      Unilateral pec stretch at [place] [phone])       2/20:  After pt & dtr request, educated pt and dtr on probable nerve impingement causing symptoms and effects of good posture on reducing neural compression and symptoms using cervical model. Both reported understanding                                   NMR re-education (31517)                                          Manual Intervention (97140)       Cerv mobs/manip  ?? P/A mobs T2, T1, C7  ?? Gentle cervical distraction  5 mins            ?? STM R cervical paraspinals, B upper trap, R SCM, R suboccipitals  ?? R upper trap stretch  ?? R scalene stretch  ?? SOR  ?? B chest stretch  4 mins      3x20"           Added UT stretch to HEP 2/18   MFR/DTM UT and Levator SCap  2/11, 2/7: supine with roll under knees gentle STM L> R UT, scalenes, suboccipitals, gentle manual cervical txn, gentle manual cervical sb/rot/flex stetches      2/8: STM B/L UT with trigger point release; manual cervical rotation and side bending stretching     2/15 IASTM R>L UT, levator, scalenes, SCM splenius cervicus and capitus                        HEP Instruction:  2/18:  Added R upper trap stretch w/R hand on edge of table as needed to HEP, distributed handout    Patient education: 2/11: handout  Lime tband 1x/day, 15x high row, lpd, mid row  -pt educated on diagnosis,  prognosis and expectations for rehab  -pt provided with written and illustrated instructions for HEP  -all pt questions were answered  2/8: Used spinal model to explain to pt and daughter radiculopathy and what could be causes of numbness in L hand     Therapeutic Exercise and NMR EXR  '[x]'  (97110) Provided verbal/tactile cueing for activities related to strengthening, flexibility, endurance, ROM  for improvements in cervical, postural, scapular, scapulothoracic and UE control with self care, reaching, carrying, lifting, house/yardwork, driving/computer work.    '[]'  [phone]) Provided verbal/tactile cueing for activities related to improving balance, coordination, kinesthetic sense, posture, motor skill, proprioception  to assist with cervical, scapular, scapulothoracic and UE control with self care, reaching, carrying, lifting, house/yardwork, driving/computer work.    Therapeutic Activities:    '[]'  [phone] or [id number]) Provided verbal/tactile cueing for activities related to improving balance, coordination, kinesthetic sense, posture, motor skill, proprioception and motor activation to allow for proper function of cervical, scapular, scapulothoracic and UE control with self care, carrying, lifting, driving/computer work.     Home Exercise Program:   '[x]'  [phone]) Reviewed/Progressed HEP activities related to strengthening, flexibility, endurance, ROM of cervical, scapular, scapulothoracic and UE control with self care, reaching, carrying, lifting, house/yardwork, driving/computer work  '[]'  [phone]) Reviewed/Progressed HEP activities related to improving balance, coordination, kinesthetic sense, posture, motor skill, proprioception of cervical, scapular, scapulothoracic and UE control with self care, reaching, carrying, lifting, house/yardwork, driving/computer work      Manual Treatments:  PROM / STM / Oscillations-Mobs:  G-I, II, III, IV (PA's, Inf., Post.)  '[x]'  (97140) Provided manual therapy to mobilize soft tissue/joints  of cervical/CT, scapular GHJ and UE for the purpose of decreasing headache, modulating pain, promoting relaxation,  increasing ROM, reducing/eliminating soft tissue swelling/inflammation/restriction, improving soft tissue extensibility and allowing for proper ROM for normal function with self care, reaching, carrying, lifting, house/yardwork, driving/computer work    Modalities:    Mechanical cervical traction, 12/18, 30/10 on/off x10 mins  -can increase pull next session per pt    2/17:  Pt said PT could increase pull. Patient was positioned supine on traction table with bolsters under bilateral knees with head/neck in traction device. Parameters were as follows: 12/18 max/min pounds with on/off ratio of 30/10, for a total of 12 minutes.Patient was given panic button as well as instructed how to use it if experiencing pain. Patient was also given bell to call if anything is needed.     07/29/18 Patient was positioned supine on traction table with bolsters under bilateral knees with head/neck in traction device. Parameters were as follows: 12/8 max/min pounds with on/off ratio of 30/10, for a total of 12 minutes.Patient was given panic button as well as instructed how to use it if experiencing pain. Patient was also  given bell to call if anything is needed.     2/15, 2/7: supine with roll under knees, CP neck/L UT 10 min,covered with warm blanket    Charges:  Timed Code Treatment Minutes: 29   Total Treatment Minutes: 29       '[]'  EVAL - LOW (97161)   '[]'  EVAL - MOD (40347)  '[]'  EVAL - HIGH (42595)  '[]'  RE-EVAL (63875)  '[x]'  IE(33295) x  1    '[]'  NMR (18841) x      '[]'  Ionto  '[x]'  Manual (97140) x  1    '[]'  Ultrasound  '[]'  TA x       '[]'  Mech Traction (66063)  '[]'  Home Management Training x   '[]'  ES (un) (97014):           '[]'  Aquatic    '[]'  ES(attended) (01601)   '[]'  Group    '[]'  Other:    GOALS:Patient stated goal: resolve pain to return to normal   '[x]'  Progressing: '[]'  Met: '[]'  Not Met: '[]'  Adjusted    Therapist goals for Patient:    Short Term Goals: To be achieved in: 2 weeks  1. Independent in HEP and progression per patient tolerance, in order to prevent re-injury.   '[x]'  Progressing: '[]'  Met: '[]'  Not Met: '[]'  Adjusted  2. Patient will have a decrease in pain to facilitate improvement in movement, function, and ADLs as indicated by Functional Deficits.  '[x]'  Progressing: '[]'  Met: '[]'  Not Met: '[]'  Adjusted    Long Term Goals: To be achieved in: 4 weeks  1. Disability index score of 20% or less for the NDI to assist with reaching prior level of function.   '[]'  Progressing: '[]'  Met: '[]'  Not Met: '[]'  Adjusted  2. Patient will demonstrate increased AROM to [facility] of cervical/thoracic spine to allow for proper joint functioning as indicated by patients Functional Deficits.   '[]'  Progressing: '[]'  Met: '[]'  Not Met: '[]'  Adjusted  3. Patient will demonstrate an increase in postural awareness and control and activation of  Deep cervical stabilizers to allow for proper functional mobility as indicated by patients Functional Deficits.   '[]'  Progressing: '[]'  Met: '[]'  Not Met: '[]'  Adjusted  4. Patient will return to functional activities including cooking, cleaning without increased symptoms or restriction.   '[]'  Progressing: '[]'  Met: '[]'  Not Met: '[]'  Adjusted  5. To be able to sleep through night without waking due to pain  '[]'  Progressing: '[]'  Met: '[]'  Not Met: '[]'  Adjusted         Overall Progression Towards Functional goals/ Treatment Progress Update:  '[]'  Patient is progressing as expected towards functional goals listed.    '[]'  Progression is slowed due to complexities/Impairments listed.  '[]'  Progression has been slowed due to co-morbidities.  '[x]'  Plan just implemented, too soon to assess goals progression <30days   '[]'  Goals require adjustment due to lack of progress  '[]'  Patient is not progressing as expected and requires additional follow up with physician  '[]'  Other    Persisting Functional Limitations/Impairments:  '[]' Sitting '[]' Standing   '[]' Walking '[]' Squatting/bending     '[]' Stairs '[x]' ADL's    '[]' Transfers '[x]' Reaching  '[x]' Housework '[x]' Lifting  '[]' Driving '[]' Job related tasks  '[]' Sports/Recreation  '[x]' Sleeping  '[]' Other:    ASSESSMENT:      Treatment/Activity Tolerance:  '[x]'  Patient able to complete tx  '[]'  Patient limited by fatique  '[]'  Patient limited by pain  '[]'  Patient limited by other medical complications  '[]'  Other:     Prognosis: '[x]'   Good '[]'  Fair  '[]'  Poor    Patient Requires Follow-up: '[x]'  Yes  '[]'  No    PLAN: See eval. PT 2x / week for 4 weeks.   '[x]'  Continue per plan of care '[]'  Alter current plan (see comments)  '[]'  Plan of care initiated '[]'  Hold pending MD visit '[]'  Discharge    Electronically signed by: Marybelle Killings, PT , DPT        Note: If patient does not return for scheduled/ recommended follow up visits, his note will serve as a discharge from care along with most recent update on progress.

## 2018-08-17 ENCOUNTER — Inpatient Hospital Stay: Admit: 2018-08-17 | Payer: MEDICAID

## 2018-08-17 NOTE — Other (Signed)
[facility] - Outpatient Physical Therapy  Phone: [phone]   Fax: [phone]    Physical Therapy Treatment Note/ Progress Report:     Date:  08/17/2018     Patient Name:  Faith AMBROCIO    DOB:  07-11-1975  MRN: [id number]  Restrictions/Precautions:    Medical/Treatment Diagnosis Information:   Diagnosis: Neck pain M54.2,  B shoudler pain M25.511 w B UE radiculopathy   Treatment diagnosis:  Increased pain and decreased scapular stability  Insurance/Certification information:     Physician Information:  Referring Practitioner: Eveline Keto, MD  Plan of care signed (Y/N): [x]   Yes []   No Cosign 2/3    Date of Patient follow up with Physician:  Pt states is to complete PT then MRI  MD appt 2/20     Progress Report: [x]   Yes  []   No     Date Range for reporting period:   Beginning: 07/24/2018  Ending:  08/14/2018  Progress report due (10 Rx/or 30 days whichever is less): 10 days     Recertification due (POC duration/ or 90 days whichever is less): n;     Visit # Insurance Allowable Auth required? Date Range   11/12 + 0/12 30 v/cy [facility] []   Yes  [x]   No      Latex Allergy:  [x] NO      [] YES  Preferred Language for Healthcare:   [] English       [x] other: Nepalese  Functional Scale: Quick Dash  Score 41,  61% disability Date assessed:07/24/2018    Pain level:  6/10 R neck/shoulder pain and tingly    SUBJECTIVE:    Pt's dtr present and translated for pt.  Pt reports pain is a little worse today, washed dishes last night and both pain and N&T were bad all night.  Has follow-up w/doctor tomorrow and plans to get a prescription for treatment of her lower back then. Has to leave on time today so would like to do mechanical traction first or in middle of session.      2/24  Pt ddtr translated for pt today,  Pt states having only n/t on R scapula , and on L lateral brachium.  L UE N/T is now intermittent and is able to perform more ADL/IADL, sleeping better as well.      2/17: daughter present for  translation;  Patient reporting right sided mid cervical pain.  Feels that traction is helping.  Also has some hand numbness and burning in her right shoulder blade that is intermittent in nature.  2/15 Pt's daughter translating for pt.  Reports she feels better than last time.  Thinks PT tx is helping. Says the HEP ex help. Requests blue tband for HEP   2/11 Pt's daughter translating for pt.  She does have some numbness and burning in a small area above the right shoulder,but it is less than it was. Pt states she felt much better after the mechanical traction. Pt also report Pt's daughter states it seems like the pt is feeling better.     . OBJECTIVE:    . Observation: 2/15 TPs R UT  . Test measurements:      RESTRICTIONS/PRECAUTIONS: Pt's daughter reports she had lower back in 2017    Exercises/Interventions:   Therapeutic Exercise (97110) Resistance / level Sets/sec Reps Notes   UBE  2 min =45F/1B     Seated:   Maintained B scap retract, cervical retraction   Chin tuck w/towel  behind head                Back to wall   CTwith rotation B  CT with flex/ext    supine;   Mid row at free motion 25# 1   TCs/VCs to keep UT relaxed    TB:   High row   D1 ext   Shld add Lime green    Blue  blue TCs/VCs to keep UT relaxed    LPD TCs/VCs to keep UT relaxed    Per pt request pulley sho flex    Free weights--shld abd to 90  scap retract  Bent over ext 1 #  0#  1# 1  1  1      Ball on wall---rot at 90  Serratus rot   Chin tuck   B scap retract   CW & CCW circles w/shld flexed to 90 deg      Unilateral pec stretch at doorway            Therapeutic Activities [phone])       2/20:  After pt & dtr request, educated pt and dtr on probable nerve impingement causing symptoms and effects of good posture on reducing neural compression and symptoms using cervical model. Both reported understanding                                   NMR re-education [phone])                                          Manual Intervention (97140)        Cerv mobs/manip   P/A mobs T2, T1, C7   Gentle cervical distraction               STM R cervical paraspinals, B upper trap,    R upper trap stretch   R levator stretch   R scalene stretch   SOR   B chest stretch  5 mins        2x20"    3x30"       Added UT stretch to HEP 2/18   MFR/DTM UT and Levator SCap      2/11, 2/7: supine with roll under knees gentle STM L> R UT, scalenes, suboccipitals, gentle manual cervical txn, gentle manual cervical sb/rot/flex stetches      2/8: STM B/L UT with trigger point release; manual cervical rotation and side bending stretching     2/15 IASTM R>L UT, levator, scalenes, SCM splenius cervicus and capitus                        HEP Instruction:  2/18:  Added R upper trap stretch w/R hand on edge of table as needed to HEP, distributed handout    Patient education: 2/11: handout  Lime tband 1x/day, 15x high row, lpd, mid row  -pt educated on diagnosis, prognosis and expectations for rehab  -pt provided with written and illustrated instructions for HEP  -all pt questions were answered  2/8: Used spinal model to explain to pt and daughter radiculopathy and what could be causes of numbness in L hand     Therapeutic Exercise and NMR EXR  [x]  (97110) Provided verbal/tactile cueing for activities related to strengthening, flexibility, endurance, ROM  for improvements in  cervical, postural, scapular, scapulothoracic and UE control with self care, reaching, carrying, lifting, house/yardwork, driving/computer work.    []  [phone]) Provided verbal/tactile cueing for activities related to improving balance, coordination, kinesthetic sense, posture, motor skill, proprioception  to assist with cervical, scapular, scapulothoracic and UE control with self care, reaching, carrying, lifting, house/yardwork, driving/computer work.    Therapeutic Activities:    []  [phone] or [phone]) Provided verbal/tactile cueing for activities related to improving balance, coordination, kinesthetic sense,  posture, motor skill, proprioception and motor activation to allow for proper function of cervical, scapular, scapulothoracic and UE control with self care, carrying, lifting, driving/computer work.     Home Exercise Program:   [x]  [phone]) Reviewed/Progressed HEP activities related to strengthening, flexibility, endurance, ROM of cervical, scapular, scapulothoracic and UE control with self care, reaching, carrying, lifting, house/yardwork, driving/computer work  []  [phone]) Reviewed/Progressed HEP activities related to improving balance, coordination, kinesthetic sense, posture, motor skill, proprioception of cervical, scapular, scapulothoracic and UE control with self care, reaching, carrying, lifting, house/yardwork, driving/computer work      Manual Treatments:  PROM / STM / Oscillations-Mobs:  G-I, II, III, IV (PA's, Inf., Post.)  [x]  (97140) Provided manual therapy to mobilize soft tissue/joints of cervical/CT, scapular GHJ and UE for the purpose of decreasing headache, modulating pain, promoting relaxation,  increasing ROM, reducing/eliminating soft tissue swelling/inflammation/restriction, improving soft tissue extensibility and allowing for proper ROM for normal function with self care, reaching, carrying, lifting, house/yardwork, driving/computer work    Modalities:    Mechanical cervical traction, 15/8, 30/10 on/off x15 mins w/bolsters under BLEs    2/17:  Pt said PT could increase pull. Patient was positioned supine on traction table with bolsters under bilateral knees with head/neck in traction device. Parameters were as follows: 12/18 max/min pounds with on/off ratio of 30/10, for a total of 12 minutes.Patient was given panic button as well as instructed how to use it if experiencing pain. Patient was also given bell to call if anything is needed.     07/29/18 Patient was positioned supine on traction table with bolsters under bilateral knees with head/neck in traction device. Parameters were as follows:  12/8 max/min pounds with on/off ratio of 30/10, for a total of 12 minutes.Patient was given panic button as well as instructed how to use it if experiencing pain. Patient was also given bell to call if anything is needed.     2/15, 2/7: supine with roll under knees, CP neck/L UT 10 min,covered with warm blanket    Charges:  Timed Code Treatment Minutes: 27   Total Treatment Minutes: 27       []  EVAL - LOW (97161)   []  EVAL - MOD (09323)  []  EVAL - HIGH (55732)  []  RE-EVAL (20254)  []  YH(06237) x      []  NMR (97112) x      []  Ionto  [x]  Manual (97140) x  1    []  Ultrasound  []  TA x       [x]  Mech Traction (62831)  []  Home Management Training x   []  ES (un) (97014):           []  Aquatic    []  ES(attended) (51761)   []  Group    []  Other:    GOALS:Patient stated goal: resolve pain to return to normal   [x]  Progressing: []  Met: []  Not Met: []  Adjusted    Therapist goals for Patient:   Short Term Goals: To be achieved in: 2 weeks  1. Independent  in HEP and progression per patient tolerance, in order to prevent re-injury.   [x]  Progressing: []  Met: []  Not Met: []  Adjusted  2. Patient will have a decrease in pain to facilitate improvement in movement, function, and ADLs as indicated by Functional Deficits.  [x]  Progressing: []  Met: []  Not Met: []  Adjusted    Long Term Goals: To be achieved in: 4 weeks  1. Disability index score of 20% or less for the NDI to assist with reaching prior level of function.   []  Progressing: []  Met: []  Not Met: []  Adjusted  2. Patient will demonstrate increased AROM to [facility] of cervical/thoracic spine to allow for proper joint functioning as indicated by patients Functional Deficits.   []  Progressing: []  Met: []  Not Met: []  Adjusted  3. Patient will demonstrate an increase in postural awareness and control and activation of  Deep cervical stabilizers to allow for proper functional mobility as indicated by patients Functional Deficits.   []  Progressing: []  Met: []  Not Met: []  Adjusted  4. Patient  will return to functional activities including cooking, cleaning without increased symptoms or restriction.   []  Progressing: []  Met: []  Not Met: []  Adjusted  5. To be able to sleep through night without waking due to pain  []  Progressing: []  Met: []  Not Met: []  Adjusted       Overall Progression Towards Functional goals/ Treatment Progress Update:  []  Patient is progressing as expected towards functional goals listed.    []  Progression is slowed due to complexities/Impairments listed.  []  Progression has been slowed due to co-morbidities.  [x]  Plan just implemented, too soon to assess goals progression <30days   []  Goals require adjustment due to lack of progress  []  Patient is not progressing as expected and requires additional follow up with physician  []  Other    Persisting Functional Limitations/Impairments:  [] Sitting [] Standing   [] Walking [] Squatting/bending    [] Stairs [x] ADL's    [] Transfers [x] Reaching  [x] Housework [x] Lifting  [] Driving [] Job related tasks  [] Sports/Recreation  [x] Sleeping  [] Other:    ASSESSMENT:      Treatment/Activity Tolerance:  [x]  Patient able to complete tx  []  Patient limited by fatique  []  Patient limited by pain  []  Patient limited by other medical complications  []  Other:     Prognosis: [x]  Good []  Fair  []  Poor    Patient Requires Follow-up: [x]  Yes  []  No    PLAN: See eval. PT 2x / week for 4 weeks.   [x]  Continue per plan of care []  Alter current plan (see comments)  []  Plan of care initiated []  Hold pending MD visit []  Discharge    Electronically signed by: Dorothe Pea, PT , DPT        Note: If patient does not return for scheduled/ recommended follow up visits, his note will serve as a discharge from care along with most recent update on progress.

## 2018-08-18 ENCOUNTER — Ambulatory Visit: Admit: 2018-08-18 | Discharge: 2018-08-18 | Payer: MEDICAID | Attending: Family Medicine

## 2018-08-18 ENCOUNTER — Inpatient Hospital Stay: Admit: 2018-08-18 | Payer: MEDICAID

## 2018-08-18 DIAGNOSIS — I10 Essential (primary) hypertension: Secondary | ICD-10-CM

## 2018-08-18 MED ORDER — LISINOPRIL-HYDROCHLOROTHIAZIDE 10-12.5 MG PO TABS
ORAL_TABLET | Freq: Every day | ORAL | 2 refills | Status: DC
Start: 2018-08-18 — End: 2018-12-15

## 2018-08-18 NOTE — Progress Notes (Signed)
Subjective:      Patient ID: Faith Mason is a 43 y.o. female.    HPI  Interpreter is present.    ZHY:QMVHQ well.  Associated w/nothing else new.  Worsened by nothing reported.  Improves w/medication:no side effects reported.  Adds salt to food at the table:No.  Denies cp/sob/pnd/ankle edema/dizziness.     Diabetes:A1c of 6.7=diabetes.  LDL=141 on 04/12/19.  Fasting-morning sugars=109-120s.  2hrs postprandial sugars=160s.  Associated w/nothing.  ACE in-hib:no.  Checks feet daily.  Eye doctor exam:<40mo per pt'.  Improving factor:better diet regime. Metformin not filled since wants to address with diet only at this time.  Worsening factor:none reported.  +fhx DM2(mother).  Denies polyuria/polyphagia/polydipsia/unexpected weight loss or gain.    GERD f/u: Taking medication without side effects. Doing well.   No new associated/worsening or other improving factors.  Denies abdo pain/n-v/diarrhea/melena-blood in stool.      F/u tIO:NGEXBMWUXshoulder pain >419monthdescribed as mild sharp pain.  Was seen ortho on 12/13:PT was advised & prescribed naproxen. No med side effects.  Has had approx 4weeks of PT that is helping. Wishes to try additional PT for this & also request it for her lower back:hx of spine .  Preceding injury:none recalled.  Associated with mild intermittent neck pain(posterior) & mild intermittent radicular pain bilateral arms.  Worsened(aggravated) by shoulder ROM & abduction. +night time shoulder pain.  Improves with naproxen/PT.  Denies BUE weakness-paralysis/skin wounds-ulcers-lesions.  Denies BLE weakness-paresthesia-paralysis/urinary or bowel control loss or change/saddle anesthesia/gait abnormality.    No Known Allergies    Current Outpatient Medications on File Prior to Visit   Medication Sig Dispense Refill   ??? blood glucose monitor strips Test 2times a day 100 strip 4   ??? glucose monitoring kit (FREESTYLE) monitoring kit 1 each by Does not apply route once for 1 dose Glucometer:QD-BID testing.  Ok to provide insurance covered device. 1 kit 0   ??? Lancets MISC BID testing 100 each 6   ??? naproxen (NAPROSYN) 500 MG tablet Take 1 tablet by mouth 2 times daily (with meals) 60 tablet 3   ??? lisinopril-hydrochlorothiazide (PRINZIDE;ZESTORETIC) 10-12.5 MG per tablet Take 1 tablet by mouth daily 30 tablet 2     No current facility-administered medications on file prior to visit.        Past Medical History:   Diagnosis Date   ??? GERD (gastroesophageal reflux disease)    ??? Hypertension 2016           Social History     Tobacco Use   ??? Smoking status: Never Smoker   ??? Smokeless tobacco: Never Used   Substance Use Topics   ??? Alcohol use: Never     Frequency: Never   ??? Drug use: Never     Social History     Substance and Sexual Activity   Drug Use Never           Review of Systems   Constitutional: Negative for activity change, appetite change, chills, diaphoresis, fatigue, fever and unexpected weight change.   Eyes: Negative for visual disturbance.   Respiratory: Negative for apnea, cough, choking, chest tightness, shortness of breath, wheezing and stridor.    Cardiovascular: Negative for chest pain, palpitations and leg swelling.   Gastrointestinal: Negative for abdominal distention, abdominal pain, anal bleeding, blood in stool, constipation, diarrhea, nausea, rectal pain and vomiting.   Endocrine: Negative for cold intolerance, heat intolerance, polydipsia, polyphagia and polyuria.   Genitourinary: Negative for difficulty urinating.   Musculoskeletal: Positive  for arthralgias. Negative for back pain and gait problem.   Skin: Negative for rash and wound.   Neurological: Negative.  Negative for dizziness, light-headedness and headaches.   Hematological: Negative for adenopathy.   Psychiatric/Behavioral: Negative for sleep disturbance.       Objective:   Physical Exam  Constitutional:       General: She is not in acute distress.     Appearance: Normal appearance. She is well-developed.   HENT:      Nose: Nose normal.       Mouth/Throat:      Pharynx: Uvula midline.   Eyes:      General: Lids are normal.      Conjunctiva/sclera: Conjunctivae normal.      Pupils: Pupils are equal, round, and reactive to light.   Neck:      Musculoskeletal: Normal range of motion and neck supple.      Vascular: No carotid bruit or JVD.      Trachea: Trachea normal.   Cardiovascular:      Rate and Rhythm: Normal rate and regular rhythm.      Pulses: Normal pulses.      Heart sounds: Normal heart sounds.   Pulmonary:      Effort: Pulmonary effort is normal.      Breath sounds: Normal breath sounds.   Abdominal:      General: Bowel sounds are normal. There is no distension.      Palpations: Abdomen is soft. There is no hepatomegaly, splenomegaly or mass.      Tenderness: There is no abdominal tenderness. There is no right CVA tenderness, left CVA tenderness, guarding or rebound.   Musculoskeletal:      Right hip: Normal.      Left hip: Normal.      Cervical back: She exhibits normal range of motion, no tenderness, no bony tenderness, no swelling, no edema, no deformity, no laceration, no pain, no spasm and normal pulse.      Thoracic back: Normal.      Lumbar back: She exhibits normal range of motion, no tenderness, no bony tenderness, no swelling, no edema, no deformity, no laceration, no pain, no spasm and normal pulse.        Back:         Arms:       Comments: X shows area of neck pain & back pain:No TTP.  Negative SLR bilaterally.  Improving [facility] exam since prior visit on 06/16/18.  No signs/sx/exam findings concerning for cauda equina syndrome.     Lymphadenopathy:      Cervical: No cervical adenopathy.   Skin:     General: Skin is warm and dry.      Capillary Refill: Capillary refill takes less than 2 seconds.      Comments: Good skin turgor.    Neurological:      Mental Status: She is alert and oriented to person, place, and time.   Psychiatric:         Behavior: Behavior is cooperative.         Assessment:        Diagnosis Orders   1. Essential  hypertension  VSS/well appearing.  Stable.    lisinopril-hydroCHLOROthiazide (PRINZIDE;ZESTORETIC) 10-12.5 MG per tablet   2. Controlled type 2 diabetes mellitus without complication, without long-term current use of insulin ([place])  Stable/diet managed.    A1c due.  Diabetes:Check feet daily. Yrly eye checks advised.  Education: Reviewed ???ABCs??? of diabetes management (respective goals  in parentheses):?? A1C (<7), blood pressure (<130/80), and cholesterol (LDL <100).  Counseled at this visit on the following: diabetes complication prevention, foot care.     3. Gastroesophageal reflux disease without esophagitis  Stable.     4. Acute pain of both shoulders  Improving shoulder-neck pain:continue PT & f/u with ortho.  [place]   5. Chronic bilateral low back pain without sciatica  Start PT;consult ortho & check xray.  NSAIDs YIF:OYDXA term use to avoid kidney injury.  [facility] - [facility]    XR LUMBAR SPINE (2-3 VIEWS)   6. Neck pain  See note#4.  [place]   7. Radicular pain of left upper extremity  See note#4.  [place]   8. Mixed hyperlipidemia  Stable.  Labs due.     9. Adrenal mass, left [facility])  Per specialist.           Plan:      Pt' left office in good condition.    Obtain labs/diagnostic tests as discussed today & call back for results within 2-7days.  Call or return to clinic prn if these symptoms worsen or fail to improve as anticipated.         [name], MD

## 2018-08-18 NOTE — Patient Instructions (Signed)
Patient Education        ?????? ?????????? ?????????????????? ?????? ????? - [ Learning About Diabetes Food Guidelines ]  ??????? ??????? ???????????    ?????? ????????? ???? ?????? ????? ????? ???????????? ?????? ???? ??????? ???? ??????? ?????? ?????? ????? ????? ???? (??????? ????? ????????? ??? ?????????)? ??????? ??????? ??????? ??? ?????? ??????? ?????????? ???????? ???? ????? ???????? ???? ???????? ??? ??????????? ?? ??????? ??? ??? ????????? ? ??????? ????? ??????? ??? ????????? ??? ??????? ????? ?????????  ??????? ????? ???? ??? ???????????? ????? ??????? ????? ????? ???? ???????? ?? ???????? ?????? ?????? (CDE) ??? ??? ???? ????????? ??? ????? ??? ???? ??????? ????????? ???? ?? ?? ??? ???? ???????? ?? CDE ?? ??? ???????? ??? ??????? ????? ???? ???????????  ????????????????? ???? ???? ??????? ??????????? ??????? ??-?? ?????  ???????? ?????? ????? ??????? ???? ?????????????? (carbs) ?? ????????? ?????????? ????? ?????? ????????? ????????? ???????????? ??? ??? ?????????????? ??????? ????????????? ?????????  ?? ??? ????????????? ????????????????? ????? ??????????? ? ??? ????????????? ??????????????????? ????????? ???????????  ? ?????, ????, ?????? ? ?????? ???????? ???? 15 ????? ????????????????? ???????? ???? ???????? 1 ?????? ????? (1 ?????), ?? ?? ??????? ???? ?? 1/3 ?? ??????? ?????? ?? ???? ????????  ? ?????????? ???????? 15 ????? ????????????????? ???????? ???? ???????? 1 ???? ???? ????? ????? ????? ?? ??????? ?????; ?? ??????; ?? ??????? ?? ??????? ??????? ?????; ?? ?? ??????? ??; 1 ?? ?????? ?? ????????; ?? 2 ????????? ???????? ??????  ? ??? ? ???? ??????? ????? ???????? 15 ????? ????????????????? ?????? ???? ???????? 1 ?? ??? ?? 2/3 ?? ???? ??????? ??? ??????  ? ???????????? ????????????? ???????? 15 ????? ????????????????? ?????? ???? ???????? ?? ?? ?????? ?????? ?? ?????? ??? ?????? 1 ?? ?????? ???????; ?? ?? ???? ??? ?????? ???; ?? ?? ??????? ???????; ?? ?? ?? ??????? ??? ?? ????? ???????  ?? ????????  ????? ???????? ?????? ??? ???????? ????????????????? ???????? ??? ??????????? ???? ???????? ?? CDE ?? ???????? ??????? ???????? ??????????????????? ????????? ???? ????? ?????? ??? ?????? ??????????? ????? ?????????????? ?????? ???????  ?? ??? ????? ???????????????? ???????? ???? ???? ????? ??? ??????? ????????? ???, ????????? ????? ??????? ???? ??????? ???? ?????? ?????????? ???? ???? ??????? ???????? ???? ???????? ? ??? ??????? ?????? ???? ?????? ? ????? ???? ??? ???? ???????? ?????? ????????????????? ???????? ???? ??? ????? ??????  ? ????????????? ???????????? ????? ????? ?????? ?????????? ??????? ??? ????? ??????? ????? ???????????, ??????? ??-?????? ????? ???? ?? ???? ??????? ???? ???????? ? ??????? ?????? ????????? ????? ????? ?? ???????????? ???????? ??????????? ???? ?????? ???? ??????? ?????? ??? ???????? ????????????????? ??? ?????????? ????? ?????? ?????? ???? ??????? ???? ?????? ? 1 ?? ??? ?? ??? ???? ???????????  ?? ???????? ?????? ???? ???????? ????????????????? ???? ?????? ?????????? ???? ???? ????? ???? ??????? ????? ????? ???????????????????? "???" ???????????  ?? ???????????? ????? ???????? ???? ???? ?? ???? ??? ?????????????? ??????? ???????????? ??????????? ???, ????, ?????, ????, ????????, ????, ???, ???? ??? ? ?????? ??? ?????? ?????? ???? ???????? 3 ????? ????? ??? ???? ?????????? ????? ???? ????? ?????? ?????? ??????????? ???????? ????????? (1 ????? ???? ???????) ??????????? 1/4 ?? ???? ???, 1 ?????, 1 ????????? ?????? ??? ? ?? ???? ??????  ??????? ???? ????? ????????? ? ???????? ???????? ?????????????  ?? ??????????????? ???? ?????? ??????? ?????? ?????? ???? ??????????? ??? ??????? ???? ???? ???? ?????????? ???, ????????????? ?????? ??????? ?????? ?????? ???? ??? ??????  ?? ??? ??????? ????? ????????? ?????? ?????? ?????????????? ???? (????? ???, ???, ?? ??????? ????), ?????? ??????? ?????-?????????????? ???? ???? ??????????? ???? ?? ????? ?????? ???????????  ?? ??????? ?????????? ?????? ??????????  ??? ???????? ??? ???? ???????? ??? ????? ????? ????? ?????? ???? ????? ????????? ?? ?????? ????? ???? ???? ???? ??????????  ?? ??????? ???????? ?????? ????? ????? ?????????????? ??? ?????????????? ????????, ??????????? ?? ???? ??????? ?????????? ???? ??????? ???? ???? ?? ???? ????? ???????  ???????? ??? ?? ???? ???? ?????  ?? ??????? ????, ????? ???? ???? ????? ?????????? ????? ???? ?????????? ?? ???? ?????? ???? ?? ????? ?????? ???? ???????? ???? ????? ???? ??????? ???? ??????? ?????? ????? ?????? ? ???? ?????? ???? ??-????? ????? ???????????? ??? ?????????? ?????? ???? ?????? ?? ??????? ????? ???? ???? ???????? ??? ?????? ??????????  ?? ???? ??? ??????????? ??????? ???? ????????? ? ???????? ???? ???????? ???? ???? ??????? ??????? ??? ??????? ????? ?????? ???? ???? ?? ????? ?????  ?? ??????? ???? ???? ??? ????? ???????? ???? ??????????? ?????? ??????? ????? ????????? ????? ???? ?? ?????? ?? ??????? ???????? ???? ???? ??????? ??????? ??? ?????? ??????? ??????????? ??? ????? ?????  ???-?? ?????? ??????? ????? ??? ????????? ????? ??? ??? ??? ????????????? ??? ??? ????? ??????? ????????? ???? ??? ???????? ????????? ??????? ? ??? ????? ????????? ?? ?????????? ????? ?????? ????????? ???? ????? ??? ????? ???? ????????? ???? ?????? ??? ?? ?????? ????? ???  ??????? ?? ???? ????? ???????????  ???? ????????  [email or web address]  ???????? ????????? I147 ?????? ?? ????? ??? ?????? "?????? ?????????? ?????????????????? ?????? ????? - [ Learning About Diabetes Food Guidelines ]."  ?? ????? ???: ?????? 16, 2019  ????????? ?????: 12.3  ?? 2006-2019 [employer]. ???????? ?????????????? ?????? ???????? ???????? ??????? ????????? ?????? ????? ??????? ??? ???????? ?? ???????? ?????? ?? ?? ?????????? ?????? ????????? ??? ???, ???? ????? ????????? ?????? ????? ????????? ??????????? [employer]wise[employer] Inc[employer], ?? ??????? ?? ????????? ???????? ???? ???? ??? ???????? ?? ?????????? ???????? ??????

## 2018-08-18 NOTE — Other (Signed)
[facility]  Phone: [phone]   Fax: [phone]    Physical Therapy Treatment Note/ Progress Report:     Date:  08/18/2018     Patient Name:  Faith Mason    DOB:  [birth year]  MRN: [id number]  Restrictions/Precautions:    Medical/Treatment Diagnosis Information:  ?? Diagnosis: Neck pain M54.2,  B shoudler pain M25.511 w B UE radiculopathy  ?? Treatment diagnosis:  Increased pain and decreased scapular stability  Insurance/Certification information:     Physician Information:  Referring Practitioner: [name], MD  Plan of care signed (Y/N): '[x]'$   Yes '[]'$   No Cosign 2/3    Date of Patient follow up with Physician:  Pt states is to complete PT then MRI  MD appt 2/20     Progress Report: '[x]'$   Yes  '[]'$   No     Date Range for reporting period:   Beginning: 07/24/2018  Ending:  08/14/2018  Progress report due (10 Rx/or 30 days whichever is less): 10 days     Recertification due (POC duration/ or 90 days whichever is less): n;     Visit # Insurance Allowable Auth required? Date Range   11/12 + 0/12 30 v/cy [facility] '[]'$   Yes  '[x]'$   No      Latex Allergy:  '[x]'$ NO      '[]'$ YES  Preferred Language for Healthcare:   '[]'$ English       '[x]'$ other: Nepalese  Functional Scale: Quick Dash  Score 41,  61% disability Date assessed:07/24/2018    Pain level:  6/10 L neck/shoulder pain. No pain on R side of neck but some N/T at superior aspect of R scap       SUBJECTIVE:   2/28 Pt's dtr present and translated for pt.  Pt reports PT cont to help - is able to sleep better than previously    2/27 Pt's dtr present and translated for pt.  Pt reports pain is a little worse today, washed dishes last night and both pain and N&T were bad all night.  Has follow-up w/doctor tomorrow and plans to get a prescription for treatment of her lower back then. Has to leave on time today so would like to do mechanical traction first or in middle of session.      2/24  Pt ddtr translated for pt today,  Pt states  having only n/t on R scapula , and on L lateral brachium.  L UE N/T is now intermittent and is able to perform more ADL/IADL, sleeping better as well.      2/17: daughter present for translation;  Patient reporting right sided mid cervical pain.  Feels that traction is helping.  Also has some hand numbness and burning in her right shoulder blade that is intermittent in nature.  2/15 Pt's daughter translating for pt.  Reports she feels better than last time.  Thinks PT tx is helping. Says the HEP ex help. Requests blue tband for HEP   2/11 Pt's daughter translating for pt.  She does have some numbness and burning in a small area above the right shoulder,but it is less than it was. Pt states she felt much better after the mechanical traction. Pt also report Pt's daughter states it seems like the pt is feeling better.     ??? OBJECTIVE:    ??? Observation: 2/15 TPs R UT  ??? Test measurements:      RESTRICTIONS/PRECAUTIONS: Pt's daughter reports she had lower  back in 2017    Exercises/Interventions:   Therapeutic Exercise (97110) Resistance / level Sets/sec Reps Notes   UBE  4 min =60F/2B     Back to wall, towel roll behind neck:  cerv retraction  B scap retract,  B ER with orange tband  B horiz AB with orange tband    ?? Chin tuck w/towel behind head       10x  10x  10x  10x           Back to wall   CTwith rotation B  CT with flex/ext    supine;   Mid row at free motion 25# 1   TCs/VCs to keep UT relaxed    TB:  ?? High row  ?? D1 ext  ?? Shld add Lime green    Blue  blue TCs/VCs to keep UT relaxed    LPD TCs/VCs to keep UT relaxed    Per pt request pulley sho flex    Free weights--shld abd to 90  scap retract  Bent over ext 1 #  0#  1# '1  1  1     '$ Ball on wall---rot at 61      ?? CW & CCW circles w/shld flexed to 90 deg  X        15 ea B         Gold ball med ball   Unilateral pec stretch at doorway  30 sec 2           Therapeutic Activities [phone])       2/20:  After pt & dtr request, educated pt and dtr on probable nerve  impingement causing symptoms and effects of good posture on reducing neural compression and symptoms using cervical model. Both reported understanding                                   NMR re-education [phone])       Postural ed re importance of good upright sitting posture to reduce pain and nerve irritation. Pt instructed iin upright posture for sit and stand and use of lumbar roll  10'                                 Manual Intervention [phone])       Cerv mobs/manip  ?? P/A mobs T2, T1, C7  ?? Gentle cervical distraction       Manual  cervical distraction & SOR  x8 min  Pt requests manual cerv distraction since she had mech txn yesterday      ?? STM R cervical paraspinals, B upper trap,   ?? R upper trap stretch  ?? R levator stretch  ?? R scalene stretch  ?? SOR  ?? B chest stretch  "       Added UT stretch to HEP 2/18   MFR/DTM UT and Levator SCap      2/11, 2/7: supine with roll under knees gentle STM L> R UT, scalenes, suboccipitals, gentle manual cervical txn, gentle manual cervical sb/rot/flex stetches      2/8: STM B/L UT with trigger point release; manual cervical rotation and side bending stretching     2/15 IASTM R>L UT, levator, scalenes, SCM splenius cervicus and capitus  HEP Instruction:  2/28 upright posture & use lumbar roll  2/18:  Added R upper trap stretch w/R hand on edge of table as needed to HEP, distributed handout    Patient education: 2/11: handout  Lime tband 1x/day, 15x high row, lpd, mid row  -pt educated on diagnosis, prognosis and expectations for rehab  -pt provided with written and illustrated instructions for HEP  -all pt questions were answered  2/8: Used spinal model to explain to pt and daughter radiculopathy and what could be causes of numbness in L hand     Therapeutic Exercise and NMR EXR  '[x]'$  (97110) Provided verbal/tactile cueing for activities related to strengthening, flexibility, endurance, ROM  for improvements in cervical, postural, scapular,  scapulothoracic and UE control with self care, reaching, carrying, lifting, house/yardwork, driving/computer work.    '[]'$  [phone]) Provided verbal/tactile cueing for activities related to improving balance, coordination, kinesthetic sense, posture, motor skill, proprioception  to assist with cervical, scapular, scapulothoracic and UE control with self care, reaching, carrying, lifting, house/yardwork, driving/computer work.    Therapeutic Activities:    '[]'$  [phone] or [id number]) Provided verbal/tactile cueing for activities related to improving balance, coordination, kinesthetic sense, posture, motor skill, proprioception and motor activation to allow for proper function of cervical, scapular, scapulothoracic and UE control with self care, carrying, lifting, driving/computer work.     Home Exercise Program:   '[x]'$  [phone]) Reviewed/Progressed HEP activities related to strengthening, flexibility, endurance, ROM of cervical, scapular, scapulothoracic and UE control with self care, reaching, carrying, lifting, house/yardwork, driving/computer work  '[]'$  (30865) Reviewed/Progressed HEP activities related to improving balance, coordination, kinesthetic sense, posture, motor skill, proprioception of cervical, scapular, scapulothoracic and UE control with self care, reaching, carrying, lifting, house/yardwork, driving/computer work      Manual Treatments:  PROM / STM / Oscillations-Mobs:  G-I, II, III, IV (PA's, Inf., Post.)  '[x]'$  (97140) Provided manual therapy to mobilize soft tissue/joints of cervical/CT, scapular GHJ and UE for the purpose of decreasing headache, modulating pain, promoting relaxation,  increasing ROM, reducing/eliminating soft tissue swelling/inflammation/restriction, improving soft tissue extensibility and allowing for proper ROM for normal function with self care, reaching, carrying, lifting, house/yardwork, driving/computer work    Modalities:   2/28 MHP x 10 min after tx insupine  2/27 Mechanical cervical  traction, 15/8, 30/10 on/off x15 mins w/bolsters under BLEs    2/17:  Pt said PT could increase pull. Patient was positioned supine on traction table with bolsters under bilateral knees with head/neck in traction device. Parameters were as follows: 12/18 max/min pounds with on/off ratio of 30/10, for a total of 12 minutes.Patient was given panic button as well as instructed how to use it if experiencing pain. Patient was also given bell to call if anything is needed.     07/29/18 Patient was positioned supine on traction table with bolsters under bilateral knees with head/neck in traction device. Parameters were as follows: 12/8 max/min pounds with on/off ratio of 30/10, for a total of 12 minutes.Patient was given panic button as well as instructed how to use it if experiencing pain. Patient was also given bell to call if anything is needed.     2/15, 2/7: supine with roll under knees, CP neck/L UT 10 min,covered with warm blanket    Charges:  Timed Code Treatment Minutes: 31   Total Treatment Minutes: 41       '[]'$  EVAL - LOW (97161)   '[]'$  EVAL - MOD (78469)  '[]'$  EVAL - HIGH (62952)  '[]'$   RE-EVAL (35573)  '[x]'$  UK(02542) x 1     '[]'$  NMR (70623) x      '[]'$  Ionto  '[x]'$  Manual (97140) x  1    '[]'$  Ultrasound  '[]'$  TA x       '[x]'$  Mech Traction (76283)  '[]'$  Home Management Training x   '[]'$  ES (un) (97014):           '[]'$  Aquatic    '[]'$  ES(attended) (15176)   '[]'$  Group    '[]'$  Other:    GOALS:Patient stated goal: resolve pain to return to normal   '[x]'$  Progressing: '[]'$  Met: '[]'$  Not Met: '[]'$  Adjusted    Therapist goals for Patient:   Short Term Goals: To be achieved in: 2 weeks  1. Independent in HEP and progression per patient tolerance, in order to prevent re-injury.   '[x]'$  Progressing: '[]'$  Met: '[]'$  Not Met: '[]'$  Adjusted  2. Patient will have a decrease in pain to facilitate improvement in movement, function, and ADLs as indicated by Functional Deficits.  '[x]'$  Progressing: '[]'$  Met: '[]'$  Not Met: '[]'$  Adjusted    Long Term Goals: To be achieved in: 4  weeks  1. Disability index score of 20% or less for the NDI to assist with reaching prior level of function.   '[]'$  Progressing: '[]'$  Met: '[]'$  Not Met: '[]'$  Adjusted  2. Patient will demonstrate increased AROM to [facility] of cervical/thoracic spine to allow for proper joint functioning as indicated by patients Functional Deficits.   '[]'$  Progressing: '[]'$  Met: '[]'$  Not Met: '[]'$  Adjusted  3. Patient will demonstrate an increase in postural awareness and control and activation of  Deep cervical stabilizers to allow for proper functional mobility as indicated by patients Functional Deficits.   '[]'$  Progressing: '[]'$  Met: '[]'$  Not Met: '[]'$  Adjusted  4. Patient will return to functional activities including cooking, cleaning without increased symptoms or restriction.   '[]'$  Progressing: '[]'$  Met: '[]'$  Not Met: '[]'$  Adjusted  5. To be able to sleep through night without waking due to pain  '[]'$  Progressing: '[]'$  Met: '[]'$  Not Met: '[]'$  Adjusted       Overall Progression Towards Functional goals/ Treatment Progress Update:  '[]'$  Patient is progressing as expected towards functional goals listed.    '[]'$  Progression is slowed due to complexities/Impairments listed.  '[]'$  Progression has been slowed due to co-morbidities.  '[x]'$  Plan just implemented, too soon to assess goals progression <30days   '[]'$  Goals require adjustment due to lack of progress  '[]'$  Patient is not progressing as expected and requires additional follow up with physician  '[]'$  Other    Persisting Functional Limitations/Impairments:  '[]'$ Sitting '[]'$ Standing   '[]'$ Walking '[]'$ Squatting/bending    '[]'$ Stairs '[x]'$ ADL's    '[]'$ Transfers '[x]'$ Reaching  '[x]'$ Housework '[x]'$ Lifting  '[]'$ Driving '[]'$ Job related tasks  '[]'$ Sports/Recreation  '[x]'$ Sleeping  '[]'$ Other:    ASSESSMENT:      Treatment/Activity Tolerance:  '[x]'$  Patient able to complete tx  '[]'$  Patient limited by fatique  '[]'$  Patient limited by pain  '[]'$  Patient limited by other medical complications  '[]'$  Other:     Prognosis: '[x]'$  Good '[]'$  Fair  '[]'$  Poor    Patient Requires Follow-up: '[x]'$   Yes  '[]'$  No    PLAN: See eval. PT 2x / week for 4 weeks.   '[x]'$  Continue per plan of care '[]'$  Alter current plan (see comments)  '[]'$  Plan of care initiated '[]'$  Hold pending MD visit '[]'$  Discharge    Electronically signed by: Garner Nash, PT , DPT  Note: If patient does not return for scheduled/ recommended follow up visits, his note will serve as a discharge from care along with most recent update on progress.

## 2018-08-19 LAB — COMPREHENSIVE METABOLIC PANEL
ALT: 16 U/L (ref 10–40)
AST: 19 U/L (ref 15–37)
Albumin/Globulin Ratio: 1.4 (ref 1.1–2.2)
Albumin: 4.2 g/dL (ref 3.4–5.0)
Alkaline Phosphatase: 103 U/L (ref 40–129)
Anion Gap: 14 (ref 3–16)
BUN: 10 mg/dL (ref 7–20)
CO2: 27 mmol/L (ref 21–32)
Calcium: 9.5 mg/dL (ref 8.3–10.6)
Chloride: 98 mmol/L — ABNORMAL LOW (ref 99–110)
Creatinine: 0.7 mg/dL (ref 0.6–1.1)
GFR African American: >60 (ref >60)
GFR Non-African American: >60 (ref >60)
Globulin: 3 g/dL
Glucose: 98 mg/dL (ref 70–99)
Potassium: 4 mmol/L (ref 3.5–5.1)
Sodium: 139 mmol/L (ref 136–145)
Total Bilirubin: 0.4 mg/dL (ref 0.0–1.0)
Total Protein: 7.2 g/dL (ref 6.4–8.2)

## 2018-08-19 LAB — LIPID PANEL
Cholesterol, Total: 181 mg/dL (ref 0–199)
HDL: 44 mg/dL (ref 40–60)
LDL Calculated: 109 mg/dL — ABNORMAL HIGH (ref <100)
Triglycerides: 139 mg/dL (ref 0–150)
VLDL Cholesterol Calculated: 28 mg/dL

## 2018-08-19 LAB — HEMOGLOBIN A1C
Hemoglobin A1C: 6.3 %
eAG: 134.1 mg/dL

## 2018-08-19 LAB — MICROALBUMIN / CREATININE URINE RATIO
Creatinine, Ur: 224.1 mg/dL (ref 28.0–259.0)
Microalbumin, Random Urine: <1.2 mg/dL (ref <2.0)

## 2018-08-26 ENCOUNTER — Inpatient Hospital Stay: Admit: 2018-08-26 | Payer: MEDICAID

## 2018-08-26 DIAGNOSIS — M25511 Pain in right shoulder: Secondary | ICD-10-CM

## 2018-08-26 NOTE — Other (Addendum)
[facility]  Phone: [phone]   Fax: [phone]     Physical Therapy Progress Note: Lower Back Pain    Dear Faith Haring, MD,    We had the pleasure of treating the following patient for physical therapy services at [facility].  A summary of our findings can be found in the updated assessment below.  This includes our plan of care.  If you have any questions or concerns regarding these findings, please do not hesitate to contact me at the office phone number checked above.  Thank you for the referral.     Physician Signature:________________________________Date:__________________  By signing above (or electronic signature), therapist???s plan is approved by physician      Functional Outcome:  Oswestry raw score= 29;  %DI=  64.44%    Palpation: TTP B greater trochanter & hip abd's, B IT-Band, B QL    Posture: extended lumbar spine in sitting      ROM  Comments   Lumbar Flex     Lumbar Ext       ROM LEFT RIGHT Comments   Lumbar Side Bend      Lumbar Rotation      Hip Flexion WFL WFL    Hip Abd      Hip ER      Hip IR      Hip Extension limited limited    Knee Ext WNL WNL    Knee Flex      Hamstring Flex [facility] WFL    Piriformis [facility] WFL                    Strength / Myotomes LEFT RIGHT Comments   Multifidus      Transverse Ab   2/10   Hip Flexors (L1-2) 4- 4    Hip Abductors 3+ 3+    Hip Extensors 3+ 3+    Quads (L2-4) 4+ 4+    Hamstrings  4 4+    Ankle Plantarflexion (S1-2)      Ankle Dorsiflexion (L4-5) 5 5      TA Muscle Contraction Scale    Criteria                                               Score  Quality of Contraction   Not Present      '[]'  0   Rapid, Superificial     '[x]'  1   Just Perceptible     '[]'  2     Gentle, Slow      '[]'  3    Substitution   Resting       '[x]'  0   Moderate to Strong     '[]'  1    Subtle Perceptible     '[]'  2   None       '[]'  3    Symmetry of Contraction   Unilateral       '[]'  0   Bilateral/Asymmetrical     '[x]'   1   Symmetrical       '[]'  2    Breathing     Inability/Difficulty Breathing during contraction '[x]'  0   Able to hold contraction while Breathing  '[]'  1    Holding   Able to Hold Contraction <10 s   '[x]'  0   Able to Hold Contraction >  10 s   '[]'  1      _2_/10  Adapted from Jonah Blue al, Copyright 2009        Neural dynamic tension testing Normal Abnormal Comments   Slump Test  - Degree of knee flexion:    x     SLR       0-30 x     30-70 x     Femoral nerve (L2-4)            Overall Response to Treatment:   '[x]' Patient is responding well to treatment and improvement is noted with regards  to goals   '[]' Patient should continue to improve in reasonable time if they continue HEP   '[]' Patient has plateaued and is no longer responding to skilled PT intervention    '[]' Patient is getting worse and would benefit from return to referring MD   '[]' Patient unable to adhere to initial POC   '[]' Other:     Assessment:  In addition to cervical spine and LUE complaints, pt complains of B lateral hip & thigh pain and LLE N&T that increases with activity and limits IADLs.  Patient presents w/decreased strength of core and BLEs increasing stress & strain to BLE musculature resulting in LE pain.  Patient can benefit from skilled PT to address strength deficits and return pt to PLOF.    Date range of Visits:  2/3 to 08/26/2018  Total Visits: 12/12;   1st visit for assessment of lower back & LLE symptoms    Recommendation:    '[x]' Continue PT 3x / wk for 3 weeks for treatment of cervical & LUE symptoms in addition to lower back & LLE symptoms   '[]' Hold PT, pending MD visit          Physical Therapy Treatment Note/ Progress Report:     Date:  08/26/2018     Patient Name:  [facility]    DOB:  10/25/1975  MRN: [id number]  Restrictions/Precautions:    Medical/Treatment Diagnosis Information:  ?? Diagnosis: Neck pain M54.2,  B shoudler pain M25.511 w B UE radiculopathy;    M54.5, G89.29 (ICD-10-CM) - Chronic bilateral low back pain without sciatica  ?? Treatment  diagnosis:  Increased pain and decreased scapular stability  Insurance/Certification information:     Physician Information:  Referring Practitioner: Faith Haring, MD  Plan of care signed (Y/N): '[x]'   Yes '[]'   No Cosign 2/3    Date of Patient follow up with Physician:  Pt states is to complete PT then MRI  MD appt 2/20     Progress Report: '[x]'   Yes  '[]'   No     Date Range for reporting period:   Beginning: 07/24/2018  Ending:  08/14/2018  Progress report due (10 Rx/or 30 days whichever is less): 10 days     Recertification due (POC duration/ or 90 days whichever is less): n;     Visit # Insurance Allowable Auth required? Date Range   12/12 + 0/12 30 v/[facility] '[]'   Yes  '[x]'   No      Latex Allergy:  '[x]' NO      '[]' YES  Preferred Language for Healthcare:   '[]' English       '[x]' other: Nepalese  Functional Scale: Quick Dash  Score 41,  61% disability Date assessed:07/24/2018    Pain level:  6/10 L shoulder;   4/10 B lateral hip pain, LLE N&T    Dtr translating for patient    SUBJECTIVE:   Pt reports  obtaining new order for treatment of lower back pain.  Reports lower back surgery due to RLE N&T in 2016, isn't sure what surgery it was but description may be laminectomy.  States that after surgery RLE symptoms improved but LLE slowly developed and now has LLE N&T into L foot in addition to B side/hip pain.  LLE pain & symptoms limited walking and ability to perform steps.  Lifting is extremely hard as well.  Had injection prior to surgery which did help with symptoms but returned within about a weeks time.      Having a little more pain in back of shoulder today, feels that it may be due to increased activity yesterday.        2/28 Pt's dtr present and translated for pt.  Pt reports PT cont to help - is able to sleep better than previously  2/27 Pt's dtr present and translated for pt.  Pt reports pain is a little worse today, washed dishes last night and both pain and N&T were bad all night.  Has follow-up w/doctor  tomorrow and plans to get a prescription for treatment of her lower back then. Has to leave on time today so would like to do mechanical traction first or in middle of session.    2/24  Pt ddtr translated for pt today,  Pt states having only n/t on R scapula , and on L lateral brachium.  L UE N/T is now intermittent and is able to perform more ADL/IADL, sleeping better as well.    2/17: daughter present for translation;  Patient reporting right sided mid cervical pain.  Feels that traction is helping.  Also has some hand numbness and burning in her right shoulder blade that is intermittent in nature.  2/15 Pt's daughter translating for pt.  Reports she feels better than last time.  Thinks PT tx is helping. Says the HEP ex help. Requests blue tband for HEP   2/11 Pt's daughter translating for pt.  She does have some numbness and burning in a small area above the right shoulder,but it is less than it was. Pt states she felt much better after the mechanical traction. Pt also report Pt's daughter states it seems like the pt is feeling better.     ??? OBJECTIVE:    ??? Observation: 2/15 TPs R UT  ??? Test measurements:      RESTRICTIONS/PRECAUTIONS: Pt's daughter reports she had lower back in 2017    Exercises/Interventions:   Therapeutic Exercise (97110) Resistance / level Sets/sec Reps Notes   UBE       Back to wall, towel roll behind neck:  cerv retraction  B scap retract,  B ER with orange tband  B horiz AB with orange tband    ?? Chin tuck w/towel behind head                  Back to wall   CTwith rotation B  CT with flex/ext    supine;   Mid row at free motion 25# 1   TCs/VCs to keep UT relaxed    TB:  ?? High row  ?? D1 ext  ?? Shld add Lime green    Blue  blue TCs/VCs to keep UT relaxed    LPD TCs/VCs to keep UT relaxed    Per pt request pulley sho flex    Free weights--shld abd to 90  scap retract  Bent over ext 1 #  0#  1# 1  1  1     Ball on wall---rot at 67      ?? [place] circles w/shld flexed to 90 deg  X                  Gold ball med ball   Unilateral pec stretch at doorway     Mat Table:  Bridge  SLR  Prone hip ext  S/L hip abd     10  10 B  5 B  5 B   Added to HEP 3/7  Added to HEP 3/7  Added to HEP 3/7  Added to HEP 3/7   Therapeutic Activities (16109)       2/20:  After pt & dtr request, educated pt and dtr on probable nerve impingement causing symptoms and effects of good posture on reducing neural compression and symptoms using cervical model. Both reported understanding                                   NMR re-education [phone])       Postural ed re importance of good upright sitting posture to reduce pain and nerve irritation. Pt instructed iin upright posture for sit and stand and use of lumbar roll                                   Manual Intervention (97140)       Cerv mobs/manip  ?? P/A mobs T2, T1, C7  ?? Gentle cervical distraction       Manual  cervical distraction & SOR  x3 min     ?? STM R cervical paraspinals, B upper trap,   ?? R upper trap stretch  ?? R levator stretch  ?? R scalene stretch  ?? SOR  ?? B chest stretch  5 mins2x20""       Added UT stretch to HEP 2/18   MFR/DTM UT and Levator SCap      2/11, 2/7: supine with roll under knees gentle STM L> R UT, scalenes, suboccipitals, gentle manual cervical txn, gentle manual cervical sb/rot/flex stetches      2/8: STM B/L UT with trigger point release; manual cervical rotation and side bending stretching     2/15 IASTM R>L UT, levator, scalenes, SCM splenius cervicus and capitus                        HEP Instruction:    3/7:  Access Code: [email or web address]   URL: [email or web address]   Date: 08/26/2018   Prepared by: Gunnar Fusi     Exercises   Supine Straight Leg Raises - 10 reps - 1 sets - 2x daily - 7x weekly   Sidelying Hip Abduction - 10 reps - 1 sets - 2x daily - 7x weekly   Prone Hip Extension - 10 reps - 1 sets - 2x daily - 7x weekly   Supine Bridge - 10 reps - 1 sets - 2x daily - 7x weekly     2/28 upright posture & use lumbar roll  2/18:  Added R  upper trap stretch w/R hand on edge of table as needed to HEP, distributed handout    Patient education: 2/11: handout  Lime tband 1x/day, 15x high row, lpd, mid row  -pt educated on diagnosis, prognosis and expectations for rehab  -  pt provided with written and illustrated instructions for HEP  -all pt questions were answered  2/8: Used spinal model to explain to pt and daughter radiculopathy and what could be causes of numbness in L hand     Therapeutic Exercise and NMR EXR  '[x]'  (97110) Provided verbal/tactile cueing for activities related to strengthening, flexibility, endurance, ROM  for improvements in cervical, postural, scapular, scapulothoracic and UE control with self care, reaching, carrying, lifting, house/yardwork, driving/computer work.    '[]'  [phone]) Provided verbal/tactile cueing for activities related to improving balance, coordination, kinesthetic sense, posture, motor skill, proprioception  to assist with cervical, scapular, scapulothoracic and UE control with self care, reaching, carrying, lifting, house/yardwork, driving/computer work.    Therapeutic Activities:    '[]'  [phone] or [phone]) Provided verbal/tactile cueing for activities related to improving balance, coordination, kinesthetic sense, posture, motor skill, proprioception and motor activation to allow for proper function of cervical, scapular, scapulothoracic and UE control with self care, carrying, lifting, driving/computer work.     Home Exercise Program:   '[x]'  [phone]) Reviewed/Progressed HEP activities related to strengthening, flexibility, endurance, ROM of cervical, scapular, scapulothoracic and UE control with self care, reaching, carrying, lifting, house/yardwork, driving/computer work  '[]'  [phone]) Reviewed/Progressed HEP activities related to improving balance, coordination, kinesthetic sense, posture, motor skill, proprioception of cervical, scapular, scapulothoracic and UE control with self care, reaching, carrying, lifting,  house/yardwork, driving/computer work      Manual Treatments:  PROM / STM / Oscillations-Mobs:  G-I, II, III, IV (PA's, Inf., Post.)  '[x]'  (97140) Provided manual therapy to mobilize soft tissue/joints of cervical/CT, scapular GHJ and UE for the purpose of decreasing headache, modulating pain, promoting relaxation,  increasing ROM, reducing/eliminating soft tissue swelling/inflammation/restriction, improving soft tissue extensibility and allowing for proper ROM for normal function with self care, reaching, carrying, lifting, house/yardwork, driving/computer work    Modalities:   3/7:  Mechanical cervical traction, 16/8, 30/10 on/off x15 mins w/bolsters under BLEs    2/28 MHP x 10 min after tx insupine  2/27 Mechanical cervical traction, 15/8, 30/10 on/off x15 mins w/bolsters under BLEs  2/17:  Pt said PT could increase pull. Patient was positioned supine on traction table with bolsters under bilateral knees with head/neck in traction device. Parameters were as follows: 12/18 max/min pounds with on/off ratio of 30/10, for a total of 12 minutes.Patient was given panic button as well as instructed how to use it if experiencing pain. Patient was also given bell to call if anything is needed.     07/29/18 Patient was positioned supine on traction table with bolsters under bilateral knees with head/neck in traction device. Parameters were as follows: 12/8 max/min pounds with on/off ratio of 30/10, for a total of 12 minutes.Patient was given panic button as well as instructed how to use it if experiencing pain. Patient was also given bell to call if anything is needed.     2/15, 2/7: supine with roll under knees, CP neck/L UT 10 min,covered with warm blanket    Charges:  Timed Code Treatment Minutes: 51   Total Treatment Minutes: 67       '[]'  EVAL - LOW (97161)   '[]'  EVAL - MOD (79432)  '[]'  EVAL - HIGH (97163)  '[]'  RE-EVAL (76147)  '[x]'  WL(29574) x 1     '[]'  NMR (73403) x      '[]'  Ionto  '[x]'  Manual (97140) x  1    '[]'  Ultrasound  '[x]'   TA x  1     [  x] Mech Traction [phone]  '[]'  Home Management Training x   '[]'  ES (un) [phone]):           '[]'  Aquatic    '[]'  ES(attended) (09811)   '[]'  Group    '[]'  Other:    GOALS:Patient stated goal: resolve pain to return to normal   '[x]'  Progressing: '[]'  Met: '[]'  Not Met: '[]'  Adjusted    Therapist goals for Patient:   Short Term Goals: To be achieved in: 2 weeks  1. Independent in HEP and progression per patient tolerance, in order to prevent re-injury.   '[x]'  Progressing: '[]'  Met: '[]'  Not Met: '[]'  Adjusted  2. Patient will have a decrease in pain to facilitate improvement in movement, function, and ADLs as indicated by Functional Deficits.  '[x]'  Progressing: '[]'  Met: '[]'  Not Met: '[]'  Adjusted    Long Term Goals: To be achieved in: 4 weeks  1. Disability index score of 20% or less for the NDI to assist with reaching prior level of function.   '[]'  Progressing: '[]'  Met: '[]'  Not Met: '[]'  Adjusted  2. Patient will demonstrate increased AROM to [facility] of cervical/thoracic spine to allow for proper joint functioning as indicated by patients Functional Deficits.   '[]'  Progressing: '[]'  Met: '[]'  Not Met: '[]'  Adjusted  3. Patient will demonstrate an increase in postural awareness and control and activation of  Deep cervical stabilizers to allow for proper functional mobility as indicated by patients Functional Deficits.   '[]'  Progressing: '[]'  Met: '[]'  Not Met: '[]'  Adjusted  4. Patient will return to functional activities including cooking, cleaning without increased symptoms or restriction.   '[]'  Progressing: '[]'  Met: '[]'  Not Met: '[]'  Adjusted  5. To be able to sleep through night without waking due to pain  '[]'  Progressing: '[]'  Met: '[]'  Not Met: '[]'  Adjusted   6. Patient to improve B hip extension and abduction strength to 4+/5 in order to improve standing and walking tasks, pain-free  '[]'  Progressing: '[]'  Met: '[]'  Not Met: '[]'  Adjusted   7. Patient to improve core stability to 4/5 to improve lifting ability and ability to navigate stairs  '[]'  Progressing: '[]'  Met: '[]'   Not Met: '[]'  Adjusted     Overall Progression Towards Functional goals/ Treatment Progress Update:  '[]'  Patient is progressing as expected towards functional goals listed.    '[]'  Progression is slowed due to complexities/Impairments listed.  '[]'  Progression has been slowed due to co-morbidities.  '[x]'  Plan just implemented, too soon to assess goals progression <30days   '[]'  Goals require adjustment due to lack of progress  '[]'  Patient is not progressing as expected and requires additional follow up with physician  '[]'  Other    Persisting Functional Limitations/Impairments:  '[]' Sitting '[]' Standing   '[]' Walking '[]' Squatting/bending    '[]' Stairs '[x]' ADL's    '[]' Transfers '[x]' Reaching  '[x]' Housework '[x]' Lifting  '[]' Driving '[]' Job related tasks  '[]' Sports/Recreation  '[x]' Sleeping  '[]' Other:    ASSESSMENT:    In addition to cervical spine and LUE complaints, pt complains of B lateral hip & thigh pain and LLE N&T that increases with activity and limits IADLs.  Patient presents w/decreased strength of core and BLEs increasing stress & strain to BLE musculature resulting in LE pain.  Patient can benefit from skilled PT to address strength deficits and return pt to PLOF.    Treatment/Activity Tolerance:  '[x]'  Patient able to complete tx  '[]'  Patient limited by fatique  '[]'  Patient limited by pain  '[]'  Patient limited by other medical complications  '[]'   Other:     Prognosis: '[x]'  Good '[]'  Fair  '[]'  Poor    Patient Requires Follow-up: '[x]'  Yes  '[]'  No    PLAN:  PT 3x / week for 4 weeks for treatment of cervical & LUE symptoms along with lower back & LLE symptoms  '[x]'  Continue per plan of care '[]'  Alter current plan (see comments)  '[]'  Plan of care initiated '[]'  Hold pending MD visit '[]'  Discharge    Electronically signed by: Gunnar Fusi, PT , DPT        Note: If patient does not return for scheduled/ recommended follow up visits, his note will serve as a discharge from care along with most recent update on progress.

## 2018-09-07 ENCOUNTER — Inpatient Hospital Stay: Admit: 2018-09-07 | Payer: MEDICAID

## 2018-09-07 NOTE — Other (Signed)
[facility]  Phone: [phone]   Fax: [phone]          Physical Therapy Treatment Note/ Progress Report:     Date:  09/07/2018     Patient Name:  Faith Mason    DOB:  11-10-1975  MRN: [id number]  Restrictions/Precautions:    Medical/Treatment Diagnosis Information:  ?? Diagnosis: Neck pain M54.2,  B shoudler pain M25.511 w B UE radiculopathy;    M54.5, G89.29 (ICD-10-CM) - Chronic bilateral low back pain without sciatica  ?? Treatment diagnosis:  Increased pain and decreased scapular stability  Insurance/Certification information:     Physician Information:  Referring Practitioner: [name], MD  Plan of care signed (Y/N): '[x]'$   Yes '[]'$   No Cosign 2/3    Date of Patient follow up with Physician:  Pt states is to complete PT then MRI  MD appt 2/20     Progress Report: '[]'$   Yes  '[x]'$   No     Date Range for reporting period:   Beginning: 07/24/2018  Ending:  08/14/2018  Progress report due (10 Rx/or 30 days whichever is less): 10 days     Recertification due (POC duration/ or 90 days whichever is less): n;     Visit # Insurance Allowable Auth required? Date Range   12/12 + 1/12 30 v/[facility] '[]'$   Yes  '[x]'$   No      Latex Allergy:  '[x]'$ NO      '[]'$ YES  Preferred Language for Healthcare:   '[]'$ English       '[x]'$ other: [place]  Functional Scale: Quick Dash  Score 41,  61% disability Date assessed:07/24/2018    Pain level:  5/10 L shoulder;   4/10 B lateral hip pain, LLE N&T    Dtr translating for patient    SUBJECTIVE: 3/19: Patient states the numbness and tingling is less overall since starting PT in her B UEs/hands, but that she still does have problems holding objects in her hands.  At times, she is dropping objects due to N/T.  Doing her HEP regularly.        Pt reports obtaining new order for treatment of lower back pain.  Reports lower back surgery due to RLE N&T in 2016, isn't sure what surgery it was but description may be laminectomy.  States that after surgery  RLE symptoms improved but LLE slowly developed and now has LLE N&T into L foot in addition to B side/hip pain.  LLE pain & symptoms limited walking and ability to perform steps.  Lifting is extremely hard as well.  Had injection prior to surgery which did help with symptoms but returned within about a weeks time.      Having a little more pain in back of shoulder today, feels that it may be due to increased activity yesterday.        2/28 Pt's dtr present and translated for pt.  Pt reports PT cont to help - is able to sleep better than previously  2/27 Pt's dtr present and translated for pt.  Pt reports pain is a little worse today, washed dishes last night and both pain and N&T were bad all night.  Has follow-up w/doctor tomorrow and plans to get a prescription for treatment of her lower back then. Has to leave on time today so would like to do mechanical traction first or in middle of session.    2/24  Pt ddtr translated for pt today,  Pt  states having only n/t on R scapula , and on L lateral brachium.  L UE N/T is now intermittent and is able to perform more ADL/IADL, sleeping better as well.    2/17: daughter present for translation;  Patient reporting right sided mid cervical pain.  Feels that traction is helping.  Also has some hand numbness and burning in her right shoulder blade that is intermittent in nature.  2/15 Pt's daughter translating for pt.  Reports she feels better than last time.  Thinks PT tx is helping. Says the HEP ex help. Requests blue tband for HEP   2/11 Pt's daughter translating for pt.  She does have some numbness and burning in a small area above the right shoulder,but it is less than it was. Pt states she felt much better after the mechanical traction. Pt also report Pt's daughter states it seems like the pt is feeling better.     ??? OBJECTIVE:    ??? Observation: 2/15 TPs R UT  ??? Test measurements:      RESTRICTIONS/PRECAUTIONS: Pt's daughter reports she had lower back in  2017    Exercises/Interventions:   Therapeutic Exercise (97110) Resistance / level Sets/sec Reps Notes   UBE  4 min =25F/2B     Back to wall, towel roll behind neck:  cerv retraction  B scap retract,  B ER with lime green  B horiz AB with lime green  Reverse W's    ?? Chin tuck w/towel behind head    2     10x  10x  10x  10x    10x Added to HEP 3/19      Back to wall   CTwith rotation B  CT with flex/ext  Reverse W's  1   10X   supine;   Mid row at free motion   TCs/VCs to keep UT relaxed    TB:  ?? High row  ?? D1 ext  ?? Shld add Lime green     1215TCs/VCs to keep UT relaxed    LPD TCs/VCs to keep UT relaxed    Per pt request pulley sho flex    Free weights--shld abd to 90  scap retract  Bent over ext    [place] on wall---rot at 85      ?? Potter circles w/shld flexed to 90 deg  X        15 ea B         /towel   Unilateral pec stretch at doorway     Mat Table:  Bridge  SLR  Prone hip ext  S/L hip abd  Hookline SKTC  Hookline piriformis stretch     10  10 B  5 B  5 B  3 L/R   3 L/R   Added to HEP 3/7  Added to HEP 3/7  Added to HEP 3/7  Added to HEP 3/7  AAdded to HEP 3/19  dded to HEP 3/19   Therapeutic Activities (21308)       2/20:  After pt & dtr request, educated pt and dtr on probable nerve impingement causing symptoms and effects of good posture on reducing neural compression and symptoms using cervical model. Both reported understanding                                   NMR re-education [phone])       Postural ed re  importance of good upright sitting posture to reduce pain and nerve irritation. Pt instructed iin upright posture for sit and stand and use of lumbar roll                                   Manual Intervention (97140)       Cerv mobs/manip  ?? P/A mobs T2, T1, C7  ?? Gentle cervical distraction       Manual  cervical distraction & SOR  x5 min     ?? STM R cervical paraspinals, B upper trap,   ?? R upper trap stretch  ?? R levator stretch  ?? R scalene stretch  ?? SOR  ?? B chest stretch  8 mins as well as R pec  minor and L Platysmus, 2x20"2x20"  2 mins  3x30"       Added UT stretch to HEP 2/18   MFR/DTM UT and Levator SCap      2/11, 2/7: supine with roll under knees gentle STM L> R UT, scalenes, suboccipitals, gentle manual cervical txn, gentle manual cervical sb/rot/flex stetches      2/8: STM B/L UT with trigger point release; manual cervical rotation and side bending stretching     2/15 IASTM R>L UT, levator, scalenes, SCM splenius cervicus and capitus      3/19: MET/rib/manual  MET to correct C5 FRSR. C4 ERSR, C6 FRSL. C7 ERSL, T1 FRSR , R 1st rib inferior glides followed with brief upper limb tension gliding X5                HEP Instruction:    3/7:  Access Code: [email or web address]   URL: [email or web address]   Date: 08/26/2018   Prepared by: Gunnar Fusi     Exercises   Supine Straight Leg Raises - 10 reps - 1 sets - 2x daily - 7x weekly   Sidelying Hip Abduction - 10 reps - 1 sets - 2x daily - 7x weekly   Prone Hip Extension - 10 reps - 1 sets - 2x daily - 7x weekly   Supine Bridge - 10 reps - 1 sets - 2x daily - 7x weekly     2/28 upright posture & use lumbar roll  2/18:  Added R upper trap stretch w/R hand on edge of table as needed to HEP, distributed handout    Patient education: 2/11: handout  Lime tband 1x/day, 15x high row, lpd, mid row  -pt educated on diagnosis, prognosis and expectations for rehab  -pt provided with written and illustrated instructions for HEP  -all pt questions were answered  2/8: Used spinal model to explain to pt and daughter radiculopathy and what could be causes of numbness in L hand     Therapeutic Exercise and NMR EXR  '[x]'$  (97110) Provided verbal/tactile cueing for activities related to strengthening, flexibility, endurance, ROM  for improvements in cervical, postural, scapular, scapulothoracic and UE control with self care, reaching, carrying, lifting, house/yardwork, driving/computer work.    '[]'$  [phone]) Provided verbal/tactile cueing for activities related to improving  balance, coordination, kinesthetic sense, posture, motor skill, proprioception  to assist with cervical, scapular, scapulothoracic and UE control with self care, reaching, carrying, lifting, house/yardwork, driving/computer work.    Therapeutic Activities:    '[]'$  [phone] or [phone]) Provided verbal/tactile cueing for activities related to improving balance, coordination, kinesthetic sense, posture, motor skill, proprioception and motor activation to allow for proper function of  cervical, scapular, scapulothoracic and UE control with self care, carrying, lifting, driving/computer work.     Home Exercise Program:   '[x]'$  [phone]) Reviewed/Progressed HEP activities related to strengthening, flexibility, endurance, ROM of cervical, scapular, scapulothoracic and UE control with self care, reaching, carrying, lifting, house/yardwork, driving/computer work  '[]'$  [phone]) Reviewed/Progressed HEP activities related to improving balance, coordination, kinesthetic sense, posture, motor skill, proprioception of cervical, scapular, scapulothoracic and UE control with self care, reaching, carrying, lifting, house/yardwork, driving/computer work      Manual Treatments:  PROM / STM / Oscillations-Mobs:  G-I, II, III, IV (PA's, Inf., Post.)  '[x]'$  (97140) Provided manual therapy to mobilize soft tissue/joints of cervical/CT, scapular GHJ and UE for the purpose of decreasing headache, modulating pain, promoting relaxation,  increasing ROM, reducing/eliminating soft tissue swelling/inflammation/restriction, improving soft tissue extensibility and allowing for proper ROM for normal function with self care, reaching, carrying, lifting, house/yardwork, driving/computer work    Modalities:   3/7:  Mechanical cervical traction, 16/8, 30/10 on/off x15 mins w/bolsters under BLEs    2/28 MHP x 10 min after tx insupine  2/27 Mechanical cervical traction, 15/8, 30/10 on/off x15 mins w/bolsters under BLEs  2/17:  Pt said PT could increase pull. Patient was  positioned supine on traction table with bolsters under bilateral knees with head/neck in traction device. Parameters were as follows: 12/18 max/min pounds with on/off ratio of 30/10, for a total of 12 minutes.Patient was given panic button as well as instructed how to use it if experiencing pain. Patient was also given bell to call if anything is needed.     07/29/18 Patient was positioned supine on traction table with bolsters under bilateral knees with head/neck in traction device. Parameters were as follows: 12/8 max/min pounds with on/off ratio of 30/10, for a total of 12 minutes.Patient was given panic button as well as instructed how to use it if experiencing pain. Patient was also given bell to call if anything is needed.     2/15, 2/7: supine with roll under knees, CP neck/L UT 10 min,covered with warm blanket    Charges:  Timed Code Treatment Minutes: 61   Total Treatment Minutes: 61       '[]'$  EVAL - LOW (97161)   '[]'$  EVAL - MOD (25053)  '[]'$  EVAL - HIGH (97163)  '[]'$  RE-EVAL (97673)  '[x]'$  AL(93790) x 2    '[]'$  NMR (24097) x      '[]'$  Ionto  '[x]'$  Manual (97140) x  2    '[]'$  Ultrasound  '[]'$  TA x  1     '[x]'$  Mech Traction (35329)  '[]'$  Home Management Training x   '[]'$  ES (un) (97014):           '[]'$  Aquatic    '[]'$  ES(attended) (92426)   '[]'$  Group    '[]'$  Other:    GOALS:Patient stated goal: resolve pain to return to normal   '[x]'$  Progressing: '[]'$  Met: '[]'$  Not Met: '[]'$  Adjusted    Therapist goals for Patient:   Short Term Goals: To be achieved in: 2 weeks  1. Independent in HEP and progression per patient tolerance, in order to prevent re-injury.   '[x]'$  Progressing: '[]'$  Met: '[]'$  Not Met: '[]'$  Adjusted  2. Patient will have a decrease in pain to facilitate improvement in movement, function, and ADLs as indicated by Functional Deficits.  '[x]'$  Progressing: '[]'$  Met: '[]'$  Not Met: '[]'$  Adjusted    Long Term Goals: To be achieved in: 4 weeks  1. Disability index score  of 20% or less for the NDI to assist with reaching prior level of function.   '[]'$   Progressing: '[]'$  Met: '[]'$  Not Met: '[]'$  Adjusted  2. Patient will demonstrate increased AROM to [facility] of cervical/thoracic spine to allow for proper joint functioning as indicated by patients Functional Deficits.   '[]'$  Progressing: '[]'$  Met: '[]'$  Not Met: '[]'$  Adjusted  3. Patient will demonstrate an increase in postural awareness and control and activation of  Deep cervical stabilizers to allow for proper functional mobility as indicated by patients Functional Deficits.   '[]'$  Progressing: '[]'$  Met: '[]'$  Not Met: '[]'$  Adjusted  4. Patient will return to functional activities including cooking, cleaning without increased symptoms or restriction.   '[]'$  Progressing: '[]'$  Met: '[]'$  Not Met: '[]'$  Adjusted  5. To be able to sleep through night without waking due to pain  '[]'$  Progressing: '[]'$  Met: '[]'$  Not Met: '[]'$  Adjusted   6. Patient to improve B hip extension and abduction strength to 4+/5 in order to improve standing and walking tasks, pain-free  '[]'$  Progressing: '[]'$  Met: '[]'$  Not Met: '[]'$  Adjusted   7. Patient to improve core stability to 4/5 to improve lifting ability and ability to navigate stairs  '[]'$  Progressing: '[]'$  Met: '[]'$  Not Met: '[]'$  Adjusted     Overall Progression Towards Functional goals/ Treatment Progress Update:  '[x]'$  Patient is progressing as expected towards functional goals listed.    '[]'$  Progression is slowed due to complexities/Impairments listed.  '[]'$  Progression has been slowed due to co-morbidities.  '[]'$  Plan just implemented, too soon to assess goals progression <30days   '[]'$  Goals require adjustment due to lack of progress  '[]'$  Patient is not progressing as expected and requires additional follow up with physician  '[]'$  Other    Persisting Functional Limitations/Impairments:  '[]'$ Sitting '[]'$ Standing   '[]'$ Walking '[]'$ Squatting/bending    '[]'$ Stairs '[x]'$ ADL's    '[]'$ Transfers '[x]'$ Reaching  '[x]'$ Housework '[x]'$ Lifting  '[]'$ Driving '[]'$ Job related tasks  '[]'$ Sports/Recreation  '[x]'$ Sleeping  '[]'$ Other:    ASSESSMENT:    Patient ;presents with standing B Hip ER and foot  turn out with significant piriformis and gluteal tightness likely related to Hip AB weakness.  Patient also presented with L Levator tightness with concurrent L scapular downward rotation as well as C-T Postural malalignments with an elevated R 1st rib with pec tightness.    Patient with diminished periscapular muscular recruitment and L Inferior capsular tightness limiting proper scapulohumeral rhythm.  Patient presents w/decreased strength of core and BLEs increasing stress & strain to BLE musculature resulting in LE pain.  Patient can benefit from skilled PT to address strength deficits and return pt to PLOF.    Treatment/Activity Tolerance:  '[x]'$  Patient able to complete tx  '[]'$  Patient limited by fatique  '[]'$  Patient limited by pain  '[]'$  Patient limited by other medical complications  '[]'$  Other:     Prognosis: '[x]'$  Good '[]'$  Fair  '[]'$  Poor    Patient Requires Follow-up: '[x]'$  Yes  '[]'$  No    PLAN:  PT 3x / week for 4 weeks for treatment of cervical & LUE symptoms along with lower back & LLE symptoms  '[x]'$  Continue per plan of care '[]'$  Alter current plan (see comments)  '[]'$  Plan of care initiated '[]'$  Hold pending MD visit '[]'$  Discharge    Electronically signed by: Consuello Bossier, PT , MSPT        Note: If patient does not return for scheduled/ recommended follow up visits, his note will serve as a discharge  from care along with most recent update on progress.

## 2018-09-08 ENCOUNTER — Inpatient Hospital Stay: Payer: MEDICAID

## 2018-09-08 NOTE — Progress Notes (Signed)
Physical Therapy  Cancellation/No-show Note  Patient Name:  Faith Mason  DOB:  1976/06/12   Date:  09/08/2018  Cancelled visits to date: 1  No-shows to date: 0    Patient status for today's appointment patient:  [x]   Cancelled 09/08/18  []   Rescheduled appointment  []   No-show     Reason given by patient:  []   Patient ill  []   Conflicting appointment  []   No transportation    []   Conflict with work  []   No reason given  [x]   Other:  09/08/18 Covid-19    Comments:      Phone call information:   []   Phone call made today to patient at _ time at number provided:      []   Patient answered, conversation as follows:    []   Patient did not answer, message left as follows:  [x]   Phone call not made today    Electronically signed by:  Vernell Leep, PT

## 2018-09-14 ENCOUNTER — Inpatient Hospital Stay: Payer: MEDICAID

## 2018-09-14 NOTE — Progress Notes (Signed)
Contacted pt at number provided in chart.  Patient answered, conversation as follows:      Notified that at this time we are cancelling all patients without an extremely urgent need for therapy services due to the COVID-19 pandemic in order to limit health risks to our patients and staff members.  Notified patient that we will contact them once the situation has changed in order to restart therapy services     Carmela Rima, PT

## 2018-09-14 NOTE — Progress Notes (Signed)
Sent email to [email or web address] notifying patient of planned cancellation of remaining visits and to either respond to email or contact front desk to confirm.  Awaiting response before cancelling visits.  Phone number in chart doesn't work.

## 2018-09-22 ENCOUNTER — Encounter: Attending: Family Medicine

## 2018-12-15 ENCOUNTER — Telehealth

## 2018-12-15 MED ORDER — LISINOPRIL-HYDROCHLOROTHIAZIDE 10-12.5 MG PO TABS
ORAL_TABLET | Freq: Every day | ORAL | 2 refills | Status: DC
Start: 2018-12-15 — End: 2019-02-13

## 2018-12-15 NOTE — Telephone Encounter (Signed)
Pt's daughter informed.  She will call back to schedule since not due for blood work until 01/2019 per Dr. Harl Favor  Close Encounter

## 2018-12-15 NOTE — Telephone Encounter (Signed)
Med pended

## 2018-12-15 NOTE — Telephone Encounter (Signed)
[phone] (M)  OV: 08/18/2018    Patient is requesting an appt for DM check, check blood sugar, and blood work.    Patient refill:    lisinopril-hydroCHLOROthiazide (PRINZIDE;ZESTORETIC) 10-12.5 MG per tablet    Please advise.

## 2018-12-15 NOTE — Telephone Encounter (Signed)
Ok to schedule VV. Med e-scribed.

## 2019-02-13 ENCOUNTER — Telehealth

## 2019-02-13 MED ORDER — LISINOPRIL-HYDROCHLOROTHIAZIDE 10-12.5 MG PO TABS
10-12.5 MG | ORAL_TABLET | Freq: Every day | ORAL | 0 refills | Status: DC
Start: 2019-02-13 — End: 2019-04-05

## 2019-02-13 NOTE — Telephone Encounter (Signed)
-----   Message from [facility] sent at 02/13/2019 10:56 AM EDT -----  Subject: Appointment Request    Reason for Call: Routine Existing Condition Follow Up    QUESTIONS  Type of Appointment? Established Patient  Reason for appointment request? Other - All appointments stated they don't   meet the providers needs  Additional Information for Provider? Patient prefers in person if possibly   if not patient needs a 2:30 appointment   ---------------------------------------------------------------------------  --------------  Organ  What is the best way for the office to contact you? OK to leave message on   voicemail  Preferred Call Back Phone Number? [phone]  ---------------------------------------------------------------------------  --------------  SCRIPT ANSWERS  Relationship to Patient? Self  Appointment reason? Well Care/Follow Ups  Select a Well Care/Follow Ups appointment reason? Adult Existing Condition   Follow Up [Diabetes   CHF   COPD   Hypertension/Blood Pressure Check]  (Is the patient requesting to be seen urgently for their symptoms?)? No  Is this follow up request related to routine Diabetes Management? No  Are you having any new concerns about your existing condition? No  Have you been diagnosed with   tested for   or told that you are suspected of having COVID-19 (Coronavirus)? No  Have you had a fever or taken medication to treat a fever within the past   3 days? No  Have you had a cough   shortness of breath or flu-like symptoms within the past 3 days? No  Do you currently have flu-like symptoms including fever or chills   cough   shortness of breath   or difficulty breathing   or new loss of taste or smell? No  (Service Expert ??? click yes below to proceed with [employer] As Usual   Scheduling)? Yes

## 2019-02-13 NOTE — Telephone Encounter (Signed)
Med e-scribed. Message closed/completed.

## 2019-02-13 NOTE — Telephone Encounter (Signed)
Spoke with pt's daughter. Pt needs an appt for med check/follow up. Pt is currently out of the BP medication and will need a refill. Pt is currently scheduled on 8/28  Please advise

## 2019-02-16 ENCOUNTER — Encounter: Payer: MEDICAID | Attending: Family Medicine

## 2019-02-16 NOTE — Progress Notes (Signed)
This encounter was created in error - please disregard.

## 2019-02-16 NOTE — Progress Notes (Deleted)
Subjective:      Patient ID: Faith Mason is a 43 y.o. female.    HPI  Patient is  being evaluated by a Virtual Visit (video visit) encounter to address concerns as mentioned above.  A caregiver was present when appropriate. Due to this being a Scientist, physiological (During [id number]-91 public health emergency), evaluation of the following organ systems was limited: Vitals/Constitutional/EENT/Resp/CV/GI/GU/MS/Neuro/Skin/Heme-Lymph-Imm.  Pursuant to the emergency declaration under the [place] waiver authority and the [employer] Act, this Virtual Visit was conducted with patient's (and/or legal guardian's) consent, to reduce the patient's risk of exposure to COVID-19 and provide necessary medical care.  The patient (and/or legal guardian) has also been advised to contact this office for worsening conditions or problems, and seek emergency medical treatment and/or call 911 if deemed necessary.       Services were provided through a video synchronous discussion virtually to substitute for in-person clinic visit. Patient and provider were located at their individual homes.     "THIS VISIT WAS COMPLETED VIRTUALLY USING [email or web address].[email or web address]"    Interpreter is declined by pt.    Results for KARALINE, BURESH (MRN [id number]) as of 02/16/2019 11:06   Ref. Range 08/18/2018 15:14   Sodium Latest Ref Range: 136 - 145 mmol/L 139   Potassium Latest Ref Range: 3.5 - 5.1 mmol/L 4.0   Chloride Latest Ref Range: 99 - 110 mmol/L 98 (L)   CO2 Latest Ref Range: 21 - 32 mmol/L 27   BUN Latest Ref Range: 7 - 20 mg/dL 10   Creatinine Latest Ref Range: 0.6 - 1.1 mg/dL 0.7   Anion Gap Latest Ref Range: 3 - 16  14   GFR Non-African American Latest Ref Range: >60  >60   GFR African American Latest Ref Range: >60  >60   Glucose Latest Ref Range: 70 - 99 mg/dL 98   Calcium Latest Ref Range: 8.3 - 10.6 mg/dL 9.5   Total Protein Latest Ref Range: 6.4 - 8.2 g/dL 7.2    Cholesterol, Total Latest Ref Range: 0 - 199 mg/dL 181   HDL Cholesterol Latest Ref Range: 40 - 60 mg/dL 44   LDL Calculated Latest Ref Range: <100 mg/dL 109 (H)   Triglycerides Latest Ref Range: 0 - 150 mg/dL 139   VLDL Cholesterol Calculated Latest Ref Range: Not Established mg/dL 28   Albumin Latest Ref Range: 3.4 - 5.0 g/dL 4.2   Globulin Latest Units: g/dL 3.0   Albumin/Globulin Ratio Latest Ref Range: 1.1 - 2.2  1.4   Alk Phos Latest Ref Range: 40 - 129 U/L 103   ALT Latest Ref Range: 10 - 40 U/L 16   AST Latest Ref Range: 15 - 37 U/L 19   Bilirubin Latest Ref Range: 0.0 - 1.0 mg/dL 0.4   Hemoglobin A1C Latest Ref Range: See comment % 6.3   eAG (mg/dL) Latest Units: mg/dL 134.1   Creatinine, Ur Latest Ref Range: 28.0 - 259.0 mg/dL 224.1   Microalbumin Creatinine Ratio Latest Ref Range: 0.0 - 30.0 mg/g see below   Microalbumin, Random Urine Latest Ref Range: <2.0 mg/dL <1.20     YQM:VHQIO well.  Associated w/nothing else new.  Worsened by nothing reported.  Improves w/medication:no side effects reported.  Adds salt to food at the table:No.  Denies cp/sob/pnd/ankle edema/dizziness.     Diabetes:A1c of 6.7=diabetes.  LDL=141 on 04/12/19.  Fasting-morning sugars=109-120s.  2hrs postprandial sugars=160s.  Associated w/nothing.  ACE  in-hib:no.  Checks feet daily.  Eye doctor exam:<55mo per pt'.  Improving factor:better diet regime. Metformin not filled since wants to address with diet only at this time.  Worsening factor:none reported.  +fhx DM2(mother).  Denies polyuria/polyphagia/polydipsia/unexpected weight loss or gain.    GERD f/u: Taking medication without side effects. Doing well.   No new associated/worsening or other improving factors.  Denies abdo pain/n-v/diarrhea/melena-blood in stool.      F/u tRK:YHCWCBJSEshoulder pain >420monthdescribed as mild sharp pain.  Was seen ortho on 12/13:PT was advised & prescribed naproxen. No med side effects.  Has had approx 4weeks of PT that is helping. Wishes to try  additional PT for this & also request it for her lower back:hx of spine .  Preceding injury:none recalled.  Associated with mild intermittent neck pain(posterior) & mild intermittent radicular pain bilateral arms.  Worsened(aggravated) by shoulder ROM & abduction. +night time shoulder pain.  Improves with naproxen/PT.  Denies BUE weakness-paralysis/skin wounds-ulcers-lesions.  Denies BLE weakness-paresthesia-paralysis/urinary or bowel control loss or change/saddle anesthesia/gait abnormality.    No Known Allergies    Current Outpatient Medications on File Prior to Visit   Medication Sig Dispense Refill   ??? lisinopril-hydroCHLOROthiazide (PRINZIDE;ZESTORETIC) 10-12.5 MG per tablet Take 1 tablet by mouth daily 30 tablet 0   ??? blood glucose monitor strips Test 2times a day 100 strip 4   ??? glucose monitoring kit (FREESTYLE) monitoring kit 1 each by Does not apply route once for 1 dose Glucometer:QD-BID testing. Ok to provide insurance covered device. 1 kit 0   ??? Lancets MISC BID testing 100 each 6     No current facility-administered medications on file prior to visit.        Past Medical History:   Diagnosis Date   ??? GERD (gastroesophageal reflux disease)    ??? Hypertension 2016           Social History     Tobacco Use   ??? Smoking status: Never Smoker   ??? Smokeless tobacco: Never Used   Substance Use Topics   ??? Alcohol use: Never     Frequency: Never   ??? Drug use: Never     Social History     Substance and Sexual Activity   Drug Use Never               Review of Systems    Objective:   Physical Exam    Assessment:      ***      Plan:      ***        RaHarden MoMD

## 2019-02-28 NOTE — Telephone Encounter (Signed)
-----   Message from [place] sent at 02/28/2019  1:19 PM EDT -----  Subject: Appointment Request    Reason for Call: Semi-Routine Return from RN Triage    QUESTIONS  Type of Appointment? Established Patient  Reason for appointment request? No appointments available during search  Additional Information for Provider? from triage   pain in eye headache blurred vision   swollen   itchy   ---------------------------------------------------------------------------  --------------  CALL BACK INFO  What is the best way for the office to contact you? OK to leave message on   voicemail  Preferred Call Back Phone Number? [phone]  ---------------------------------------------------------------------------  --------------  SCRIPT ANSWERS  Patient needs to be seen within 5 days? Yes  Nurse Name? unknown  (Did RN indicate the need for Red Scheduling?)? No  Have you been diagnosed with COVID-19 (Coronavirus)   tested for COVID-19 (Coronavirus)   or told that you are suspected of having COVID-19 (Coronavirus)? No  Have you had a fever or taken medication to treat a fever within the past   3 days? No  Have you had a cough   shortness of breath or flu-like symptoms within the past 3 days? No  Do you currently have flu-like symptoms including fever or chills   cough   shortness of breath   or difficulty breathing   or new loss of taste or smell? No  (Service Expert ??? click yes below to proceed with [employer] As Usual   Scheduling)? Yes

## 2019-02-28 NOTE — Telephone Encounter (Signed)
Reason for Disposition  ??? Mild eye pains are a recurrent problem    Answer Assessment - Initial Assessment Questions  1. ONSET: "When did the pain start?" (e.g., minutes, hours, days)      Months ago  2. TIMING: "Does the pain come and go, or has it been constant since it started?" (e.g., constant, intermittent, fleeting)      Pain is constant  3. SEVERITY: "How bad is the pain?"   (Scale 1-10; mild, moderate or severe)    - MILD (1-3): doesn't interfere with normal activities     - MODERATE (4-7): interferes with normal activities or awakens from sleep     - SEVERE (8-10): excruciating pain and patient unable to do normal activities      moderate  4. LOCATION: "Where does it hurt?"  (e.g., eyelid, eye, cheekbone)      Left eye has "bump"  5. CAUSE: "What do you think is causing the pain?"      unsure  6. VISION: "Do you have blurred vision or changes in your vision?"       Yes blurred vision, diffficulty seeing in liht  7. EYE DISCHARGE: "Is there any discharge (pus) from the eye(s)?"  If yes, ask: "What color is it?"       no  8. FEVER: "Do you have a fever?" If so, ask: "What is it, how was it measured, and when did it start?"       no  9. OTHER SYMPTOMS: "Do you have any other symptoms?" (e.g., headache, nasal discharge, facial rash)      denies  10. PREGNANCY: "Is there any chance you are pregnant?" "When was your last menstrual period?"        *no    Protocols used: EYE PAIN-ADULT-[place]  Caller provided care advice and instructed to call back with worsening symptoms.   Spoke with ebony to schedule apt

## 2019-03-01 NOTE — Telephone Encounter (Signed)
Eye sx/headache:needs eval in-person at local urgent care today.  F/u with me after discharge from that facility.

## 2019-03-01 NOTE — Telephone Encounter (Signed)
Lm informing the patient/pt daughter.  Close Encounter

## 2019-04-05 ENCOUNTER — Encounter

## 2019-04-05 ENCOUNTER — Telehealth

## 2019-04-05 MED ORDER — LISINOPRIL-HYDROCHLOROTHIAZIDE 10-12.5 MG PO TABS
ORAL_TABLET | Freq: Every day | ORAL | 0 refills | Status: DC
Start: 2019-04-05 — End: 2019-08-30

## 2019-04-05 NOTE — Telephone Encounter (Signed)
Referral pended

## 2019-04-05 NOTE — Telephone Encounter (Signed)
-----   Message from [place] sent at 04/04/2019  3:34 PM EDT -----  Subject: Refill Request    QUESTIONS  Name of Medication? lisinopril-hydroCHLOROthiazide (PRINZIDE;ZESTORETIC)   10-12.5 MG per tablet  Patient-reported dosage and instructions? 1 by mouth daily  How many days do you have left? 0  Preferred Pharmacy? [facility] [address]   [place] - [address] - [place] [phone] - F [phone]  Pharmacy phone number (if available)? [phone]  Additional Information for Provider? patient is completely out of this   medication and needs it asap. she ran out yesterday.  ---------------------------------------------------------------------------  --------------  CALL BACK INFO  What is the best way for the office to contact you? OK to leave message on   voicemail  Preferred Call Back Phone Number? [phone]

## 2019-04-05 NOTE — Telephone Encounter (Signed)
Pt's daughter informed.

## 2019-04-05 NOTE — Telephone Encounter (Signed)
Med pended

## 2019-04-05 NOTE — Telephone Encounter (Signed)
-----   Message from [place] sent at 04/04/2019  3:40 PM EDT -----  Subject: Message to Provider    QUESTIONS  Information for Provider? daughter called to request order for her mother   to have PT for neck   B/L shoulders and B/L legs. She is having pain in all of these areas.   Says she would like it right away so they can make an appt for PT at [facility]. does not want virtual appt.  ---------------------------------------------------------------------------  --------------  CALL BACK INFO  What is the best way for the office to contact you? OK to leave message on   voicemail  Preferred Call Back Phone Number? [phone]  ---------------------------------------------------------------------------  --------------  SCRIPT ANSWERS  Relationship to Patient? Other  Representative Name? tika  Is the Representative on the appropriate HIPAA document in Epic? Yes

## 2019-04-05 NOTE — Telephone Encounter (Signed)
Referral to PT & ortho sent in. Ortho consult is advised.  Eval today at local urgent care also advised.

## 2019-04-11 NOTE — Telephone Encounter (Signed)
General Question     Subject: NEXT STEP   Patient and /or Facility Request: Neck pain has not improved.  Contact Number: Daughter Marchelle Gearing [phone]

## 2019-04-11 NOTE — Telephone Encounter (Signed)
Called and made apt next week with [facility].

## 2019-04-17 ENCOUNTER — Ambulatory Visit: Admit: 2019-04-17 | Discharge: 2019-04-17 | Payer: MEDICAID | Attending: Surgical

## 2019-04-17 ENCOUNTER — Ambulatory Visit: Admit: 2019-04-17 | Discharge: 2019-05-03 | Payer: MEDICAID

## 2019-04-17 DIAGNOSIS — M5412 Radiculopathy, cervical region: Secondary | ICD-10-CM

## 2019-04-18 ENCOUNTER — Inpatient Hospital Stay: Admit: 2019-04-18 | Payer: MEDICAID

## 2019-04-18 DIAGNOSIS — M542 Cervicalgia: Secondary | ICD-10-CM

## 2019-04-18 NOTE — Progress Notes (Addendum)
Subjective:      Patient ID: Faith Mason is a 43 y.o.  female.    Chief Complaint   Patient presents with   ??? Neck Pain     neck pain      Patient does not speak English therefore a medical interpreter was utilized today's visit.      HPI: She is here for follow-up on her cervical radicular pain, 7/10.Marland Kitchen  Symptoms have been ongoing for over 1 year.  Therapy, medications, activity modification have been ineffective in treating her symptoms.    Review of Systems:   Negative for fever or chills.  Positive for numbness and tingling left and right upper extremity.    Past Medical History:   Diagnosis Date   ??? GERD (gastroesophageal reflux disease)    ??? Hypertension 2016       Family History   Problem Relation Age of Onset   ??? High Blood Pressure Mother    ??? Diabetes Mother    ??? High Blood Pressure Father    ??? Diabetes Father        Past Surgical History:   Procedure Laterality Date   ??? BACK SURGERY  2017    L-spine       Social History     Occupational History   ??? Not on file   Tobacco Use   ??? Smoking status: Never Smoker   ??? Smokeless tobacco: Never Used   Substance and Sexual Activity   ??? Alcohol use: Never     Frequency: Never   ??? Drug use: Never   ??? Sexual activity: Yes     Comment: married-4 children        Current Outpatient Medications   Medication Sig Dispense Refill   ??? lisinopril-hydroCHLOROthiazide (PRINZIDE;ZESTORETIC) 10-12.5 MG per tablet Take 1 tablet by mouth daily 30 tablet 0   ??? blood glucose monitor strips Test 2times a day 100 strip 4   ??? glucose monitoring kit (FREESTYLE) monitoring kit 1 each by Does not apply route once for 1 dose Glucometer:QD-BID testing. Ok to provide insurance covered device. 1 kit 0   ??? Lancets MISC BID testing 100 each 6     No current facility-administered medications for this visit.          Objective:   She is alert, oriented x 3, pleasant, well nourished, developed and in no acute distress.    Temp 97.4 ??F (36.3 ??C) (Temporal)      Cervical Spine Exam:  There is not  deformity.  There is  loss of motion.  There is  muscular spasm.   There is  trapezius/ rhomboid tenderness.  There is not spinous process tenderness.  There is not cervical lymphadenopathy.  Spurling Test is Positive.  ??  Examination of the upper extremities are intact with sensation to light touch.  Motor testing shows a decreased grip strength bilaterally.  Otherwise 5/5 in all major motor groups.   Radial, Median and Ulnar nerves are intact.  Hoffman's Sign absent.   Decreased sensation to light touch noted in both left and right hand.  ??  Examination of the upper extremities shows intact perfusion to all extremities.  No cyanosis.  Digits are warm to touch.  Capillary refill is less than 2 seconds.   There is no edema noted.   ??  Examination of the skin over the upper extremities:  Reveals the skin to be intact without lacerations or abrasions.   There are no significant erythema,  rashes or skin lesions.     X Rays: Was performed in the office today:   AP and Lateral C spine x-rays demonstrated mild degenerative disc disease.    Diagnosis:        ICD-10-CM    1. Cervical radicular pain  M54.12 XR CERVICAL SPINE (2-3 VIEWS)     MRI CERVICAL SPINE WO CONTRAST     CANCELED: XR CERVICAL SPINE (2-3 VIEWS)        Assessment/ Plan:      I did spend 25 minutes in the office today with greater than 50% of this time counseling, reviewing diagnostic tests, face to face discussion concerning their diagnosis and treatment options. All of their questions were answered.    Cervical radicular pain with now mild cervical radiculopathy with noted decreased grip strength bilaterally.    The natural history of the patient's diagnosis as well as the treatment options were discussed in full and questions were answered. Risks and benefits of the treatment options also reviewed in detail.     She has failed conservative treatment including medications and extensive physical therapy.  Symptoms have been ongoing for greater than 1 year  and have progressively worsened.    MRI cervical spine ordered today.    Follow Up: After MRI to review test results.  Call or return to clinic prn if these symptoms worsen or fail to improve as anticipated.

## 2019-04-18 NOTE — Other (Signed)
[facility] - Outpatient Physical Therapy  Phone: [phone]   Fax: [phone]    Physical Therapy Daily Treatment Note  Date:  04/18/2019    Patient Name:  Faith Mason    DOB:  1976/04/18  MRN: [id number]  Medical/Treatment Diagnosis Information:   Diagnosis: Neck pain; Bilateral shoulder pain, unspecified chronicity; Pain in both lower extremities  Treatment Diagnosis: Increased cervical and LE pain, decreased cervical and UE ROM, general UE and LE weakness, decreased tolerance for ADLs  Insurance/Certification information:  PT Insurance Information: [employer] - no precert, no deductible  Physician Information:  Referring Practitioner: Terence Lux, MD  Plan of care signed (Y/N): []   Yes [x]   No     Date of Patient follow up with Physician:      Progress Report: []   Yes  [x]   No     Date Range for reporting period:  Beginning: 04/18/19  Ending:     Progress report due (10 Rx/or 30 days whichever is less): visit #10 or 05/19/19 (date)     Recertification due (POC duration/ or 90 days whichever is less): visit #10 or 07/19/19 (date)     Visit # Insurance Allowable Auth required? Date Range   1/10 30 per year  13 used pre COVID []   Yes  [x]   No n/a     Latex Allergy:  [x] NO      [] YES  Preferred Language for Healthcare:   [] English       [x] other: Nepali - pt's daughter stated she would be with her at each session and would like to interpret     Functional Scale:        Date assessed:  NDI: raw score = 36; dysfunction = 72%   04/18/19  Oswestry: raw score = 28; dysfunction = 62%  04/18/19    Pain level:  8/10     SUBJECTIVE:  See eval    OBJECTIVE: See eval      RESTRICTIONS/PRECAUTIONS: Pt very guarded, slow moving     Exercises/Interventions:     Therapeutic Exercises (97110) Resistance / level Sets/sec Reps Notes   Nustep with UEs as able       IB  HR       Standing LE strengthening as tolerated       Active cervical stretching as tolerated                                            Therapeutic Activities (97530)                                          Neuromuscular Re-ed (57846)       CT   CT and rotation  CT and B ER                                           Manual Intervention (97140)  Pt. Education:  -patient educated on diagnosis, prognosis and expectations for rehab  -all patient questions were answered    Home Exercise Program:  10/28: Pt instructed to continue performing HEP from last episode for now       Therapeutic Exercise and NMR EXR  [x]  (97110) Provided verbal/tactile cueing for activities related to strengthening, flexibility, endurance, ROM for improvements in  [x]  LE / Lumbar: LE, proximal hip, and core control with self care, mobility, lifting, ambulation.  []  UE / Cervical: cervical, postural, scapular, scapulothoracic and UE control with self care, reaching, carrying, lifting, house/yardwork, driving, computer work.  []  [phone]) Provided verbal/tactile cueing for activities related to improving balance, coordination, kinesthetic sense, posture, motor skill, proprioception to assist with   []  LE / lumbar: LE, proximal hip, and core control in self care, mobility, lifting, ambulation and eccentric single leg control.   []  UE / cervical: cervical, scapular, scapulothoracic and UE control with self care, reaching, carrying, lifting, house/yardwork, driving, computer work.   []  [phone]) Therapist is in constant attendance of 2 or more patients providing skilled therapy interventions, but not providing any significant amount of measurable one-on-one time to either patient, for improvements in  []  LE / lumbar: LE, proximal hip, and core control in self care, mobility, lifting, ambulation and eccentric single leg control.   []  UE / cervical: cervical, scapular, scapulothoracic and UE control with self care, reaching, carrying, lifting, house/yardwork, driving, computer work.     NMR and Therapeutic Activities:    []  [phone] or  [phone]) Provided verbal/tactile cueing for activities related to improving balance, coordination, kinesthetic sense, posture, motor skill, proprioception and motor activation to allow for proper function of   []  LE: / Lumbar core, proximal hip and LE with self care and ADLs  []  UE / Cervical: cervical, postural, scapular, scapulothoracic and UE control with self care, carrying, lifting, driving, computer work.   []  [phone]) Gait Re-education- Provided training and instruction to the patient for proper LE, core and proximal hip recruitment and positioning and eccentric body weight control with ambulation re-education including up and down stairs     Home Management Training / Self Care:  []  (13086) Provided self-care/home management training related to activities of daily living and compensatory training, and/or use of adaptive equipment for improvement with: ADLs and compensatory training, meal preparation, safety procedures and instruction in use of adaptive equipment, including bathing, grooming, dressing, personal hygiene, basic household cleaning and chores.     Home Exercise Program:    [x]  [phone]) Reviewed/Progressed HEP activities related to strengthening, flexibility, endurance, ROM of   []  LE / Lumbar: core, proximal hip and LE for functional self-care, mobility, lifting and ambulation/stair navigation   []  UE / Cervical: cervical, postural, scapular, scapulothoracic and UE control with self care, reaching, carrying, lifting, house/yardwork, driving, computer work  []  (97112)Reviewed/Progressed HEP activities related to improving balance, coordination, kinesthetic sense, posture, motor skill, proprioception of   []  LE: core, proximal hip and LE for self care, mobility, lifting, and ambulation/stair navigation    []  UE / Cervical: cervical, postural,  scapular, scapulothoracic and UE control with self care, reaching, carrying, lifting, house/yardwork, driving, computer work    Manual Treatments:  PROM / STM /  Oscillations-Mobs:  G-I, II, III, IV (PA's, Inf., Post.)  []  (97140) Provided manual therapy to mobilize LE, proximal hip and/or LS spine soft tissue/joints for the purpose of modulating pain, promoting relaxation,  increasing ROM, reducing/eliminating soft tissue swelling/inflammation/restriction, improving soft tissue extensibility  and allowing for proper ROM for normal function with   []  LE / lumbar: self care, mobility, lifting and ambulation.    []  UE / Cervical: self care, reaching, carrying, lifting, house/yardwork, driving, computer work.     Modalities:  []  (16109) Vasopneumatic compression: Utilized vasopneumatic compression to decrease edema / swelling for the purpose of improving mobility and quad tone / recruitment which will allow for increased overall function including but not limited to self-care, transfers, ambulation, and ascending / descending stairs.       Modalities:  May be a good candidate for mechanical traction; encouraged to use MHP at home     Charges:  Timed Code Treatment Minutes: 18   Total Treatment Minutes: 40     [x]  EVAL - LOW (97161)   []  EVAL - MOD (60454)  []  EVAL - HIGH (97163)  []  RE-EVAL (09811)  [x]  BJ(47829) x  1     []  Ionto  []  NMR (97112) x       []  Vaso  []  Manual (97140) x       []  Ultrasound  []  TA x        []  Mech Traction (56213)  []  Aquatic Therapy x     []  ES (un) (08657):   []  Home Management Training x  []  ES(attended) (84696)   []  Dry Needling 1-2 muscles (29528):  []  Dry Needling 3+ muscles (413244)  []  Group:      []  Other:     GOALS:  Patient stated goal: "to be completely out of pain"   [] ? Progressing: [] ? Met: [] ? Not Met: [] ? Adjusted    Therapist goals for Patient:   Short Term Goals: To be achieved in: 2 weeks  1. Independent in HEP and progression per patient tolerance, in order to prevent re-injury.   [] ? Progressing: [] ? Met: [] ? Not Met: [] ? Adjusted  2. Patient will have a decrease in pain to facilitate improvement in movement, function, and ADLs  as indicated by Functional Deficits.  [] ? Progressing: [] ? Met: [] ? Not Met: [] ? Adjusted    Long Term Goals: To be achieved in: 5 weeks  1. Disability index score of 40% or less for the NDI and Oswestry to assist with reaching prior level of function.   [] ? Progressing: [] ? Met: [] ? Not Met: [] ? Adjusted  2. Patient will demonstrate increased AROM to [facility] of cervical spine to allow for proper joint functioning as indicated by patients Functional Deficits.   [] ? Progressing: [] ? Met: [] ? Not Met: [] ? Adjusted  3. Patient will demonstrate an increase in LE strength to 4+/5 for greater walking tolerance to improve community and social participation.   [] ? Progressing: [] ? Met: [] ? Not Met: [] ? Adjusted  4. Patient will return to functional activities including ability to stand to cook a meal without increased symptoms or restriction.   [] ? Progressing: [] ? Met: [] ? Not Met: [] ? Adjusted  5. Patient will report ability to complete ADLs independently 75% of the time.     [] ? Progressing: [] ? Met: [] ? Not Met: [] ? Adjusted         Overall Progression Towards Functional goals/ Treatment Progress Update:  []  Patient is progressing as expected towards functional goals listed.    []  Progression is slowed due to complexities/Impairments listed.  []  Progression has been slowed due to co-morbidities.  [x]  Plan just implemented, too soon to assess goals progression <30days   []  Goals require adjustment due to lack of progress  []   Patient is not progressing as expected and requires additional follow up with physician  []  Other    Persisting Functional Limitations/Impairments:  [x] Sleeping [x] Sitting               [x] Standing [] Transfers        [x] Walking [] Kneeling               [] Stairs [] Squatting / bending   [] ADLs [x] Reaching  [x] Lifting  [] Housework  [] Driving [] Job related tasks  [] Sports/Recreation [] Other:        ASSESSMENT:  See eval  Treatment/Activity Tolerance:  []  Patient able to complete tx []  Patient limited by  fatigue  [x]  Patient limited by pain  []  Patient limited by other medical complications  []  Other:     Prognosis: []  Good [x]  Fair  []  Poor    Patient Requires Follow-up: [x]  Yes  []  No    Plan for next treatment session:begin with active movement/gentle strengthening as tolerated.     PLAN: See eval. PT 2x / week for 5 weeks.   []  Continue per plan of care []  Alter current plan (see comments)  [x]  Plan of care initiated []  Hold pending MD visit []  Discharge    Electronically signed by: Towanda Octave PT, DPT    Note: If patient does not return for scheduled/ recommended follow up visits, this note will serve as a discharge from care along with most recent update on progress.

## 2019-04-18 NOTE — Plan of Care (Signed)
[place] [address]  Phone: [phone]   Fax:     [phone]                                                       Physical Therapy Certification    Dear Referring Practitioner: [name], MD,    We had the pleasure of evaluating the following patient for physical therapy services at [facility].  A summary of our findings can be found in the initial assessment below.  This includes our plan of care.  If you have any questions or concerns regarding these findings, please do not hesitate to contact me at the office phone number checked above.  Thank you for the referral.       Physician Signature:_______________________________Date:__________________  By signing above (or electronic signature), therapist???s plan is approved by physician      Patient: Faith Mason   DOB: 1976/03/21   MRN: [id number]  Referring Physician: Referring Practitioner: [name], MD      Evaluation Date: 04/18/2019      Medical Diagnosis Information:  Diagnosis: Neck pain; Bilateral shoulder pain, unspecified chronicity; Pain in both lower extremities   Treatment Diagnosis: Increased cervical and LE pain, decreased cervical and UE ROM, general UE and LE weakness, decreased tolerance for ADLs                                         Insurance information: PT Insurance Information: [employer] - no precert, no deductible    Precautions/ Contra-indications: none  Latex Allergy:  '[x]' NO      '[]' YES  Preferred Language for Healthcare:   '[]' English       '[x]' other: Nepali (interpreter required)    SUBJECTIVE: Patient stated complaint: Pt is having neck and shoulder pain as well as leg pain. Her R shoulder/neck is the worst area. She has been having pain for about 2 years . Pt has not had relief with any treatment, even medication. She has some numbness in her hands, mostly along the lateral portion of the little finger up to the neck.  She is also getting a cramping sensation in her arms and legs. She has been using ice at home which provides a small amount of short-term pain relief. Cannot stand for more than 2-3 hours. She did have lumbar surgery in 2017 and states she has had more L side low back pain since the surgery. Pt states "I have a deep pain and I have pain all over my nerves." Pt had an Xray yesterday. Pt was in therapy earlier this year prior to [place] and states she was completing her HEP.     Fear avoidance: I should not do physical activities that (might) make my pain worse   '[x]'  True   '[]'  False     Relevant Medical History: Lumbar surgery 2017  Functional Outcome: NDI: raw score = 36; dysfunction = 72%     Oswestry: raw score = 28; dysfunction = 62%    Pain Scale: 8/10  Easing factors: none  Provocative factors: movement       Type: '[x]' Constant   '[]'   Intermittent  '[]' Radiating '[]' Localized '[]' other:     Numbness/Tingling: hands, mostly along the lateral portion of the little finger up to the neck.    Occupation/School: Unemployed    Living Status/Prior Level of Function: Prior to this injury / incident, pt was independent with ADLs and IADLs. Daughter helps with housework. Patient does not work.     OBJECTIVE:     Functional Mobility/Transfers: Independent    Posture: Fair    Gait: WNL     Dermatomes - NT Normal Abnormal Comments   Top of head (C1)      Posterior occipital region (C2)      Side of neck (C3)      Top of shoulder (C4)      Lateral deltoid (C5)      Tip of thumb (C6)      Distal middle finger (C7)      Distal fifth finger (C8)      Medial forearm (T1)      Lower extremity          Reflexes - NT Normal Abnormal Comments   C5-6 Biceps      C5-6 Brachioradialis      C7-8 Triceps      Hoffman???s      S1-2 Seated achilles      S1-2 Prone knee bend      L3-4 Patellar tendon      Clonus      Babinski          CERV ROM     Cervical Flexion 40    Cervical Extension 40    Cervical SB 25 L 20 R   Cervical rotation 45 L 40 R          ROM Left Right   Shoulder Flex 120 120 *   Shoulder Abd 110 110 *   Shoulder ER C-spine C-spine*   Shoulder IR Lumbar spine Lumbar spine   * = painful          Strength / Myotomes Left Right   Shoulder Shrug (C4)     Shoulder Flex 4- 4-   Shoulder Scap     Shoulder Abduction (C5) 4- 4-   Shoulder ER 4 4-   Shoulder IR 4 4-   Biceps (C6) 4- 4-   Triceps (C7) 4 4   LE MMT:   Left:  Right:  Hip Flexion:   3+  3+   Hip aBduction:  Hip Extension:  Hip IR:    3+  3+  Hip ER:   3+  3+  Knee Extension:  3+  3+  Knee Flexion:    3+  3+    Hamstring flexibility   Lacking 10 Lacking 14     Orthopaedic Special Tests  Positive  Negative  NT Comments    Hautard's        Rhomberg       Sharps-Purser       Cervical Torsion / Body Rotation        C2 Kick       Modified Shear       Compression X      Distraction  X      Quadrant testing - pain on R side with L Flex quadrant          '[x]'  Patient history, allergies, meds reviewed. Medical chart reviewed. See intake form.     Review Of Systems (ROS):  '[x]' Performed Review of systems (Integumentary, CardioPulmonary, Neurological) by intake and  observation. Intake form has been scanned into medical record. Patient has been instructed to contact their primary care physician regarding ROS issues if not already being addressed at this time.      Co-morbidities/Complexities (which will affect course of rehabilitation):  '[]' None           Arthritic conditions   '[]' Rheumatoid arthritis (M05.9)  '[]' Osteoarthritis (M19.91)   Cardiovascular conditions   '[x]' Hypertension (I10)  '[]' Hyperlipidemia (E78.5)  '[]' Angina pectoris (I20)  '[]' Atherosclerosis (I70)   Musculoskeletal conditions   '[]' Disc pathology   '[]' Congenital spine pathologies   '[x]' Prior surgical intervention  '[]' Osteoporosis (M81.8)  '[]' Osteopenia (M85.8)   Endocrine conditions   '[]' Hypothyroid (E03.9)  '[]' Hyperthyroid Gastrointestinal conditions   '[]' Constipation (I33.82)   Metabolic conditions   '[]' Morbid obesity (E66.01)  '[]' Diabetes type 1(E10.65)  or 2 (E11.65)   '[]' Neuropathy (G60.9)     Pulmonary conditions   '[]' Asthma (J45)  '[]' Coughing   '[]' COPD (J44.9)   Psychological Disorders  '[]' Anxiety (F41.9)  '[]' Depression (F32.9)   '[]' Other:   '[]' Other:          Barriers to/and or personal factors that will affect rehab potential:              '[x]' Age  '[]' Sex   '[]' Smoker              '[]' Motivation/Lack of Motivation                        '[]' Co-Morbidities              '[]' Cognitive Function, education/learning barriers              '[x]' Environmental, home barriers              '[]' profession/work barriers  '[]' past PT/medical experience  '[]' other:  Justification:     Falls Risk Assessment (30 days):   '[x]'  Falls Risk assessed and no intervention required.  '[]'  Falls Risk assessed and Patient requires intervention due to being higher risk   TUG score (>12s at risk):     '[]'  Falls education provided, including         ASSESSMENT: Pt referred to physical therapy for pain at multiple locations. Her biggest complaint is R sided neck and shoulder pain. Pt was very guarded throughout the session and slow moving. LE flexibility was WNL, but she demonstrates generalized weakness throughout her UEs and LEs. Pt may be a good candidate for cervical traction. Pt to benefit from skilled therapy services to address her ROM and strength deficits to allow patient to improve functional mobility without pain.    Functional Impairments:     '[]' Noted cervical/thoracic/GHJ joint hypomobility   '[]' Noted cervical/thoracic/GHJ joint hypermobility   '[x]' Decreased cervical/UE functional ROM   '[]' Noted Headache pain aggravated by neck movements with/without dizziness   '[]' Abnormal reflexes/sensation/myotomal/dermatomal deficits   '[x]' Decreased DCF control or ability to hold head up   '[x]' Decreased RC/scapular/core strength and neuromuscular control    '[x]' Decreased UE functional strength   '[x]' other: Decreased LE functional strength      Functional Activity Limitations (from functional questionnaire and  intake)   '[x]' Reduced ability to tolerate prolonged functional positions   '[]' Reduced ability or difficulty with changes of positions or transfers between positions   '[]' Reduced ability to maintain good posture and demonstrate good body mechanics with sitting, bending, and lifting   '[]'  Reduced ability or tolerance with driving and/or computer work   '[x]' Reduced ability to perform lifting, reaching, carrying tasks   '[]' Reduced ability to concentrate   [  x]Reduced ability to sleep    '[]' Reduced ability to tolerate any impact through UE or spine   '[x]' Reduced ability to ambulate prolonged functional periods/distances   '[]' other:    Participation Restrictions   '[x]' Reduced participation in self care activities   '[x]' Reduced participation in home management activities   '[]' Reduced participation in work activities   '[]' Reduced participation in social activities.   '[]' Reduced participation in sport/recreational activities.    Classification/Subgrouping:   '[]' signs/symptoms consistent with neck pain with mobility deficits     '[]' signs/symptoms consistent with neck pain with movement coordinated impairments    '[x]' signs/symptoms consistent with neck pain with radiating pain    '[]' signs/symptoms consistent with neck pain with headaches (cervicogenic)    '[]' Signs/symptoms consistent with nerve root involvement including myotome & dermatome dysfunction   '[]' sign/symptoms consistent with facet dysfunction of cervical and thoracic spine    '[]' signs/symptoms consistent suggesting central cord compression/UMN syndromes   '[]' signs/symptoms consistent with discogenic cervical pain   '[]' signs/symptoms consistent with rib dysfunction   '[]' signs/symptoms consistent with postural dysfunction   '[]' signs/symptoms consistent with shoulder pathology    '[]' signs/symptoms consistent with post-surgical status including decreased ROM, strength and function.   '[]' signs/symptoms consistent with pathology which may benefit from Dry Needling   '[x]' signs/symptoms consistent  with LE pain and weakness      Prognosis/Rehab Potential:      '[]' Excellent   '[]' Good    '[x]' Fair   '[]' Poor    Tolerance of evaluation/treatment:    '[]' Excellent   '[]' Good    '[x]' Fair   '[]' Poor    Physical Therapy Evaluation Complexity Justification  '[x]'  A history of present problem with:  '[]'  no personal factors and/or comorbidities that impact the plan of care;  '[x]' 1-2 personal factors and/or comorbidities that impact the plan of care  '[]' 3 personal factors and/or comorbidities that impact the plan of care  '[x]'  An examination of body systems using standardized tests and measures addressing any of the following: body structures and functions (impairments), activity limitations, and/or participation restrictions;:  '[]'  a total of 1-2 or more elements   '[x]'  a total of 3 or more elements   '[]'  a total of 4 or more elements   '[x]'  A clinical presentation with:  '[x]'  stable and/or uncomplicated characteristics   '[]'  evolving clinical presentation with changing characteristics  '[]'  unstable and unpredictable characteristics;   '[x]'  Clinical decision making of '[x]'  low, '[]'  moderate, '[]'  high complexity using standardized patient assessment instrument and/or measurable assessment of functional outcome.    '[x]'  EVAL (LOW) 97161 (typically 20 minutes face-to-face)  '[]'  EVAL (MOD) 88416 (typically 30 minutes face-to-face)  '[]'  EVAL (HIGH) 97163 (typically 45 minutes face-to-face)  '[]'  RE-EVAL     PLAN:   Frequency/Duration:  2 days per week for 5 Weeks:  Interventions:  '[x]'   Therapeutic exercise including: strength training, ROM, for cervical spine,scapula, core and Upper extremity, including postural re-education.   '[x]'   NMR activation and proprioception for Deep cervical flexors, periscapular and RC muscles and Core, including postural re-education.    '[x]'   Manual therapy as indicated for C/T spine, ribs, Soft tissue to include: Dry Needling/IASTM, STM, PROM, Gr I-IV mobilizations, manipulation.   '[x]'  Modalities as needed that may include:  thermal agents, E-stim, Biofeedback, [place], iontophoresis as indicated  '[x]'  Patient education on joint protection, postural re-education, activity modification, progression of HEP.     HEP instruction: Written HEP instructions provided and reviewed     GOALS:  Patient stated goal: "to be completely out of pain"   '[]'   Progressing: '[]'  Met: '[]'  Not Met: '[]'  Adjusted    Therapist goals for Patient:   Short Term Goals: To be achieved in: 2 weeks  1. Independent in HEP and progression per patient tolerance, in order to prevent re-injury.   '[]'  Progressing: '[]'  Met: '[]'  Not Met: '[]'  Adjusted  2. Patient will have a decrease in pain to facilitate improvement in movement, function, and ADLs as indicated by Functional Deficits.  '[]'  Progressing: '[]'  Met: '[]'  Not Met: '[]'  Adjusted    Long Term Goals: To be achieved in: 5 weeks  1. Disability index score of 40% or less for the NDI and Oswestry to assist with reaching prior level of function.   '[]'  Progressing: '[]'  Met: '[]'  Not Met: '[]'  Adjusted  2. Patient will demonstrate increased AROM to [facility] of cervical spine to allow for proper joint functioning as indicated by patients Functional Deficits.   '[]'  Progressing: '[]'  Met: '[]'  Not Met: '[]'  Adjusted  3. Patient will demonstrate an increase in LE strength to 4+/5 for greater walking tolerance to improve community and social participation.   '[]'  Progressing: '[]'  Met: '[]'  Not Met: '[]'  Adjusted  4. Patient will return to functional activities including ability to stand to cook a meal without increased symptoms or restriction.   '[]'  Progressing: '[]'  Met: '[]'  Not Met: '[]'  Adjusted  5. Patient will report ability to complete ADLs independently 75% of the time.     '[]'  Progressing: '[]'  Met: '[]'  Not Met: '[]'  Adjusted     Electronically signed by:  Corlis Leak, PT DPT

## 2019-04-25 ENCOUNTER — Inpatient Hospital Stay: Admit: 2019-04-25 | Payer: MEDICAID

## 2019-04-25 DIAGNOSIS — M542 Cervicalgia: Secondary | ICD-10-CM

## 2019-04-25 NOTE — Other (Signed)
[facility]  Phone: [phone]   Fax: [phone]    Physical Therapy Daily Treatment Note  Date:  04/25/2019    Patient Name:  Faith Mason    DOB:  08-Dec-1975  MRN: [id number]  Medical/Treatment Diagnosis Information:  ?? Diagnosis: Neck pain; Bilateral shoulder pain, unspecified chronicity; Pain in both lower extremities  Treatment Diagnosis: Increased cervical and LE pain, decreased cervical and UE ROM, general UE and LE weakness, decreased tolerance for ADLs  Insurance/Certification information:  PT Insurance Information: [employer] - no precert, no deductible  Physician Information:  Referring Practitioner: [name], MD  Plan of care signed (Y/N): '[]'   Yes '[x]'   No     Date of Patient follow up with Physician:      Progress Report: '[]'   Yes  '[x]'   No     Date Range for reporting period:  Beginning: 04/18/19  Ending:     Progress report due (10 Rx/or 30 days whichever is less): visit #10 or 93/71/69 (date)     Recertification due (POC duration/ or 90 days whichever is less): visit #10 or 07/19/19 (date)     Visit # Insurance Allowable Auth required? Date Range   2/10 30 per year  13 used pre COVID '[]'   Yes  '[x]'   No n/a     Latex Allergy:  '[x]' NO      '[]' YES  Preferred Language for Healthcare:   '[]' English       '[x]' other: Nepali - pt's daughter stated she would be with her at each session and would like to interpret     Functional Scale:        Date assessed:  NDI: raw score = 36; dysfunction = 72%   04/18/19  Oswestry: raw score = 28; dysfunction = 62%  04/18/19    Pain level:  7/10 Neck pain > hips    SUBJECTIVE:  Pt reports 7/10 pain today in neck, and B hips. Pt reports at home she is having most trouble sleeping, lifting and standing for long periods of time.     OBJECTIVE:        RESTRICTIONS/PRECAUTIONS: Pt very guarded, slow moving     Exercises/Interventions:     Therapeutic Exercises (97110) Resistance / level Sets/sec Reps Notes   Nustep with UEs 1.0  3 min      IB  HR  2  2 30''  10    pulleys  x2 min      HSS at stairs   2 30'' B     upper trap stretch   2 30'' B    Supine Chin tucks   5''  10    Standing 3 way hip       Ball on wall chin tucks  5'' 10    TB mid row ,, shd ext LIME 2 10    Squat at ballet bar  2 10    Standing knee flexion   2 10    Lat stretch at ballet bar   10'' 10                                       Therapeutic Activities (67893)  Neuromuscular Re-ed [phone])       CT   CT and rotation  CT and B ER                                           Manual Intervention (97140)       SOR, cervical pain free PROM, P/A mobs, upper trap stretch  x8 min                                              Pt. Education:  -patient educated on diagnosis, prognosis and expectations for rehab  -all patient questions were answered    Home Exercise Program:  10/28: Pt instructed to continue performing HEP from last episode for now       Therapeutic Exercise and NMR EXR  '[x]'  (97110) Provided verbal/tactile cueing for activities related to strengthening, flexibility, endurance, ROM for improvements in  '[x]'  LE / Lumbar: LE, proximal hip, and core control with self care, mobility, lifting, ambulation.  '[]'  UE / Cervical: cervical, postural, scapular, scapulothoracic and UE control with self care, reaching, carrying, lifting, house/yardwork, driving, computer work.  '[]'  [phone]) Provided verbal/tactile cueing for activities related to improving balance, coordination, kinesthetic sense, posture, motor skill, proprioception to assist with   '[]'  LE / lumbar: LE, proximal hip, and core control in self care, mobility, lifting, ambulation and eccentric single leg control.   '[]'  UE / cervical: cervical, scapular, scapulothoracic and UE control with self care, reaching, carrying, lifting, house/yardwork, driving, computer work.   '[]'  [phone]) Therapist is in constant attendance of 2 or more patients providing skilled therapy  interventions, but not providing any significant amount of measurable one-on-one time to either patient, for improvements in  '[]'  LE / lumbar: LE, proximal hip, and core control in self care, mobility, lifting, ambulation and eccentric single leg control.   '[]'  UE / cervical: cervical, scapular, scapulothoracic and UE control with self care, reaching, carrying, lifting, house/yardwork, driving, computer work.     NMR and Therapeutic Activities:    '[]'  [phone] or [phone]) Provided verbal/tactile cueing for activities related to improving balance, coordination, kinesthetic sense, posture, motor skill, proprioception and motor activation to allow for proper function of   '[]'  LE: / Lumbar core, proximal hip and LE with self care and ADLs  '[]'  UE / Cervical: cervical, postural, scapular, scapulothoracic and UE control with self care, carrying, lifting, driving, computer work.   '[]'  [phone]) Gait Re-education- Provided training and instruction to the patient for proper LE, core and proximal hip recruitment and positioning and eccentric body weight control with ambulation re-education including up and down stairs     Home Management Training / Self Care:  '[]'  (47654) Provided self-care/home management training related to activities of daily living and compensatory training, and/or use of adaptive equipment for improvement with: ADLs and compensatory training, meal preparation, safety procedures and instruction in use of adaptive equipment, including bathing, grooming, dressing, personal hygiene, basic household cleaning and chores.     Home Exercise Program:    '[x]'  [phone]) Reviewed/Progressed HEP activities related to strengthening, flexibility, endurance, ROM of   '[]'  LE / Lumbar: core, proximal hip and LE for functional self-care, mobility, lifting and ambulation/stair navigation   '[]'  UE / Cervical: cervical, postural,  scapular, scapulothoracic and UE control with self care, reaching, carrying, lifting, house/yardwork, driving,  computer work  '[]'  (97112)Reviewed/Progressed HEP activities related to improving balance, coordination, kinesthetic sense, posture, motor skill, proprioception of   '[]'  LE: core, proximal hip and LE for self care, mobility, lifting, and ambulation/stair navigation    '[]'  UE / Cervical: cervical, postural,  scapular, scapulothoracic and UE control with self care, reaching, carrying, lifting, house/yardwork, driving, computer work    Manual Treatments:  PROM / STM / Oscillations-Mobs:  G-I, II, III, IV (PA's, Inf., Post.)  '[x]'  (97140) Provided manual therapy to mobilize LE, proximal hip and/or LS spine soft tissue/joints for the purpose of modulating pain, promoting relaxation,  increasing ROM, reducing/eliminating soft tissue swelling/inflammation/restriction, improving soft tissue extensibility and allowing for proper ROM for normal function with   '[]'  LE / lumbar: self care, mobility, lifting and ambulation.    '[x]'  UE / Cervical: self care, reaching, carrying, lifting, house/yardwork, driving, computer work.     Modalities:  '[]'  (62229) Vasopneumatic compression: Utilized vasopneumatic compression to decrease edema / swelling for the purpose of improving mobility and quad tone / recruitment which will allow for increased overall function including but not limited to self-care, transfers, ambulation, and ascending / descending stairs.       Modalities:  May be a good candidate for mechanical traction; encouraged to use MHP at home     Charges:  Timed Code Treatment Minutes: 42   Total Treatment Minutes: 42     '[]'  EVAL - LOW (97161)   '[]'  EVAL - MOD (79892)  '[]'  EVAL - HIGH (97163)  '[]'  RE-EVAL (11941)  '[x]'  DE(08144) x  2     '[]'  Ionto  '[]'  NMR (81856) x       '[]'  Vaso  '[x]'  Manual (97140) x  1     '[]'  Ultrasound  '[]'  TA x        '[]'  Mech Traction (31497)  '[]'  Aquatic Therapy x     '[]'  ES (un) (02637):   '[]'  Home Management Training x  '[]'  ES(attended) (85885)   '[]'  Dry Needling 1-2 muscles (02774):  '[]'  Dry Needling 3+ muscles  (128786)  '[]'  Group:      '[]'  Other:     GOALS:  Patient stated goal: "to be completely out of pain"   '[]' ? Progressing: '[]' ? Met: '[]' ? Not Met: '[]' ? Adjusted  ??  Therapist goals for Patient:   Short Term Goals: To be achieved in: 2 weeks  1. Independent in HEP and progression per patient tolerance, in order to prevent re-injury.   '[]' ? Progressing: '[]' ? Met: '[]' ? Not Met: '[]' ? Adjusted  2. Patient will have a decrease in pain to facilitate improvement in movement, function, and ADLs as indicated by Functional Deficits.  '[]' ? Progressing: '[]' ? Met: '[]' ? Not Met: '[]' ? Adjusted  ??  Long Term Goals: To be achieved in: 5 weeks  1. Disability index score of 40% or less for the NDI and Oswestry to assist with reaching prior level of function.   '[]' ? Progressing: '[]' ? Met: '[]' ? Not Met: '[]' ? Adjusted  2. Patient will demonstrate increased AROM to [facility] of cervical spine to allow for proper joint functioning as indicated by patients Functional Deficits.   '[]' ? Progressing: '[]' ? Met: '[]' ? Not Met: '[]' ? Adjusted  3. Patient will demonstrate an increase in LE strength to 4+/5 for greater walking tolerance to improve community and social participation.   '[]' ? Progressing: '[]' ? Met: '[]' ? Not Met: '[]' ? Adjusted  4.  Patient will return to functional activities including ability to stand to cook a meal without increased symptoms or restriction.   '[]' ? Progressing: '[]' ? Met: '[]' ? Not Met: '[]' ? Adjusted  5. Patient will report ability to complete ADLs independently 75% of the time.     '[]' ? Progressing: '[]' ? Met: '[]' ? Not Met: '[]' ? Adjusted         Overall Progression Towards Functional goals/ Treatment Progress Update:  '[]'  Patient is progressing as expected towards functional goals listed.    '[]'  Progression is slowed due to complexities/Impairments listed.  '[]'  Progression has been slowed due to co-morbidities.  '[x]'  Plan just implemented, too soon to assess goals progression <30days   '[]'  Goals require adjustment due to lack of progress  '[]'  Patient is not progressing as  expected and requires additional follow up with physician  '[]'  Other    Persisting Functional Limitations/Impairments:  '[x]' Sleeping '[x]' Sitting               '[x]' Standing '[]' Transfers        '[x]' Walking '[]' Kneeling               '[]' Stairs '[]' Squatting / bending   '[]' ADLs '[x]' Reaching  '[x]' Lifting  '[]' Housework  '[]' Driving '[]' Job related tasks  '[]' Sports/Recreation '[]' Other:        ASSESSMENT: Pt tolerated tx well this date with little to no increase in pain with exercises. Pt had decrease in symptoms with manual treatment. Continue to progress UE/LE stretching/ strengthening as tolerated.     Treatment/Activity Tolerance:  '[]'  Patient able to complete tx '[]'  Patient limited by fatigue  '[x]'  Patient limited by pain  '[]'  Patient limited by other medical complications  '[]'  Other:     Prognosis: '[]'  Good '[x]'  Fair  '[]'  Poor    Patient Requires Follow-up: '[x]'  Yes  '[]'  No    Plan for next treatment session:begin with active movement/gentle strengthening as tolerated.     PLAN: See eval. PT 2x / week for 5 weeks.   '[x]'  Continue per plan of care '[]'  Alter current plan (see comments)  '[]'  Plan of care initiated '[]'  Hold pending MD visit '[]'  Discharge    Electronically signed by: Jon Billings PT, DPT    Note: If patient does not return for scheduled/ recommended follow up visits, this note will serve as a discharge from care along with most recent update on progress.

## 2019-04-27 ENCOUNTER — Inpatient Hospital Stay: Admit: 2019-04-27 | Payer: MEDICAID

## 2019-04-27 NOTE — Other (Signed)
[facility]  Phone: [phone]   Fax: [phone]    Physical Therapy Daily Treatment Note  Date:  04/27/2019    Patient Name:  Faith Mason    DOB:  1976-06-14  MRN: [id number]  Medical/Treatment Diagnosis Information:  ?? Diagnosis: Neck pain; Bilateral shoulder pain, unspecified chronicity; Pain in both lower extremities  Treatment Diagnosis: Increased cervical and LE pain, decreased cervical and UE ROM, general UE and LE weakness, decreased tolerance for ADLs  Insurance/Certification information:  PT Insurance Information: [employer] - no precert, no deductible  Physician Information:  Referring Practitioner: [name], MD  Plan of care signed (Y/N): []  Yes [x]  No     Date of Patient follow up with Physician:      Progress Report: []  Yes  [x]  No     Date Range for reporting period:  Beginning: 04/18/19  Ending:     Progress report due (10 Rx/or 30 days whichever is less): visit #10 or 57/84/69 (date)     Recertification due (POC duration/ or 90 days whichever is less): visit #10 or 07/19/19 (date)     Visit # Insurance Allowable Auth required? Date Range   3/10 30 per year  13 used pre COVID []  Yes  [x]  No n/a     Latex Allergy:  [x]NO      []YES  Preferred Language for Healthcare:   []English       [x]other: Nepali - pt's daughter stated she would be with her at each session and would like to interpret     Functional Scale:        Date assessed:  NDI: raw score = 36; dysfunction = 72%   04/18/19  Oswestry: raw score = 28; dysfunction = 62%  04/18/19    Pain level:  9/10 Neck pain > hips    SUBJECTIVE:  Pt reports pain is very bad today, around 9/10 in R upper trap. Pt reports she thinks it was the exercises that increased her pain last visit.     OBJECTIVE:        RESTRICTIONS/PRECAUTIONS: Pt very guarded, slow moving     Exercises/Interventions:     Therapeutic Exercises (97110) Resistance / level Sets/sec Reps Notes   Nustep with UEs 1.0 3 min       IB  HR     pulleys  x2 min      HSS at stairs       upper trap stretch   2 30'' B    Supine Chin tucks   5''  10    Standing 3 way hip       Ball on wall chin tucks     TB mid row ,, shd ext LIME    Squat at ballet bar     Standing knee flexion      Lat stretch at ballet bar      Cervical AROM flex/ext, rotation, SB  x10 each     Wall slides   X10     No money ER orange x15     Wall push ups   x15                                         Therapeutic Activities [phone])  Neuromuscular Re-ed [phone])                                                 Manual Intervention (97140)       SOR, cervical pain free PROM, P/A mobs, upper trap stretch        STM B upper trap trigger point release , strain counter strain x9 min                                      Pt. Education:  -patient educated on diagnosis, prognosis and expectations for rehab  -all patient questions were answered    Home Exercise Program:  10/28: Pt instructed to continue performing HEP from last episode for now       Therapeutic Exercise and NMR EXR  [x] (97110) Provided verbal/tactile cueing for activities related to strengthening, flexibility, endurance, ROM for improvements in  [x] LE / Lumbar: LE, proximal hip, and core control with self care, mobility, lifting, ambulation.  [] UE / Cervical: cervical, postural, scapular, scapulothoracic and UE control with self care, reaching, carrying, lifting, house/yardwork, driving, computer work.  [] [phone]) Provided verbal/tactile cueing for activities related to improving balance, coordination, kinesthetic sense, posture, motor skill, proprioception to assist with   [] LE / lumbar: LE, proximal hip, and core control in self care, mobility, lifting, ambulation and eccentric single leg control.   [] UE / cervical: cervical, scapular, scapulothoracic and UE control with self care, reaching, carrying, lifting, house/yardwork, driving, computer work.   []  [phone]) Therapist is in constant attendance of 2 or more patients providing skilled therapy interventions, but not providing any significant amount of measurable one-on-one time to either patient, for improvements in  [] LE / lumbar: LE, proximal hip, and core control in self care, mobility, lifting, ambulation and eccentric single leg control.   [] UE / cervical: cervical, scapular, scapulothoracic and UE control with self care, reaching, carrying, lifting, house/yardwork, driving, computer work.     NMR and Therapeutic Activities:    [] [phone] or [phone]) Provided verbal/tactile cueing for activities related to improving balance, coordination, kinesthetic sense, posture, motor skill, proprioception and motor activation to allow for proper function of   [] LE: / Lumbar core, proximal hip and LE with self care and ADLs  [] UE / Cervical: cervical, postural, scapular, scapulothoracic and UE control with self care, carrying, lifting, driving, computer work.   [] [phone]) Gait Re-education- Provided training and instruction to the patient for proper LE, core and proximal hip recruitment and positioning and eccentric body weight control with ambulation re-education including up and down stairs     Home Management Training / Self Care:  [] (54008) Provided self-care/home management training related to activities of daily living and compensatory training, and/or use of adaptive equipment for improvement with: ADLs and compensatory training, meal preparation, safety procedures and instruction in use of adaptive equipment, including bathing, grooming, dressing, personal hygiene, basic household cleaning and chores.     Home Exercise Program:    [x] (67619) Reviewed/Progressed HEP activities related to strengthening, flexibility, endurance, ROM of   [] LE / Lumbar: core, proximal hip and LE for functional self-care, mobility, lifting and ambulation/stair navigation   [] UE / Cervical: cervical, postural, scapular,  scapulothoracic and UE control with self care, reaching, carrying, lifting, house/yardwork, driving, computer work  [] (97112)Reviewed/Progressed HEP activities related to improving balance, coordination, kinesthetic sense, posture, motor skill, proprioception of   [] LE: core, proximal hip and LE for self care, mobility, lifting, and ambulation/stair navigation    [] UE / Cervical: cervical, postural,  scapular, scapulothoracic and UE control with self care, reaching, carrying, lifting, house/yardwork, driving, computer work    Manual Treatments:  PROM / STM / Oscillations-Mobs:  G-I, II, III, IV (PA's, Inf., Post.)  [x] (29528) Provided manual therapy to mobilize LE, proximal hip and/or LS spine soft tissue/joints for the purpose of modulating pain, promoting relaxation,  increasing ROM, reducing/eliminating soft tissue swelling/inflammation/restriction, improving soft tissue extensibility and allowing for proper ROM for normal function with   [] LE / lumbar: self care, mobility, lifting and ambulation.    [x] UE / Cervical: self care, reaching, carrying, lifting, house/yardwork, driving, computer work.     Modalities:  [] (41324) Vasopneumatic compression: Utilized vasopneumatic compression to decrease edema / swelling for the purpose of improving mobility and quad tone / recruitment which will allow for increased overall function including but not limited to self-care, transfers, ambulation, and ascending / descending stairs.       Modalities:  May be a good candidate for mechanical traction; encouraged to use MHP at home     Charges:  Timed Code Treatment Minutes: 40   Total Treatment Minutes: 40     [] EVAL - LOW (97161)   [] EVAL - MOD (40102)  [] EVAL - HIGH (72536)  [] RE-EVAL (64403)  [x] KV(42595) x  2     [] Ionto  [] NMR (63875) x       [] Vaso  [x] Manual (64332) x  1     [] Ultrasound  [] TA x        [] Mech Traction (95188)  [] Aquatic Therapy x     [] ES (un) (41660):   [] Home Management Training  x  [] ES(attended) (63016)   [] Dry Needling 1-2 muscles (01093):  [] Dry Needling 3+ muscles (235573)  [] Group:      [] Other:     GOALS:  Patient stated goal: "to be completely out of pain"   []? Progressing: []? Met: []? Not Met: []? Adjusted  ??  Therapist goals for Patient:   Short Term Goals: To be achieved in: 2 weeks  1. Independent in HEP and progression per patient tolerance, in order to prevent re-injury.   []? Progressing: []? Met: []? Not Met: []? Adjusted  2. Patient will have a decrease in pain to facilitate improvement in movement, function, and ADLs as indicated by Functional Deficits.  []? Progressing: []? Met: []? Not Met: []? Adjusted  ??  Long Term Goals: To be achieved in: 5 weeks  1. Disability index score of 40% or less for the NDI and Oswestry to assist with reaching prior level of function.   []? Progressing: []? Met: []? Not Met: []? Adjusted  2. Patient will demonstrate increased AROM to [facility] of cervical spine to allow for proper joint functioning as indicated by patients Functional Deficits.   []? Progressing: []? Met: []? Not Met: []? Adjusted  3. Patient will demonstrate an increase in LE strength to 4+/5 for greater walking tolerance to improve community and social participation.   []? Progressing: []? Met: []? Not Met: []? Adjusted  4. Patient will  return to functional activities including ability to stand to cook a meal without increased symptoms or restriction.   []? Progressing: []? Met: []? Not Met: []? Adjusted  5. Patient will report ability to complete ADLs independently 75% of the time.     []? Progressing: []? Met: []? Not Met: []? Adjusted         Overall Progression Towards Functional goals/ Treatment Progress Update:  [] Patient is progressing as expected towards functional goals listed.    [] Progression is slowed due to complexities/Impairments listed.  [] Progression has been slowed due to co-morbidities.  [x] Plan just implemented, too soon to assess goals progression  <30days   [] Goals require adjustment due to lack of progress  [] Patient is not progressing as expected and requires additional follow up with physician  [] Other    Persisting Functional Limitations/Impairments:  [x]Sleeping [x]Sitting               [x]Standing []Transfers        [x]Walking []Kneeling               []Stairs []Squatting / bending   []ADLs [x]Reaching  [x]Lifting  []Housework  []Driving []Job related tasks  []Sports/Recreation []Other:        ASSESSMENT:  Progressions added in bold above. Pt very irritable with exercises and movements of cervical spine today. Pain free ROM encouraged this date with gentle strengthening. Possibly try mechanical traction next visit.     Treatment/Activity Tolerance:  [] Patient able to complete tx [] Patient limited by fatigue  [x] Patient limited by pain  [] Patient limited by other medical complications  [] Other:     Prognosis: [] Good [x] Fair  [] Poor    Patient Requires Follow-up: [x] Yes  [] No    Plan for next treatment session:begin with active movement/gentle strengthening as tolerated.     PLAN: See eval. PT 2x / week for 5 weeks.   [x] Continue per plan of care [] Alter current plan (see comments)  [] Plan of care initiated [] Hold pending MD visit [] Discharge    Electronically signed by: Jon Billings PT, DPT    Note: If patient does not return for scheduled/ recommended follow up visits, this note will serve as a discharge from care along with most recent update on progress.

## 2019-05-02 ENCOUNTER — Inpatient Hospital Stay: Admit: 2019-05-02 | Payer: MEDICAID

## 2019-05-02 NOTE — Other (Signed)
[facility]  Phone: [phone]   Fax: [phone]    Physical Therapy Daily Treatment Note  Date:  05/02/2019    Patient Name:  Faith Mason    DOB:  09-18-1975  MRN: [id number]  Medical/Treatment Diagnosis Information:  ?? Diagnosis: Neck pain; Bilateral shoulder pain, unspecified chronicity; Pain in both lower extremities  Treatment Diagnosis: Increased cervical and LE pain, decreased cervical and UE ROM, general UE and LE weakness, decreased tolerance for ADLs  Insurance/Certification information:  PT Insurance Information: [employer] - no precert, no deductible  Physician Information:  Referring Practitioner: [name], MD  Plan of care signed (Y/N): '[x]'   Yes '[]'   No     Date of Patient follow up with Physician:      Progress Report: '[]'   Yes  '[x]'   No     Date Range for reporting period:  Beginning: 04/18/19  Ending:     Progress report due (10 Rx/or 30 days whichever is less): visit #10 or 29/92/42 (date)     Recertification due (POC duration/ or 90 days whichever is less): visit #10 or 07/19/19 (date)     Visit # Insurance Allowable Auth required? Date Range   4/10 30 per year  13 used pre COVID '[]'   Yes  '[x]'   No n/a     Latex Allergy:  '[x]' NO      '[]' YES  Preferred Language for Healthcare:   '[]' English       '[x]' other: Nepali - pt's daughter stated she would be with her at each session and would like to interpret     Functional Scale:        Date assessed:  NDI: raw score = 36; dysfunction = 72%   04/18/19  Oswestry: raw score = 28; dysfunction = 62%  04/18/19    Pain level:  6/10 Neck pain > hips    SUBJECTIVE:  Pt reports less pain today, she is feeling better.     OBJECTIVE:        RESTRICTIONS/PRECAUTIONS: Pt very guarded, slow moving     Exercises/Interventions:     Therapeutic Exercises (97110) Resistance / level Sets/sec Reps Notes   Nustep with UEs 1.0 3 min      IB  HR  2  2 30''  10    pulleys  x2 min      HSS at stairs   2 30'' B     upper trap  stretch      Supine Chin tucks      Standing 3 way hip       Ball on wall chin tucks     TB mid row ,, shd ext LIME 2 10    Squat at ballet bar     Standing knee flexion      Lat stretch at ballet bar      Cervical AROM flex/ext, rotation, SB      Wall slides       No money ER orange     Wall push ups   x15     Bridge   x15     SLR flex , ABD, ext   x10 B  each   LAQ  1x15 B     TG squats  x20  Therapeutic Activities [phone])       Step ups fwd, lateral  6 in  1 10 B                                                     Neuromuscular Re-ed (54008)       CC: LPD  Mid row  multifidi row                                           Manual Intervention (97140)       SOR, cervical pain free PROM, P/A mobs, upper trap stretch        STM B upper trap trigger point release , strain counter strain                                       Pt. Education:  -patient educated on diagnosis, prognosis and expectations for rehab  -all patient questions were answered    Home Exercise Program:  10/28: Pt instructed to continue performing HEP from last episode for now       Therapeutic Exercise and NMR EXR  '[x]'  (97110) Provided verbal/tactile cueing for activities related to strengthening, flexibility, endurance, ROM for improvements in  '[x]'  LE / Lumbar: LE, proximal hip, and core control with self care, mobility, lifting, ambulation.  '[]'  UE / Cervical: cervical, postural, scapular, scapulothoracic and UE control with self care, reaching, carrying, lifting, house/yardwork, driving, computer work.  '[]'  [phone]) Provided verbal/tactile cueing for activities related to improving balance, coordination, kinesthetic sense, posture, motor skill, proprioception to assist with   '[]'  LE / lumbar: LE, proximal hip, and core control in self care, mobility, lifting, ambulation and eccentric single leg control.   '[]'  UE / cervical: cervical, scapular, scapulothoracic and UE control with self care, reaching, carrying, lifting,  house/yardwork, driving, computer work.   '[]'  [phone]) Therapist is in constant attendance of 2 or more patients providing skilled therapy interventions, but not providing any significant amount of measurable one-on-one time to either patient, for improvements in  '[]'  LE / lumbar: LE, proximal hip, and core control in self care, mobility, lifting, ambulation and eccentric single leg control.   '[]'  UE / cervical: cervical, scapular, scapulothoracic and UE control with self care, reaching, carrying, lifting, house/yardwork, driving, computer work.     NMR and Therapeutic Activities:    '[]'  [phone] or [phone]) Provided verbal/tactile cueing for activities related to improving balance, coordination, kinesthetic sense, posture, motor skill, proprioception and motor activation to allow for proper function of   '[]'  LE: / Lumbar core, proximal hip and LE with self care and ADLs  '[]'  UE / Cervical: cervical, postural, scapular, scapulothoracic and UE control with self care, carrying, lifting, driving, computer work.   '[]'  [phone]) Gait Re-education- Provided training and instruction to the patient for proper LE, core and proximal hip recruitment and positioning and eccentric body weight control with ambulation re-education including up and down stairs     Home Management Training / Self Care:  '[]'  (50539) Provided self-care/home management training related to activities of daily living and compensatory training, and/or use of adaptive equipment for  improvement with: ADLs and compensatory training, meal preparation, safety procedures and instruction in use of adaptive equipment, including bathing, grooming, dressing, personal hygiene, basic household cleaning and chores.     Home Exercise Program:    '[x]'  [phone]) Reviewed/Progressed HEP activities related to strengthening, flexibility, endurance, ROM of   '[]'  LE / Lumbar: core, proximal hip and LE for functional self-care, mobility, lifting and ambulation/stair navigation   '[]'  UE /  Cervical: cervical, postural, scapular, scapulothoracic and UE control with self care, reaching, carrying, lifting, house/yardwork, driving, computer work  '[]'  (97112)Reviewed/Progressed HEP activities related to improving balance, coordination, kinesthetic sense, posture, motor skill, proprioception of   '[]'  LE: core, proximal hip and LE for self care, mobility, lifting, and ambulation/stair navigation    '[]'  UE / Cervical: cervical, postural,  scapular, scapulothoracic and UE control with self care, reaching, carrying, lifting, house/yardwork, driving, computer work    Manual Treatments:  PROM / STM / Oscillations-Mobs:  G-I, II, III, IV (PA's, Inf., Post.)  '[]'  (97140) Provided manual therapy to mobilize LE, proximal hip and/or LS spine soft tissue/joints for the purpose of modulating pain, promoting relaxation,  increasing ROM, reducing/eliminating soft tissue swelling/inflammation/restriction, improving soft tissue extensibility and allowing for proper ROM for normal function with   '[]'  LE / lumbar: self care, mobility, lifting and ambulation.    '[]'  UE / Cervical: self care, reaching, carrying, lifting, house/yardwork, driving, computer work.     Modalities:  '[]'  (12751) Vasopneumatic compression: Utilized vasopneumatic compression to decrease edema / swelling for the purpose of improving mobility and quad tone / recruitment which will allow for increased overall function including but not limited to self-care, transfers, ambulation, and ascending / descending stairs.       Modalities:  May be a good candidate for mechanical traction; encouraged to use MHP at home     Charges:  Timed Code Treatment Minutes: 44   Total Treatment Minutes: 44     '[]'  EVAL - LOW (97161)   '[]'  EVAL - MOD (70017)  '[]'  EVAL - HIGH (97163)  '[]'  RE-EVAL (49449)  '[x]'  QP(59163) x  2     '[]'  Ionto  '[]'  NMR (84665) x       '[]'  Vaso  '[]'  Manual (97140) x  1     '[]'  Ultrasound  '[x]'  TA x 1       '[]'  Mech Traction (99357)  '[]'  Aquatic Therapy x     '[]'  ES (un)  (97014):   '[]'  Home Management Training x  '[]'  ES(attended) (01779)   '[]'  Dry Needling 1-2 muscles (39030):  '[]'  Dry Needling 3+ muscles (092330)  '[]'  Group:      '[]'  Other:     GOALS:  Patient stated goal: "to be completely out of pain"   '[]' ? Progressing: '[]' ? Met: '[]' ? Not Met: '[]' ? Adjusted  ??  Therapist goals for Patient:   Short Term Goals: To be achieved in: 2 weeks  1. Independent in HEP and progression per patient tolerance, in order to prevent re-injury.   '[]' ? Progressing: '[]' ? Met: '[]' ? Not Met: '[]' ? Adjusted  2. Patient will have a decrease in pain to facilitate improvement in movement, function, and ADLs as indicated by Functional Deficits.  '[]' ? Progressing: '[]' ? Met: '[]' ? Not Met: '[]' ? Adjusted  ??  Long Term Goals: To be achieved in: 5 weeks  1. Disability index score of 40% or less for the NDI and Oswestry to assist with reaching prior level of function.   '[]' ? Progressing: '[]' ?  Met: '[]' ? Not Met: '[]' ? Adjusted  2. Patient will demonstrate increased AROM to [facility] of cervical spine to allow for proper joint functioning as indicated by patients Functional Deficits.   '[]' ? Progressing: '[]' ? Met: '[]' ? Not Met: '[]' ? Adjusted  3. Patient will demonstrate an increase in LE strength to 4+/5 for greater walking tolerance to improve community and social participation.   '[]' ? Progressing: '[]' ? Met: '[]' ? Not Met: '[]' ? Adjusted  4. Patient will return to functional activities including ability to stand to cook a meal without increased symptoms or restriction.   '[]' ? Progressing: '[]' ? Met: '[]' ? Not Met: '[]' ? Adjusted  5. Patient will report ability to complete ADLs independently 75% of the time.     '[]' ? Progressing: '[]' ? Met: '[]' ? Not Met: '[]' ? Adjusted         Overall Progression Towards Functional goals/ Treatment Progress Update:  '[]'  Patient is progressing as expected towards functional goals listed.    '[]'  Progression is slowed due to complexities/Impairments listed.  '[]'  Progression has been slowed due to co-morbidities.  '[x]'  Plan just implemented,  too soon to assess goals progression <30days   '[]'  Goals require adjustment due to lack of progress  '[]'  Patient is not progressing as expected and requires additional follow up with physician  '[]'  Other    Persisting Functional Limitations/Impairments:  '[x]' Sleeping '[x]' Sitting               '[x]' Standing '[]' Transfers        '[x]' Walking '[]' Kneeling               '[]' Stairs '[]' Squatting / bending   '[]' ADLs '[x]' Reaching  '[x]' Lifting  '[]' Housework  '[]' Driving '[]' Job related tasks  '[]' Sports/Recreation '[]' Other:        ASSESSMENT:  Focus of tx on LE this date, pt performed well but did fatigue at the end. Pt requiring cues to not lock out knees into hyperextension. Continue to progress as tolerated.     Treatment/Activity Tolerance:  '[]'  Patient able to complete tx '[]'  Patient limited by fatigue  '[x]'  Patient limited by pain  '[]'  Patient limited by other medical complications  '[]'  Other:     Prognosis: '[]'  Good '[x]'  Fair  '[]'  Poor    Patient Requires Follow-up: '[x]'  Yes  '[]'  No    Plan for next treatment session:begin with active movement/gentle strengthening as tolerated.     PLAN: See eval. PT 2x / week for 5 weeks.   '[x]'  Continue per plan of care '[]'  Alter current plan (see comments)  '[]'  Plan of care initiated '[]'  Hold pending MD visit '[]'  Discharge    Electronically signed by: Jon Billings PT, DPT    Note: If patient does not return for scheduled/ recommended follow up visits, this note will serve as a discharge from care along with most recent update on progress.

## 2019-05-04 ENCOUNTER — Inpatient Hospital Stay: Admit: 2019-05-04 | Payer: MEDICAID

## 2019-05-04 NOTE — Other (Signed)
[facility]  Phone: [phone]   Fax: [phone]    Physical Therapy Daily Treatment Note  Date:  05/04/2019    Patient Name:  Faith Mason    DOB:  1975/09/26  MRN: [id number]  Medical/Treatment Diagnosis Information:  ?? Diagnosis: Neck pain; Bilateral shoulder pain, unspecified chronicity; Pain in both lower extremities  Treatment Diagnosis: Increased cervical and LE pain, decreased cervical and UE ROM, general UE and LE weakness, decreased tolerance for ADLs  Insurance/Certification information:  PT Insurance Information: [employer] - no precert, no deductible  Physician Information:  Referring Practitioner: [name], MD   Plan of care signed (Y/N): '[x]'$   Yes '[]'$   No     Date of Patient follow up with Physician:      Progress Report: '[]'$   Yes  '[x]'$   No     Date Range for reporting period:  Beginning: 04/18/19  Ending:     Progress report due (10 Rx/or 30 days whichever is less): visit #10 or 03/54/65 (date)     Recertification due (POC duration/ or 90 days whichever is less): visit #10 or 07/19/19 (date)     Visit # Insurance Allowable Auth required? Date Range   5/10 30 per year  13 used pre COVID '[]'$   Yes  '[x]'$   No n/a     Latex Allergy:  '[x]'$ NO      '[]'$ YES  Preferred Language for Healthcare:   '[]'$ English       '[x]'$ other: Nepali - pt's daughter stated she would be with her at each session and would like to interpret     Functional Scale:        Date assessed:  NDI: raw score = 36; dysfunction = 72%   04/18/19  Oswestry: raw score = 28; dysfunction = 62%  04/18/19    Pain level:  5-6/10 Neck pain > hips    SUBJECTIVE: Pt denied interpreter phone this date, daughter present.    Pt reports her neck pain is still worse than her hip pain. Pt reports she doesn't have that much pain in her legs but still has tingling all the way down to her feet. Pt states she has been using a MHP at home but it doesn't really seem to be helping.      OBJECTIVE:         RESTRICTIONS/PRECAUTIONS: Pt very guarded, slow moving     Exercises/Interventions:     Therapeutic Exercises (97110) Resistance / level Sets/sec Reps Notes   Nustep with UEs 1.0 3 min      IB  HR  2  2 30''  10    pulleys  x2 min      HSS at stairs   2 30'' B     upper trap stretch      Supine Chin tucks      Standing 3 way hip       Ball on wall chin tucks     TB mid row ,, shd ext LIME 2 10    Squat at ballet bar     Standing knee flexion      Lat stretch at ballet bar   Thread the needle at ballet bar  20 sec  '1 3  10 '$ B    Cervical AROM flex/ext, rotation, SB      Wall slides       No money ER orange     Wall push ups  Bridge        SLR flex , ABD, ext   1 X 10 B each   LAQ     TG squats  2 10                                Therapeutic Activities (78676)       Step ups fwd, lateral  6 in  1 10 B Cues for sequencing                                                    Neuromuscular Re-ed (72094)       CC: LPD  Mid row  multifidi row                                           Manual Intervention (97140)       SOR, cervical pain free PROM, P/A mobs, upper trap stretch        STM B upper trap trigger point release, strain counter strain                                       Pt. Education:  -patient educated on diagnosis, prognosis and expectations for rehab  -all patient questions were answered    Home Exercise Program:  10/28: Pt instructed to continue performing HEP from last episode for now       Therapeutic Exercise and NMR EXR  '[x]'$  (97110) Provided verbal/tactile cueing for activities related to strengthening, flexibility, endurance, ROM for improvements in  '[x]'$  LE / Lumbar: LE, proximal hip, and core control with self care, mobility, lifting, ambulation.  '[]'$  UE / Cervical: cervical, postural, scapular, scapulothoracic and UE control with self care, reaching, carrying, lifting, house/yardwork, driving, computer work.  '[]'$  [phone]) Provided verbal/tactile cueing for activities related to improving balance,  coordination, kinesthetic sense, posture, motor skill, proprioception to assist with   '[]'$  LE / lumbar: LE, proximal hip, and core control in self care, mobility, lifting, ambulation and eccentric single leg control.   '[]'$  UE / cervical: cervical, scapular, scapulothoracic and UE control with self care, reaching, carrying, lifting, house/yardwork, driving, computer work.   '[]'$  [phone]) Therapist is in constant attendance of 2 or more patients providing skilled therapy interventions, but not providing any significant amount of measurable one-on-one time to either patient, for improvements in  '[]'$  LE / lumbar: LE, proximal hip, and core control in self care, mobility, lifting, ambulation and eccentric single leg control.   '[]'$  UE / cervical: cervical, scapular, scapulothoracic and UE control with self care, reaching, carrying, lifting, house/yardwork, driving, computer work.     NMR and Therapeutic Activities:    '[]'$  [phone] or 97530) Provided verbal/tactile cueing for activities related to improving balance, coordination, kinesthetic sense, posture, motor skill, proprioception and motor activation to allow for proper function of   '[]'$  LE: / Lumbar core, proximal hip and LE with self care and ADLs  '[]'$  UE / Cervical: cervical, postural, scapular, scapulothoracic and UE control with self care, carrying, lifting, driving, computer work.   '[]'$  [phone])  Gait Re-education- Provided training and instruction to the patient for proper LE, core and proximal hip recruitment and positioning and eccentric body weight control with ambulation re-education including up and down stairs     Home Management Training / Self Care:  '[]'$  (42353) Provided self-care/home management training related to activities of daily living and compensatory training, and/or use of adaptive equipment for improvement with: ADLs and compensatory training, meal preparation, safety procedures and instruction in use of adaptive equipment, including bathing, grooming,  dressing, personal hygiene, basic household cleaning and chores.     Home Exercise Program:    '[]'$  [phone]) Reviewed/Progressed HEP activities related to strengthening, flexibility, endurance, ROM of   '[]'$  LE / Lumbar: core, proximal hip and LE for functional self-care, mobility, lifting and ambulation/stair navigation   '[]'$  UE / Cervical: cervical, postural, scapular, scapulothoracic and UE control with self care, reaching, carrying, lifting, house/yardwork, driving, computer work  '[]'$  (97112)Reviewed/Progressed HEP activities related to improving balance, coordination, kinesthetic sense, posture, motor skill, proprioception of   '[]'$  LE: core, proximal hip and LE for self care, mobility, lifting, and ambulation/stair navigation    '[]'$  UE / Cervical: cervical, postural,  scapular, scapulothoracic and UE control with self care, reaching, carrying, lifting, house/yardwork, driving, computer work    Manual Treatments:  PROM / STM / Oscillations-Mobs:  G-I, II, III, IV (PA's, Inf., Post.)  '[]'$  (97140) Provided manual therapy to mobilize LE, proximal hip and/or LS spine soft tissue/joints for the purpose of modulating pain, promoting relaxation,  increasing ROM, reducing/eliminating soft tissue swelling/inflammation/restriction, improving soft tissue extensibility and allowing for proper ROM for normal function with   '[]'$  LE / lumbar: self care, mobility, lifting and ambulation.    '[]'$  UE / Cervical: self care, reaching, carrying, lifting, house/yardwork, driving, computer work.     Modalities:  '[]'$  (15400) Vasopneumatic compression: Utilized vasopneumatic compression to decrease edema / swelling for the purpose of improving mobility and quad tone / recruitment which will allow for increased overall function including but not limited to self-care, transfers, ambulation, and ascending / descending stairs.       Modalities:  05/04/19 Pt was set up on lumbar traction in supine with bolsters under B knees with parameters of 60/45 lbs  with on/off time of 30/10, for a total time of 10 minutes. Pt was given panic button as well as instructed how to use it if experiencing pain as well as given bell to call for needs.       Charges:  Timed Code Treatment Minutes: 35   Total Treatment Minutes: 45     '[]'$  EVAL - LOW (97161)   '[]'$  EVAL - MOD (86761)  '[]'$  EVAL - HIGH (97163)  '[]'$  RE-EVAL (95093)  '[x]'$  OI(71245) x  2     '[]'$  Ionto  '[]'$  NMR (80998) x       '[]'$  Vaso  '[]'$  Manual (97140) x       '[]'$  Ultrasound  '[]'$  TA x        '[x]'$  Mech Traction (33825)  '[]'$  Aquatic Therapy x      '[]'$  ES (un) (05397):   '[]'$  Home Management Training x  '[]'$  ES(attended) (67341)   '[]'$  Dry Needling 1-2 muscles (93790):  '[]'$  Dry Needling 3+ muscles (240973)  '[]'$  Group:      '[]'$  Other:     GOALS:  Patient stated goal: "to be completely out of pain"   '[]'$ ? Progressing: '[]'$ ? Met: '[]'$ ? Not Met: '[]'$ ? Adjusted  ??  Therapist  goals for Patient:   Short Term Goals: To be achieved in: 2 weeks  1. Independent in HEP and progression per patient tolerance, in order to prevent re-injury.   '[]'$ ? Progressing: '[]'$ ? Met: '[]'$ ? Not Met: '[]'$ ? Adjusted  2. Patient will have a decrease in pain to facilitate improvement in movement, function, and ADLs as indicated by Functional Deficits.  '[]'$ ? Progressing: '[]'$ ? Met: '[]'$ ? Not Met: '[]'$ ? Adjusted  ??  Long Term Goals: To be achieved in: 5 weeks  1. Disability index score of 40% or less for the NDI and Oswestry to assist with reaching prior level of function.   '[]'$ ? Progressing: '[]'$ ? Met: '[]'$ ? Not Met: '[]'$ ? Adjusted  2. Patient will demonstrate increased AROM to [facility] of cervical spine to allow for proper joint functioning as indicated by patients Functional Deficits.   '[]'$ ? Progressing: '[]'$ ? Met: '[]'$ ? Not Met: '[]'$ ? Adjusted  3. Patient will demonstrate an increase in LE strength to 4+/5 for greater walking tolerance to improve community and social participation.   '[]'$ ? Progressing: '[]'$ ? Met: '[]'$ ? Not Met: '[]'$ ? Adjusted  4. Patient will return to functional activities including ability to stand to cook a  meal without increased symptoms or restriction.   '[]'$ ? Progressing: '[]'$ ? Met: '[]'$ ? Not Met: '[]'$ ? Adjusted  5. Patient will report ability to complete ADLs independently 75% of the time.     '[]'$ ? Progressing: '[]'$ ? Met: '[]'$ ? Not Met: '[]'$ ? Adjusted         Overall Progression Towards Functional goals/ Treatment Progress Update:  '[]'$  Patient is progressing as expected towards functional goals listed.    '[]'$  Progression is slowed due to complexities/Impairments listed.  '[]'$  Progression has been slowed due to co-morbidities.  '[x]'$  Plan just implemented, too soon to assess goals progression <30days   '[]'$  Goals require adjustment due to lack of progress  '[]'$  Patient is not progressing as expected and requires additional follow up with physician  '[]'$  Other    Persisting Functional Limitations/Impairments:  '[x]'$ Sleeping '[x]'$ Sitting               '[x]'$ Standing '[]'$ Transfers        '[x]'$ Walking '[]'$ Kneeling               '[]'$ Stairs '[]'$ Squatting / bending   '[]'$ ADLs '[x]'$ Reaching  '[x]'$ Lifting  '[]'$ Housework  '[]'$ Driving '[]'$ Job related tasks  '[]'$ Sports/Recreation '[]'$ Other:        ASSESSMENT:  Pt reported less back pain but no change in tingling in LEs immediately following mechanical lumbar traction. Pt appears to be making progress overall with pain levels and general mobility, will continue to progress strength and flexibility as able.     Treatment/Activity Tolerance:  '[]'$  Patient able to complete tx '[]'$  Patient limited by fatigue  '[x]'$  Patient limited by pain  '[]'$  Patient limited by other medical complications  '[]'$  Other:     Prognosis: '[]'$  Good '[x]'$  Fair  '[]'$  Poor    Patient Requires Follow-up: '[x]'$  Yes  '[]'$  No    Plan for next treatment session:begin with active movement/gentle strengthening as tolerated.     PLAN: See eval. PT 2x / week for 5 weeks.   '[x]'$  Continue per plan of care '[]'$  Alter current plan (see comments)  '[]'$  Plan of care initiated '[]'$  Hold pending MD visit '[]'$  Discharge    Electronically signed by: Corlis Leak PT, DPT    Note: If patient does not return  for scheduled/ recommended follow up visits, this note will serve as a discharge from care along with most  recent update on progress.

## 2019-05-09 ENCOUNTER — Inpatient Hospital Stay: Admit: 2019-05-09 | Payer: MEDICAID

## 2019-05-09 NOTE — Other (Signed)
[facility]  Phone: [phone]   Fax: [phone]    Physical Therapy Daily Treatment Note  Date:  05/09/2019    Patient Name:  Faith Mason    DOB:  Nov 18, 1975  MRN: [id number]  Medical/Treatment Diagnosis Information:  ?? Diagnosis: Neck pain; Bilateral shoulder pain, unspecified chronicity; Pain in both lower extremities  Treatment Diagnosis: Increased cervical and LE pain, decreased cervical and UE ROM, general UE and LE weakness, decreased tolerance for ADLs  Insurance/Certification information:  PT Insurance Information: [employer] - no precert, no deductible  Physician Information:  Referring Practitioner: [name], MD   Plan of care signed (Y/N): [x]  Yes []  No     Date of Patient follow up with Physician:      Progress Report: []  Yes  [x]  No     Date Range for reporting period:  Beginning: 04/18/19  Ending:     Progress report due (10 Rx/or 30 days whichever is less): visit #10 or 16/60/63 (date)     Recertification due (POC duration/ or 90 days whichever is less): visit #10 or 07/19/19 (date)     Visit # Insurance Allowable Auth required? Date Range   6/10 30 per year  13 used pre COVID []  Yes  [x]  No n/a     Latex Allergy:  [x]NO      []YES  Preferred Language for Healthcare:   []English       [x]other: Nepali - pt's daughter stated she would be with her at each session and would like to interpret     Functional Scale:        Date assessed:  NDI: raw score = 36; dysfunction = 72%   04/18/19  Oswestry: raw score = 28; dysfunction = 62%  04/18/19    Pain level:  5-6/10 R Shoulder Burning/tingling in legs (Shoulder pain > hips)    SUBJECTIVE: Pt denied interpreter phone this date, daughter present.  Pt reports that the pain in her R shoulder is worse than the pain in her hips today. Although, Pt reports numbness and tingling sensation traveling down B LEs all the way down to the feet. Pt reports no setbacks since last visit.       OBJECTIVE:         RESTRICTIONS/PRECAUTIONS: Pt very guarded, slow moving     Exercises/Interventions:     Therapeutic Exercises (97110) Resistance / level Sets/sec Reps Notes   Nustep with UEs 1.0 3 min      IB  HR  2  2 30''  10    pulleys  x2 min      HSS at stairs   2 30'' B     upper trap stretch      Supine Chin tucks      Standing 3 way hip       Ball on wall chin tucks     TB mid row / shd ext LIME 2 10    Squat at ballet bar     Standing knee flexion      Lat stretch at ballet bar   Thread the needle at ballet bar  20 sec  _0 B    Cervical AROM flex/ext, rotation, SB      Wall slides       No money ER orange     Wall push ups       Bridge  SLR flex , ABD, ext   1 X 10 B each   LAQ     TG squats                                 Therapeutic Activities (97530)       Step ups fwd, lateral  6 in  1 10 B Cues for sequencing                                                    Neuromuscular Re-ed (42595)       CC: LPD  Mid row  multifidi row                                           Manual Intervention (97140)       SOR, cervical pain free PROM, P/A mobs, upper trap stretch        STM B upper trap trigger point release, strain counter strain                                       Pt. Education:  -patient educated on diagnosis, prognosis and expectations for rehab  -all patient questions were answered    Home Exercise Program:  10/28: Pt instructed to continue performing HEP from last episode for now       Therapeutic Exercise and NMR EXR  [x] (97110) Provided verbal/tactile cueing for activities related to strengthening, flexibility, endurance, ROM for improvements in  [x] LE / Lumbar: LE, proximal hip, and core control with self care, mobility, lifting, ambulation.  [] UE / Cervical: cervical, postural, scapular, scapulothoracic and UE control with self care, reaching, carrying, lifting, house/yardwork, driving, computer work.  [] [phone]) Provided verbal/tactile cueing for activities related to improving balance,  coordination, kinesthetic sense, posture, motor skill, proprioception to assist with   [] LE / lumbar: LE, proximal hip, and core control in self care, mobility, lifting, ambulation and eccentric single leg control.   [] UE / cervical: cervical, scapular, scapulothoracic and UE control with self care, reaching, carrying, lifting, house/yardwork, driving, computer work.   [] [phone]) Therapist is in constant attendance of 2 or more patients providing skilled therapy interventions, but not providing any significant amount of measurable one-on-one time to either patient, for improvements in  [] LE / lumbar: LE, proximal hip, and core control in self care, mobility, lifting, ambulation and eccentric single leg control.   [] UE / cervical: cervical, scapular, scapulothoracic and UE control with self care, reaching, carrying, lifting, house/yardwork, driving, computer work.     NMR and Therapeutic Activities:    [] (95188 or 41660) Provided verbal/tactile cueing for activities related to improving balance, coordination, kinesthetic sense, posture, motor skill, proprioception and motor activation to allow for proper function of   [] LE: / Lumbar core, proximal hip and LE with self care and ADLs  [] UE / Cervical: cervical, postural, scapular, scapulothoracic and UE control with self care, carrying, lifting, driving, computer work.   [] (63016) Gait Re-education- Provided training and instruction to the patient for  proper LE, core and proximal hip recruitment and positioning and eccentric body weight control with ambulation re-education including up and down stairs     Home Management Training / Self Care:  [] (19379) Provided self-care/home management training related to activities of daily living and compensatory training, and/or use of adaptive equipment for improvement with: ADLs and compensatory training, meal preparation, safety procedures and instruction in use of adaptive equipment, including bathing, grooming,  dressing, personal hygiene, basic household cleaning and chores.     Home Exercise Program:    [] [phone]) Reviewed/Progressed HEP activities related to strengthening, flexibility, endurance, ROM of   [] LE / Lumbar: core, proximal hip and LE for functional self-care, mobility, lifting and ambulation/stair navigation   [] UE / Cervical: cervical, postural, scapular, scapulothoracic and UE control with self care, reaching, carrying, lifting, house/yardwork, driving, computer work  [] (97112)Reviewed/Progressed HEP activities related to improving balance, coordination, kinesthetic sense, posture, motor skill, proprioception of   [] LE: core, proximal hip and LE for self care, mobility, lifting, and ambulation/stair navigation    [] UE / Cervical: cervical, postural,  scapular, scapulothoracic and UE control with self care, reaching, carrying, lifting, house/yardwork, driving, computer work    Manual Treatments:  PROM / STM / Oscillations-Mobs:  G-I, II, III, IV (PA's, Inf., Post.)  [] (73532) Provided manual therapy to mobilize LE, proximal hip and/or LS spine soft tissue/joints for the purpose of modulating pain, promoting relaxation,  increasing ROM, reducing/eliminating soft tissue swelling/inflammation/restriction, improving soft tissue extensibility and allowing for proper ROM for normal function with   [] LE / lumbar: self care, mobility, lifting and ambulation.    [] UE / Cervical: self care, reaching, carrying, lifting, house/yardwork, driving, computer work.     Modalities:  [] (99242) Vasopneumatic compression: Utilized vasopneumatic compression to decrease edema / swelling for the purpose of improving mobility and quad tone / recruitment which will allow for increased overall function including but not limited to self-care, transfers, ambulation, and ascending / descending stairs.       Modalities:  05/04/19:  Pt was set up on lumbar traction in supine with bolsters under B knees with parameters of 60/45 lbs  with on/off time of 30/10, for a total time of 10 minutes. Pt was given panic button as well as instructed how to use it if experiencing pain as well as given bell to call for needs.     05/09/19: Pt was set up on lumbar traction in supine with bolsters under B knees with parameters of 60/45 lbs with on/off time of 30/10, for a total time of 12 minutes. Pt was given panic button as well as instructed how to use it if experiencing pain as well as given bell to call for needs.       Charges:  Timed Code Treatment Minutes: 35   Total Treatment Minutes: 47     [] EVAL - LOW (68341)   [] EVAL - MOD (96222)  [] EVAL - HIGH (97989)  [] RE-EVAL (21194)  [x] RD(40814) x  2     [] Ionto  [] NMR (48185) x       [] Vaso  [] Manual (63149) x       [] Ultrasound  [] TA x        [x] Mech Traction (70263)  [] Aquatic Therapy x      [] ES (un) (78588):   [] Home Management Training x  [] ES(attended) (50277)   []  Dry Needling 1-2 muscles (20560):  [] Dry Needling 3+ muscles (102585)  [] Group:      [] Other:     GOALS:  Patient stated goal: "to be completely out of pain"   []? Progressing: []? Met: []? Not Met: []? Adjusted  ??  Therapist goals for Patient:   Short Term Goals: To be achieved in: 2 weeks  1. Independent in HEP and progression per patient tolerance, in order to prevent re-injury.   []? Progressing: []? Met: []? Not Met: []? Adjusted  2. Patient will have a decrease in pain to facilitate improvement in movement, function, and ADLs as indicated by Functional Deficits.  []? Progressing: []? Met: []? Not Met: []? Adjusted  ??  Long Term Goals: To be achieved in: 5 weeks  1. Disability index score of 40% or less for the NDI and Oswestry to assist with reaching prior level of function.   []? Progressing: []? Met: []? Not Met: []? Adjusted  2. Patient will demonstrate increased AROM to [facility] of cervical spine to allow for proper joint functioning as indicated by patients Functional Deficits.   []? Progressing: []? Met: []? Not  Met: []? Adjusted  3. Patient will demonstrate an increase in LE strength to 4+/5 for greater walking tolerance to improve community and social participation.   []? Progressing: []? Met: []? Not Met: []? Adjusted  4. Patient will return to functional activities including ability to stand to cook a meal without increased symptoms or restriction.   []? Progressing: []? Met: []? Not Met: []? Adjusted  5. Patient will report ability to complete ADLs independently 75% of the time.     []? Progressing: []? Met: []? Not Met: []? Adjusted         Overall Progression Towards Functional goals/ Treatment Progress Update:  [] Patient is progressing as expected towards functional goals listed.    [] Progression is slowed due to complexities/Impairments listed.  [] Progression has been slowed due to co-morbidities.  [x] Plan just implemented, too soon to assess goals progression <30days   [] Goals require adjustment due to lack of progress  [] Patient is not progressing as expected and requires additional follow up with physician  [] Other    Persisting Functional Limitations/Impairments:  [x]Sleeping [x]Sitting               [x]Standing []Transfers        [x]Walking []Kneeling               []Stairs []Squatting / bending   []ADLs [x]Reaching  [x]Lifting  []Housework  []Driving []Job related tasks  []Sports/Recreation []Other:        ASSESSMENT:  Pt tolerated treatment session well today with no increase in symptoms noted post exercises. Pt reported a neutral response immediately following mechanical traction last session with a the same response this session. Howver, Pt reported that her pain decreased in the time following. Continue to implement mechanical traction in future sessions and progress Ther Ex as tolerable. Pt was educated to monitor her symptoms throughout the day and until next session.       Treatment/Activity Tolerance:  [] Patient able to complete tx [] Patient limited by fatigue  [x] Patient limited by pain  []  Patient limited by other medical complications  [] Other:     Prognosis: [] Good [x] Fair  [] Poor    Patient Requires Follow-up: [x] Yes  [] No    Plan for next  treatment session:begin with active movement/gentle strengthening as tolerated.     PLAN: See eval. PT 2x / week for 5 weeks.   [x] Continue per plan of care [] Alter current plan (see comments)  [] Plan of care initiated [] Hold pending MD visit [] Discharge    Electronically signed by: Corlis Leak PT, DPT    Huston Foley, SPT  Therapist was present, directed the patient's care, made skilled judgement, and was responsible for assessment and treatment of the patient.        Note: If patient does not return for scheduled/ recommended follow up visits, this note will serve as a discharge from care along with most recent update on progress.

## 2019-05-11 ENCOUNTER — Inpatient Hospital Stay: Admit: 2019-05-11 | Payer: MEDICAID

## 2019-05-11 NOTE — Other (Signed)
[facility]  Phone: [phone]   Fax: [phone]    Physical Therapy Daily Treatment Note  Date:  05/11/2019    Patient Name:  Faith Mason    DOB:  July 12, 1975  MRN: [id number]  Medical/Treatment Diagnosis Information:  ?? Diagnosis: Neck pain; Bilateral shoulder pain, unspecified chronicity; Pain in both lower extremities  Treatment Diagnosis: Increased cervical and LE pain, decreased cervical and UE ROM, general UE and LE weakness, decreased tolerance for ADLs  Insurance/Certification information:  PT Insurance Information: [employer] - no precert, no deductible  Physician Information:  Referring Practitioner: [name], MD   Plan of care signed (Y/N): '[x]'   Yes '[]'   No     Date of Patient follow up with Physician:      Progress Report: '[]'   Yes  '[x]'   No     Date Range for reporting period:  Beginning: 04/18/19  Ending:     Progress report due (10 Rx/or 30 days whichever is less): visit #10 or 12/87/86 (date)     Recertification due (POC duration/ or 90 days whichever is less): visit #10 or 07/19/19 (date)     Visit # Insurance Allowable Auth required? Date Range   7/10 30 per year  13 used pre COVID '[]'   Yes  '[x]'   No n/a     Latex Allergy:  '[x]' NO      '[]' YES  Preferred Language for Healthcare:   '[]' English       '[x]' other: Nepali - pt's daughter stated she would be with her at each session and would like to interpret     Functional Scale:        Date assessed:  NDI: raw score = 36; dysfunction = 72%   04/18/19  Oswestry: raw score = 28; dysfunction = 62%  04/18/19    Pain level:  5/10 R Shoulder; 6/10 legs (Shoulder pain > hips)    SUBJECTIVE: Pt denied interpreter phone this date, daughter present.  Pt states she would like to try a cold pack today. She has not yet noticed much of a difference with traction. About 3 days ago, she started getting a burning sensation in her low back/upper gluteal area.     OBJECTIVE:      RESTRICTIONS/PRECAUTIONS: Pt very  guarded, slow moving     Exercises/Interventions:     Therapeutic Exercises (97110) Resistance / level Sets/sec Reps Notes   Nustep with UEs     UBE  3 min fwd/2 min retro     IB  HR  2  2 30''  10    pulleys   X 20    HSS at stairs   2 30'' B     upper trap stretch      Supine Chin tucks      Standing 3 way hip       Ball on wall chin tucks     TB mid row / shd ext    Squat at ballet bar     Standing knee flexion      Lat stretch at ballet bar   Thread the needle at ballet bar  20 sec  '1 3  10 ' B Attempted extension stretch but this increased pain    Cervical AROM flex/ext, rotation, SB      Wall slides       No money ER orange     Wall push ups       Bridge  SLR flex , ABD, ext   1 X 10 B each   LAQ     TG squats     Mini squats   1 15 Verbal and visual cues for form                         Therapeutic Activities (97530)       Step ups fwd, lateral  6 in  Cues for sequencing                                                    Neuromuscular Re-ed (92010)       CC:  Paloff press    2pl   4   5 presses                                       Manual Intervention (97140)       SOR, cervical pain free PROM, P/A mobs, upper trap stretch        STM B upper trap trigger point release, strain counter strain                                       Pt. Education:  -patient educated on diagnosis, prognosis and expectations for rehab  -all patient questions were answered    Home Exercise Program:  10/28: Pt instructed to continue performing HEP from last episode for now     Therapeutic Exercise and NMR EXR  '[x]'  (97110) Provided verbal/tactile cueing for activities related to strengthening, flexibility, endurance, ROM for improvements in  '[x]'  LE / Lumbar: LE, proximal hip, and core control with self care, mobility, lifting, ambulation.  '[]'  UE / Cervical: cervical, postural, scapular, scapulothoracic and UE control with self care, reaching, carrying, lifting, house/yardwork, driving, computer work.  '[]'  [phone]) Provided  verbal/tactile cueing for activities related to improving balance, coordination, kinesthetic sense, posture, motor skill, proprioception to assist with   '[]'  LE / lumbar: LE, proximal hip, and core control in self care, mobility, lifting, ambulation and eccentric single leg control.   '[]'  UE / cervical: cervical, scapular, scapulothoracic and UE control with self care, reaching, carrying, lifting, house/yardwork, driving, computer work.   '[]'  [phone]) Therapist is in constant attendance of 2 or more patients providing skilled therapy interventions, but not providing any significant amount of measurable one-on-one time to either patient, for improvements in  '[]'  LE / lumbar: LE, proximal hip, and core control in self care, mobility, lifting, ambulation and eccentric single leg control.   '[]'  UE / cervical: cervical, scapular, scapulothoracic and UE control with self care, reaching, carrying, lifting, house/yardwork, driving, computer work.     NMR and Therapeutic Activities:    '[]'  [phone] or 97530) Provided verbal/tactile cueing for activities related to improving balance, coordination, kinesthetic sense, posture, motor skill, proprioception and motor activation to allow for proper function of   '[]'  LE: / Lumbar core, proximal hip and LE with self care and ADLs  '[]'  UE / Cervical: cervical, postural, scapular, scapulothoracic and UE control with self care, carrying, lifting, driving, computer work.   '[]'  [phone]) Gait Re-education- Provided training  and instruction to the patient for proper LE, core and proximal hip recruitment and positioning and eccentric body weight control with ambulation re-education including up and down stairs     Home Management Training / Self Care:  '[]'  [phone]) Provided self-care/home management training related to activities of daily living and compensatory training, and/or use of adaptive equipment for improvement with: ADLs and compensatory training, meal preparation, safety procedures and  instruction in use of adaptive equipment, including bathing, grooming, dressing, personal hygiene, basic household cleaning and chores.     Home Exercise Program:    '[]'  [phone]) Reviewed/Progressed HEP activities related to strengthening, flexibility, endurance, ROM of   '[]'  LE / Lumbar: core, proximal hip and LE for functional self-care, mobility, lifting and ambulation/stair navigation   '[]'  UE / Cervical: cervical, postural, scapular, scapulothoracic and UE control with self care, reaching, carrying, lifting, house/yardwork, driving, computer work  '[]'  (97112)Reviewed/Progressed HEP activities related to improving balance, coordination, kinesthetic sense, posture, motor skill, proprioception of   '[]'  LE: core, proximal hip and LE for self care, mobility, lifting, and ambulation/stair navigation    '[]'  UE / Cervical: cervical, postural,  scapular, scapulothoracic and UE control with self care, reaching, carrying, lifting, house/yardwork, driving, computer work    Manual Treatments:  PROM / STM / Oscillations-Mobs:  G-I, II, III, IV (PA's, Inf., Post.)  '[]'  (97140) Provided manual therapy to mobilize LE, proximal hip and/or LS spine soft tissue/joints for the purpose of modulating pain, promoting relaxation,  increasing ROM, reducing/eliminating soft tissue swelling/inflammation/restriction, improving soft tissue extensibility and allowing for proper ROM for normal function with   '[]'  LE / lumbar: self care, mobility, lifting and ambulation.    '[]'  UE / Cervical: self care, reaching, carrying, lifting, house/yardwork, driving, computer work.     Modalities:  '[]'  (35686) Vasopneumatic compression: Utilized vasopneumatic compression to decrease edema / swelling for the purpose of improving mobility and quad tone / recruitment which will allow for increased overall function including but not limited to self-care, transfers, ambulation, and ascending / descending stairs.       Modalities:  05/04/19:  Pt was set up on lumbar  traction in supine with bolsters under B knees with parameters of 60/45 lbs with on/off time of 30/10, for a total time of 10 minutes. Pt was given panic button as well as instructed how to use it if experiencing pain as well as given bell to call for needs.     05/09/19: Pt was set up on lumbar traction in supine with bolsters under B knees with parameters of 60/45 lbs with on/off time of 30/10, for a total time of 12 minutes. Pt was given panic button as well as instructed how to use it if experiencing pain as well as given bell to call for needs.     05/11/19 Pt was set up on lumbar traction in supine with bolsters under B knees with parameters of 70/50 lbs with on/off time of 30/10, for a total time of 12 minutes. Pt was given panic button as well as instructed how to use it if experiencing pain as well as given bell to call for needs. CP to low back following traction x 10 min    Charges:  Timed Code Treatment Minutes: 33   Total Treatment Minutes: 55     '[]'  EVAL - LOW (97161)   '[]'  EVAL - MOD (16837)  '[]'  EVAL - HIGH (97163)  '[]'  RE-EVAL (29021)  '[x]'  JD(55208) x  2     '[]'   Ionto  '[]'  NMR (11914) x       '[]'  Vaso  '[]'  Manual (97140) x       '[]'  Ultrasound  '[]'  TA x        '[x]'  Mech Traction (78295)  '[]'  Aquatic Therapy x      '[]'  ES (un) (62130):   '[]'  Home Management Training x  '[]'  ES(attended) (86578)   '[]'  Dry Needling 1-2 muscles (46962):  '[]'  Dry Needling 3+ muscles (952841)  '[]'  Group:      '[]'  Other:     GOALS:  Patient stated goal: "to be completely out of pain"   '[]' ? Progressing: '[]' ? Met: '[]' ? Not Met: '[]' ? Adjusted  ??  Therapist goals for Patient:   Short Term Goals: To be achieved in: 2 weeks  1. Independent in HEP and progression per patient tolerance, in order to prevent re-injury.   '[]' ? Progressing: '[]' ? Met: '[]' ? Not Met: '[]' ? Adjusted  2. Patient will have a decrease in pain to facilitate improvement in movement, function, and ADLs as indicated by Functional Deficits.  '[]' ? Progressing: '[]' ? Met: '[]' ? Not Met: '[]' ?  Adjusted  ??  Long Term Goals: To be achieved in: 5 weeks  1. Disability index score of 40% or less for the NDI and Oswestry to assist with reaching prior level of function.   '[]' ? Progressing: '[]' ? Met: '[]' ? Not Met: '[]' ? Adjusted  2. Patient will demonstrate increased AROM to [facility] of cervical spine to allow for proper joint functioning as indicated by patients Functional Deficits.   '[]' ? Progressing: '[]' ? Met: '[]' ? Not Met: '[]' ? Adjusted  3. Patient will demonstrate an increase in LE strength to 4+/5 for greater walking tolerance to improve community and social participation.   '[]' ? Progressing: '[]' ? Met: '[]' ? Not Met: '[]' ? Adjusted  4. Patient will return to functional activities including ability to stand to cook a meal without increased symptoms or restriction.   '[]' ? Progressing: '[]' ? Met: '[]' ? Not Met: '[]' ? Adjusted  5. Patient will report ability to complete ADLs independently 75% of the time.     '[]' ? Progressing: '[]' ? Met: '[]' ? Not Met: '[]' ? Adjusted         Overall Progression Towards Functional goals/ Treatment Progress Update:  '[]'  Patient is progressing as expected towards functional goals listed.    '[]'  Progression is slowed due to complexities/Impairments listed.  '[]'  Progression has been slowed due to co-morbidities.  '[x]'  Plan just implemented, too soon to assess goals progression <30days   '[]'  Goals require adjustment due to lack of progress  '[]'  Patient is not progressing as expected and requires additional follow up with physician  '[]'  Other    Persisting Functional Limitations/Impairments:  '[x]' Sleeping '[x]' Sitting               '[x]' Standing '[]' Transfers        '[x]' Walking '[]' Kneeling               '[]' Stairs '[]' Squatting / bending   '[]' ADLs '[x]' Reaching  '[x]' Lifting  '[]' Housework  '[]' Driving '[]' Job related tasks  '[]' Sports/Recreation '[]' Other:        ASSESSMENT:  Pt has 2 remaining visits and will need a reassessment. Pt does not report significant improvement thus far, nor has she demonstrated the ability to significantly progress  throughout the sessions so pt may benefit from follow up with referring MD for further testing.     Treatment/Activity Tolerance:  '[]'  Patient able to complete tx '[]'  Patient limited by fatigue  '[x]'  Patient limited by pain  '[]'   Patient limited by other medical complications  '[]'  Other:     Prognosis: '[]'  Good '[x]'  Fair  '[]'  Poor    Patient Requires Follow-up: '[x]'  Yes  '[]'  No    Plan for next treatment session:begin with active movement/gentle strengthening as tolerated.     PLAN: See eval. PT 2x / week for 5 weeks.   '[x]'  Continue per plan of care '[]'  Alter current plan (see comments)  '[]'  Plan of care initiated '[]'  Hold pending MD visit '[]'  Discharge    Electronically signed by: Corlis Leak PT, DPT      Note: If patient does not return for scheduled/ recommended follow up visits, this note will serve as a discharge from care along with most recent update on progress.

## 2019-05-14 ENCOUNTER — Inpatient Hospital Stay: Admit: 2019-05-14 | Payer: MEDICAID

## 2019-05-14 NOTE — Other (Signed)
[facility]  Phone: [phone]   Fax: [phone]    Physical Therapy Daily Treatment Note  Date:  05/14/2019    Patient Name:  Faith Mason    DOB:  1975-09-14  MRN: [id number]  Medical/Treatment Diagnosis Information:  ?? Diagnosis: Neck pain; Bilateral shoulder pain, unspecified chronicity; Pain in both lower extremities  Treatment Diagnosis: Increased cervical and LE pain, decreased cervical and UE ROM, general UE and LE weakness, decreased tolerance for ADLs  Insurance/Certification information:  PT Insurance Information: [employer] - no precert, no deductible  Physician Information:  Referring Practitioner: [name], MD   Plan of care signed (Y/N): _0   Yes _1   No     Date of Patient follow up with Physician:      Progress Report: _2   Yes  _3   No     Date Range for reporting period:  Beginning: 04/18/19  Ending:     Progress report due (10 Rx/or 30 days whichever is less): visit #10 or 33/74/45 (date)     Recertification due (POC duration/ or 90 days whichever is less): visit #10 or 07/19/19 (date)     Visit # Insurance Allowable Auth required? Date Range   8/10 30 per year  13 used pre COVID _4   Yes  _5   No n/a     Latex Allergy:  _6 NO      _7 YES  Preferred Language for Healthcare:   _8 English       _9 other: Nepali - pt's daughter stated she would be with her at each session and would like to interpret     Functional Scale:        Date assessed:  NDI: raw score = 36; dysfunction = 72%   04/18/19  Oswestry: raw score = 28; dysfunction = 62%  04/18/19    Pain level:  5/10 R Shoulder; 6/10 legs (Shoulder pain > hips)    SUBJECTIVE: Pt denied interpreter phone this date, daughter present.  Pt presents to therapy this date reporting that she is feeling a lot better with decreased pain lvls and burning sensations overall.  The more she moves her R UE the more burning she feels in the R shoulder blade that she describes as burning. Pt states that she  still is having burning pain that travels down to B LEs but that it is getting better than it was before.      OBJECTIVE:      RESTRICTIONS/PRECAUTIONS: Pt very guarded, slow moving     Exercises/Interventions:     Therapeutic Exercises (97110) Resistance / level Sets/sec Reps Notes   Nustep with UEs     UBE  3 min fwd/2 min retro     IB  HR  2  2 30''  10    pulleys   X 20    HSS at stairs   2 30'' B     upper trap stretch      Supine Chin tucks      Standing 3 way hip       Ball on wall chin tucks     TB mid row / shd ext    Squat at ballet bar     Standing knee flexion      Lat stretch at ballet bar   Thread the needle at ballet bar  20 sec  _10 B Attempted extension stretch but this increased pain    Cervical AROM flex/ext,  rotation, SB      Wall slides       No money ER orange     Wall push ups       Bridge        SLR flex , ABD, ext   1 X 10 B each   LAQ     TG squats     Mini squats   2 10 Verbal and visual cues for form                         Therapeutic Activities (97530)       Step ups fwd, lateral  6 in  Cues for sequencing                                                    Neuromuscular Re-ed (38887)       CC:  Paloff press    2pl   4   5 presses                                       Manual Intervention (97140)       SOR, cervical pain free PROM, P/A mobs, upper trap stretch        STM B upper trap trigger point release, strain counter strain                                       Pt. Education:  -patient educated on diagnosis, prognosis and expectations for rehab  -all patient questions were answered    Home Exercise Program:  10/28: Pt instructed to continue performing HEP from last episode for now     Therapeutic Exercise and NMR EXR  _0  (97110) Provided verbal/tactile cueing for activities related to strengthening, flexibility, endurance, ROM for improvements in  _1  LE / Lumbar: LE, proximal hip, and core control with self care, mobility, lifting, ambulation.  _2  UE / Cervical: cervical,  postural, scapular, scapulothoracic and UE control with self care, reaching, carrying, lifting, house/yardwork, driving, computer work.  _3  [phone]) Provided verbal/tactile cueing for activities related to improving balance, coordination, kinesthetic sense, posture, motor skill, proprioception to assist with   _4  LE / lumbar: LE, proximal hip, and core control in self care, mobility, lifting, ambulation and eccentric single leg control.   _5  UE / cervical: cervical, scapular, scapulothoracic and UE control with self care, reaching, carrying, lifting, house/yardwork, driving, computer work.   _6  [phone]) Therapist is in constant attendance of 2 or more patients providing skilled therapy interventions, but not providing any significant amount of measurable one-on-one time to either patient, for improvements in  _7  LE / lumbar: LE, proximal hip, and core control in self care, mobility, lifting, ambulation and eccentric single leg control.   _8  UE / cervical: cervical, scapular, scapulothoracic and UE control with self care, reaching, carrying, lifting, house/yardwork, driving, computer work.     NMR and Therapeutic Activities:    _9  (15615 or 37943) Provided verbal/tactile cueing for activities related to improving balance, coordination, kinesthetic sense, posture, motor skill, proprioception and motor activation to allow for proper function of   _10  LE: /  Lumbar core, proximal hip and LE with self care and ADLs  _0  UE / Cervical: cervical, postural, scapular, scapulothoracic and UE control with self care, carrying, lifting, driving, computer work.   _1  (69507) Gait Re-education- Provided training and instruction to the patient for proper LE, core and proximal hip recruitment and positioning and eccentric body weight control with ambulation re-education including up and down stairs     Home Management Training / Self Care:  _2  (22575) Provided self-care/home management training related to activities of daily living and  compensatory training, and/or use of adaptive equipment for improvement with: ADLs and compensatory training, meal preparation, safety procedures and instruction in use of adaptive equipment, including bathing, grooming, dressing, personal hygiene, basic household cleaning and chores.     Home Exercise Program:    _3  (05183) Reviewed/Progressed HEP activities related to strengthening, flexibility, endurance, ROM of   _4  LE / Lumbar: core, proximal hip and LE for functional self-care, mobility, lifting and ambulation/stair navigation   _5  UE / Cervical: cervical, postural, scapular, scapulothoracic and UE control with self care, reaching, carrying, lifting, house/yardwork, driving, computer work  _6  (97112)Reviewed/Progressed HEP activities related to improving balance, coordination, kinesthetic sense, posture, motor skill, proprioception of   _7  LE: core, proximal hip and LE for self care, mobility, lifting, and ambulation/stair navigation    _8  UE / Cervical: cervical, postural,  scapular, scapulothoracic and UE control with self care, reaching, carrying, lifting, house/yardwork, driving, computer work    Manual Treatments:  PROM / STM / Oscillations-Mobs:  G-I, II, III, IV (PA's, Inf., Post.)  _9  (97140) Provided manual therapy to mobilize LE, proximal hip and/or LS spine soft tissue/joints for the purpose of modulating pain, promoting relaxation,  increasing ROM, reducing/eliminating soft tissue swelling/inflammation/restriction, improving soft tissue extensibility and allowing for proper ROM for normal function with   _10  LE / lumbar: self care, mobility, lifting and ambulation.    _11  UE / Cervical: self care, reaching, carrying, lifting, house/yardwork, driving, computer work.     Modalities:  _12  (35825) Vasopneumatic compression: Utilized vasopneumatic compression to decrease edema / swelling for the purpose of improving mobility and quad tone / recruitment which will allow for increased overall function  including but not limited to self-care, transfers, ambulation, and ascending / descending stairs.       Modalities:  05/04/19:  Pt was set up on lumbar traction in supine with bolsters under B knees with parameters of 60/45 lbs with on/off time of 30/10, for a total time of 10 minutes. Pt was given panic button as well as instructed how to use it if experiencing pain as well as given bell to call for needs.     05/09/19: Pt was set up on lumbar traction in supine with bolsters under B knees with parameters of 60/45 lbs with on/off time of 30/10, for a total time of 12 minutes. Pt was given panic button as well as instructed how to use it if experiencing pain as well as given bell to call for needs.     05/11/19 Pt was set up on lumbar traction in supine with bolsters under B knees with parameters of 70/50 lbs with on/off time of 30/10, for a total time of 12 minutes. Pt was given panic button as well as instructed how to use it if experiencing pain as well as given bell to call for needs. CP to low back following traction x 10 min    05/14/19: Pt was set up on lumbar traction  in supine with bolsters under B knees with parameters of 70/50 lbs with on/off time of 30/10, for a total time of 12 minutes. Pt was given panic button as well as instructed how to use it if experiencing pain as well as given bell to call for needs. CP to low back following traction x 10 min      Charges:  Timed Code Treatment Minutes: 35   Total Treatment Minutes: 55     _0  EVAL - LOW (97161)   _1  EVAL - MOD (49179)  _2  EVAL - HIGH (97163)  _3  RE-EVAL (15056)  _4  PV(94801) x  2     _5  Ionto  _6  NMR (65537) x       _7  Vaso  _8  Manual (97140) x       _9  Ultrasound  _10  TA x        _11  Mech Traction (48270)  _12  Aquatic Therapy x      _13  ES (un) (78675):   _14  Home Management Training x  _15  ES(attended) (44920)   _16  Dry Needling 1-2 muscles (10071):  _17  Dry Needling 3+ muscles (219758)  _18  Group:      _19  Other:     GOALS:  Patient stated goal: "to  be completely out of pain"   _20 ? Progressing: _21 ? Met: _22 ? Not Met: _23 ? Adjusted  ??  Therapist goals for Patient:   Short Term Goals: To be achieved in: 2 weeks  1. Independent in HEP and progression per patient tolerance, in order to prevent re-injury.   _24 ? Progressing: _25 ? Met: _26 ? Not Met: _27 ? Adjusted  2. Patient will have a decrease in pain to facilitate improvement in movement, function, and ADLs as indicated by Functional Deficits.  _28 ? Progressing: _29 ? Met: _30 ? Not Met: _31 ? Adjusted  ??  Long Term Goals: To be achieved in: 5 weeks  1. Disability index score of 40% or less for the NDI and Oswestry to assist with reaching prior level of function.   _32 ? Progressing: _33 ? Met: _34 ? Not Met: _35 ? Adjusted  2. Patient will demonstrate increased AROM to [facility] of cervical spine to allow for proper joint functioning as indicated by patients Functional Deficits.   _36 ? Progressing: _37 ? Met: _38 ? Not Met: _39 ? Adjusted  3. Patient will demonstrate an increase in LE strength to 4+/5 for greater walking tolerance to improve community and social participation.   _40 ? Progressing: _41 ? Met: _42 ? Not Met: _43 ? Adjusted  4. Patient will return to functional activities including ability to stand to cook a meal without increased symptoms or restriction.   _44 ? Progressing: _45 ? Met: _46 ? Not Met: _47 ? Adjusted  5. Patient will report ability to complete ADLs independently 75% of the time.     _48 ? Progressing: _49 ? Met: _50 ? Not Met: _51 ? Adjusted         Overall Progression Towards Functional goals/ Treatment Progress Update:  _52  Patient is progressing as expected towards functional goals listed.    _53  Progression is slowed due to complexities/Impairments listed.  _54  Progression has been slowed due to co-morbidities.  _55  Plan just implemented, too soon to assess goals progression <30days   _56  Goals require adjustment due to lack of progress  _57  Patient is not progressing as expected and requires additional follow up with physician  _58   Other    Persisting Functional Limitations/Impairments:  _59 Sleeping _60 Sitting               _61 Standing _62 Transfers        [  x]Walking _0 Kneeling               _1 Stairs _2 Squatting / bending   _3 ADLs _4 Reaching  _5 Lifting  _6 Housework  _7 Driving <RDEYCXKGYJEHUDJS>_9<\/FWYOVZCHYIFOYDXA>_1 Job related tasks  _9 Sports/Recreation _10 Other:        ASSESSMENT: Pt reports positive response to therapy since last visit with overall pain lvls and severity of radiculopathy decreased. Pt still complains of burning type of sensation in R shoulder blade and down B LEs. Mechanical traction was resumed at the end of treatment under same parameters based on positive response to last treatment. Continue to progress ther ex as tolerable.     Treatment/Activity Tolerance:  _11  Patient able to complete tx _12  Patient limited by fatigue  _13  Patient limited by pain  _14  Patient limited by other medical complications  <OINOMVEHMCNOBSJG>_2<\/EZMOQHUTMLYYTKPT>_46  Other:     Prognosis: _16  Good _17  Fair  _18  Poor    Patient Requires Follow-up: _19  Yes  _20  No    Plan for next treatment session:begin with active movement/gentle strengthening as tolerated.     PLAN: See eval. PT 2x / week for 5 weeks.   _21  Continue per plan of care _22  Alter current plan (see comments)  _23  Plan of care initiated _24  Hold pending MD visit _25  Discharge    Electronically signed by: Huston Foley, SPT    Therapist was present, directed the patient's care, made skilled judgement, and was responsible for assessment and treatment of the patient.          Note: If patient does not return for scheduled/ recommended follow up visits, this note will serve as a discharge from care along with most recent update on progress.

## 2019-05-16 ENCOUNTER — Inpatient Hospital Stay: Admit: 2019-05-16 | Payer: MEDICAID

## 2019-05-16 NOTE — Progress Notes (Signed)
[facility]  Phone: [phone]   Fax: [phone]   Physical Therapy Re-Certification Plan of Care    Dear   [name], MD,     We had the pleasure of treating the following patient for physical therapy services at [facility]. A summary of our findings can be found in the updated assessment below.  This includes our plan of care.  If you have any questions or concerns regarding these findings, please do not hesitate to contact me at the office phone number checked above.  Thank you for the referral.     Physician Signature:________________________________Date:__________________  By signing above (or electronic signature), therapist???s plan is approved by physician      Functional Outcome: NDI: raw score = 20; dysfunction = 44%; Oswestry: raw score = 25; dysfunction = 55%    CERV ROM ?? ??   Cervical Flexion 44 ??   Cervical Extension 42 ??   Cervical SB 32 L 37 R   Cervical rotation 54 L 59 R    ?? ?? ??   ROM Left Right   Shoulder Flex 140 140 *   Shoulder Abd 140 140   Shoulder ER C-spine C-spine*   Shoulder IR Lumbar spine Lumbar spine   * = painful ?? ??   ?? ?? ??   Strength / Myotomes Left Right   Shoulder Shrug (C4) ?? ??   Shoulder Flex 4- 4-   Shoulder Scap ?? ??   Shoulder Abduction (C5) 4- 4-   Shoulder ER 4 4-   Shoulder IR 4 4-   Biceps (C6) 4 4   Triceps (C7) 4 4   LE MMT:                                 Left:                 Right:  Hip Flexion:                             4-                    3+          Hip aBduction:  Hip Extension:  Hip IR:                                     3+                    3+  Hip ER:                                    4-                    4-  Knee Extension:                       3+            4-  Knee Flexion:  3+                   4-    Overall Response to Treatment:   '[x]' Patient is responding well to treatment and improvement is noted with  regards  to goals   '[]' Patient should continue to improve in reasonable time if they continue HEP   '[]' Patient has plateaued and is no longer responding to skilled PT intervention    '[]' Patient is getting worse and would benefit from return to referring MD   '[]' Patient unable to adhere to initial POC   '[]' Other:     Date range of Visits: 04/18/19 - 05/16/19  Total Visits: 9    Recommendation:    '[x]' Continue PT 2x / wk for 3 weeks.    '[]' Hold PT, pending MD visit      Physical Therapy Daily Treatment Note  Date:  05/16/2019    Patient Name:  Faith Mason    DOB:  May 04, 1976  MRN: [id number]  Medical/Treatment Diagnosis Information:  ?? Diagnosis: Neck pain; Bilateral shoulder pain, unspecified chronicity; Pain in both lower extremities  Treatment Diagnosis: Increased cervical and LE pain, decreased cervical and UE ROM, general UE and LE weakness, decreased tolerance for ADLs  Insurance/Certification information:  PT Insurance Information: [employer] - no precert, no deductible  Physician Information:  Referring Practitioner: [name], MD   Plan of care signed (Y/N): '[x]'   Yes '[]'   No     Date of Patient follow up with Physician:      Progress Report: '[x]'   Yes  '[]'   No     Date Range for reporting period:  Beginning: 04/18/19  Ending: 05/16/19    Progress report due (10 Rx/or 30 days whichever is less): visit #10 or 21/11/55 (date)     Recertification due (POC duration/ or 90 days whichever is less): visit #10 or 07/19/19 (date)     Visit # Insurance Allowable Auth required? Date Range   9/10  + 0/6 30 per year  13 used pre COVID '[]'   Yes  '[x]'   No n/a     Latex Allergy:  '[x]' NO      '[]' YES  Preferred Language for Healthcare:   '[]' English       '[x]' other: Nepali - pt's daughter stated she would be with her at each session and would like to interpret     Functional Scale:        Date assessed:  NDI: raw score = 36; dysfunction = 72%   04/18/19  Oswestry: raw score = 28; dysfunction = 62%  04/18/19    Pain level:  4-5/10 R Shoulder;  6/10 legs     SUBJECTIVE: Pt denied interpreter phone this date, daughter present.  Pt reports she feels much better, but still has some mild N/T on the L side. She reports 80-90% improvement. She would like to continue to work more on her legs. She still also has a slight burning in her R rhomboid. Pt would like to skip traction today because she isn't sure which activity is causing more pain. Pt is performing ADLs with less assistance now. She can stand and cook a meal for 30 minutes, but then has to sit and take a break.       OBJECTIVE:  11/25: see measurements above     RESTRICTIONS/PRECAUTIONS: Pt very guarded, slow moving     Exercises/Interventions:     Therapeutic Exercises (97110) Resistance / level Sets/sec Reps Notes   Nustep with UEs  UBE  3 min fwd/2 min retro     IB  HR  2  2 30''  10    pulleys   X 20    HSS at stairs   2 30'' B     upper trap stretch      Supine Chin tucks      Standing 3 way hip       Ball on wall chin tucks     TB mid row / shd ext    Squat at ballet bar     Standing knee flexion      Lat stretch at ballet bar   Thread the needle at ballet bar  Attempted extension stretch but this increased pain    Cervical AROM flex/ext, rotation, SB      Wall slides       No money ER orange     Wall push ups       Bridge        SLR flex , ABD, ext   1 X 10 B each   LAQ     TG squats     Mini squats   Verbal and visual cues for form                         Therapeutic Activities (97530)       Step ups fwd, lateral  6 in  Cues for sequencing                                                    Neuromuscular Re-ed (93790)       CC:  Paloff press                                        Manual Intervention (97140)       SOR, cervical pain free PROM, P/A mobs, upper trap stretch        STM B upper trap trigger point release, strain counter strain       Manual cervical traction  6 min                               Pt. Education:  -patient educated on diagnosis, prognosis and expectations for  rehab  -all patient questions were answered  11/25: Discussed progress made since beginning therapy. Also discussed possible causes of numbness in R shoulder blade and hands.   Home Exercise Program:  10/28: Pt instructed to continue performing HEP from last episode for now     Therapeutic Exercise and NMR EXR  '[x]'  (97110) Provided verbal/tactile cueing for activities related to strengthening, flexibility, endurance, ROM for improvements in  '[x]'  LE / Lumbar: LE, proximal hip, and core control with self care, mobility, lifting, ambulation.  '[]'  UE / Cervical: cervical, postural, scapular, scapulothoracic and UE control with self care, reaching, carrying, lifting, house/yardwork, driving, computer work.  '[]'  [phone]) Provided verbal/tactile cueing for activities related to improving balance, coordination, kinesthetic sense, posture, motor skill, proprioception to assist with   '[]'  LE / lumbar: LE, proximal hip, and core control in self care, mobility, lifting, ambulation and eccentric single leg control.   '[]'   UE / cervical: cervical, scapular, scapulothoracic and UE control with self care, reaching, carrying, lifting, house/yardwork, driving, computer work.   '[]'  [phone]) Therapist is in constant attendance of 2 or more patients providing skilled therapy interventions, but not providing any significant amount of measurable one-on-one time to either patient, for improvements in  '[]'  LE / lumbar: LE, proximal hip, and core control in self care, mobility, lifting, ambulation and eccentric single leg control.   '[]'  UE / cervical: cervical, scapular, scapulothoracic and UE control with self care, reaching, carrying, lifting, house/yardwork, driving, computer work.     NMR and Therapeutic Activities:    '[]'  [phone] or 48546) Provided verbal/tactile cueing for activities related to improving balance, coordination, kinesthetic sense, posture, motor skill, proprioception and motor activation to allow for proper function of   '[]'  LE: /  Lumbar core, proximal hip and LE with self care and ADLs  '[]'  UE / Cervical: cervical, postural, scapular, scapulothoracic and UE control with self care, carrying, lifting, driving, computer work.   '[]'  [phone]) Gait Re-education- Provided training and instruction to the patient for proper LE, core and proximal hip recruitment and positioning and eccentric body weight control with ambulation re-education including up and down stairs     Home Management Training / Self Care:  '[]'  (00938) Provided self-care/home management training related to activities of daily living and compensatory training, and/or use of adaptive equipment for improvement with: ADLs and compensatory training, meal preparation, safety procedures and instruction in use of adaptive equipment, including bathing, grooming, dressing, personal hygiene, basic household cleaning and chores.     Home Exercise Program:    '[]'  [phone]) Reviewed/Progressed HEP activities related to strengthening, flexibility, endurance, ROM of   '[]'  LE / Lumbar: core, proximal hip and LE for functional self-care, mobility, lifting and ambulation/stair navigation   '[]'  UE / Cervical: cervical, postural, scapular, scapulothoracic and UE control with self care, reaching, carrying, lifting, house/yardwork, driving, computer work  '[]'  (97112)Reviewed/Progressed HEP activities related to improving balance, coordination, kinesthetic sense, posture, motor skill, proprioception of   '[]'  LE: core, proximal hip and LE for self care, mobility, lifting, and ambulation/stair navigation    '[]'  UE / Cervical: cervical, postural,  scapular, scapulothoracic and UE control with self care, reaching, carrying, lifting, house/yardwork, driving, computer work    Manual Treatments:  PROM / STM / Oscillations-Mobs:  G-I, II, III, IV (PA's, Inf., Post.)  '[x]'  (97140) Provided manual therapy to mobilize LE, proximal hip and/or LS spine soft tissue/joints for the purpose of modulating pain, promoting relaxation,   increasing ROM, reducing/eliminating soft tissue swelling/inflammation/restriction, improving soft tissue extensibility and allowing for proper ROM for normal function with   '[]'  LE / lumbar: self care, mobility, lifting and ambulation.    '[]'  UE / Cervical: self care, reaching, carrying, lifting, house/yardwork, driving, computer work.     Modalities:  '[]'  (37169) Vasopneumatic compression: Utilized vasopneumatic compression to decrease edema / swelling for the purpose of improving mobility and quad tone / recruitment which will allow for increased overall function including but not limited to self-care, transfers, ambulation, and ascending / descending stairs.       Modalities:  05/04/19:  Pt was set up on lumbar traction in supine with bolsters under B knees with parameters of 60/45 lbs with on/off time of 30/10, for a total time of 10 minutes. Pt was given panic button as well as instructed how to use it if experiencing pain as well as given bell to call for needs.  05/09/19: Pt was set up on lumbar traction in supine with bolsters under B knees with parameters of 60/45 lbs with on/off time of 30/10, for a total time of 12 minutes. Pt was given panic button as well as instructed how to use it if experiencing pain as well as given bell to call for needs.     05/11/19 Pt was set up on lumbar traction in supine with bolsters under B knees with parameters of 70/50 lbs with on/off time of 30/10, for a total time of 12 minutes. Pt was given panic button as well as instructed how to use it if experiencing pain as well as given bell to call for needs. CP to low back following traction x 10 min    05/14/19: Pt was set up on lumbar traction in supine with bolsters under B knees with parameters of 70/50 lbs with on/off time of 30/10, for a total time of 12 minutes. Pt was given panic button as well as instructed how to use it if experiencing pain as well as given bell to call for needs. CP to low back following traction x  10 min      Charges:  Timed Code Treatment Minutes: 43   Total Treatment Minutes: 43     '[]'  EVAL - LOW (97161)   '[]'  EVAL - MOD (15400)  '[]'  EVAL - HIGH (86761)  '[]'  RE-EVAL (95093)  '[x]'  OI(71245) x  2     '[]'  Ionto  '[]'  NMR (80998) x       '[]'  Vaso  '[]'  Manual (97140) x       '[]'  Ultrasound  '[x]'  TA x  1      '[]'  Mech Traction (33825)  '[]'  Aquatic Therapy x      '[]'  ES (un) (97014):   '[]'  Home Management Training x  '[]'  ES(attended) (05397)   '[]'  Dry Needling 1-2 muscles (67341):  '[]'  Dry Needling 3+ muscles (937902)  '[]'  Group:      '[]'  Other:     GOALS:  Patient stated goal: "to be completely out of pain"   '[]' ? Progressing: '[]' ? Met: '[]' ? Not Met: '[]' ? Adjusted  ??  Therapist goals for Patient:   Short Term Goals: To be achieved in: 2 weeks  1. Independent in HEP and progression per patient tolerance, in order to prevent re-injury.   '[]' ? Progressing: '[x]' ? Met: '[]' ? Not Met: '[]' ? Adjusted  2. Patient will have a decrease in pain to facilitate improvement in movement, function, and ADLs as indicated by Functional Deficits.  '[]' ? Progressing: '[x]' ? Met: '[]' ? Not Met: '[]' ? Adjusted  ??  Long Term Goals: To be achieved in: 5 weeks  1. Disability index score of 40% or less for the NDI and Oswestry to assist with reaching prior level of function.   '[x]' ? Progressing: '[]' ? Met: '[]' ? Not Met: '[]' ? Adjusted  2. Patient will demonstrate increased AROM to [facility] of cervical spine to allow for proper joint functioning as indicated by patients Functional Deficits.   '[x]' ? Progressing: '[]' ? Met: '[]' ? Not Met: '[]' ? Adjusted  3. Patient will demonstrate an increase in LE strength to 4+/5 for greater walking tolerance to improve community and social participation.   '[x]' ? Progressing: '[]' ? Met: '[]' ? Not Met: '[]' ? Adjusted  4. Patient will return to functional activities including ability to stand to cook a meal without increased symptoms or restriction.   '[]' ? Progressing: '[]' ? Met: '[]' ? Not Met: '[]' ? Adjusted  5. Patient will report ability to complete ADLs  independently 75%  of the time.     '[]' ? Progressing: '[x]' ? Met: '[]' ? Not Met: '[]' ? Adjusted         Overall Progression Towards Functional goals/ Treatment Progress Update:  '[]'  Patient is progressing as expected towards functional goals listed.    '[]'  Progression is slowed due to complexities/Impairments listed.  '[]'  Progression has been slowed due to co-morbidities.  '[x]'  Plan just implemented, too soon to assess goals progression <30days   '[]'  Goals require adjustment due to lack of progress  '[]'  Patient is not progressing as expected and requires additional follow up with physician  '[]'  Other    Persisting Functional Limitations/Impairments:  '[x]' Sleeping '[x]' Sitting               '[x]' Standing '[]' Transfers        '[x]' Walking '[]' Kneeling               '[]' Stairs '[]' Squatting / bending   '[]' ADLs '[x]' Reaching  '[x]' Lifting  '[]' Housework  '[]' Driving '[]' Job related tasks  '[]' Sports/Recreation '[]' Other:        ASSESSMENT: Pt is not sure what is causing occasionnaly increased low back pain, and requested to skip mechanical traction this date to determine if that was the cause. Pt has decided to continue to hopefully completely eliminate N/T in LE.     Treatment/Activity Tolerance:  '[]'  Patient able to complete tx '[]'  Patient limited by fatigue  '[x]'  Patient limited by pain  '[]'  Patient limited by other medical complications  '[]'  Other:     Prognosis: '[]'  Good '[x]'  Fair  '[]'  Poor    Patient Requires Follow-up: '[x]'  Yes  '[]'  No    Plan for next treatment session:begin with active movement/gentle strengthening as tolerated.     PLAN: See eval. PT 2x / week for 5 weeks.   '[x]'  Continue per plan of care '[]'  Alter current plan (see comments)  '[]'  Plan of care initiated '[]'  Hold pending MD visit '[]'  Discharge    Electronically signed by: Corlis Leak, PT DPT    Note: If patient does not return for scheduled/ recommended follow up visits, this note will serve as a discharge from care along with most recent update on progress.

## 2019-05-18 ENCOUNTER — Inpatient Hospital Stay: Admit: 2019-05-18 | Payer: MEDICAID

## 2019-05-18 DIAGNOSIS — M5412 Radiculopathy, cervical region: Secondary | ICD-10-CM

## 2019-05-22 ENCOUNTER — Inpatient Hospital Stay: Admit: 2019-05-22 | Payer: MEDICAID

## 2019-05-22 DIAGNOSIS — M542 Cervicalgia: Secondary | ICD-10-CM

## 2019-05-22 NOTE — Other (Signed)
[facility] - Outpatient Physical Therapy  Phone: [phone]   Fax: [phone]    Physical Therapy Daily Treatment Note  Date:  05/22/2019    Patient Name:  Faith Mason    DOB:  04/09/1976  MRN: [id number]  Medical/Treatment Diagnosis Information:   Diagnosis: Neck pain; Bilateral shoulder pain, unspecified chronicity; Pain in both lower extremities  Treatment Diagnosis: Increased cervical and LE pain, decreased cervical and UE ROM, general UE and LE weakness, decreased tolerance for ADLs  Insurance/Certification information:  PT Insurance Information: [employer] - no precert, no deductible  Physician Information:  Referring Practitioner: Terence Lux, MD   Plan of care signed (Y/N): [x]   Yes []   No     Date of Patient follow up with Physician:      Progress Report: []   Yes  [x]   No     Date Range for reporting period:  Beginning: 05/16/19  Ending:     Progress report due (10 Rx/or 30 days whichever is less): visit #16 or 06/15/19     Recertification due (POC duration/ or 90 days whichever is less): visit #16 or 07/19/19      Visit # Insurance Allowable Auth required? Date Range   10/10  + 0/6 30 per year  13 used pre COVID []   Yes  [x]   No n/a     Latex Allergy:  [x] NO      [] YES  Preferred Language for Healthcare:   [] English       [x] other: Nepali - pt's daughter stated she would be with her at each session and would like to interpret     Functional Scale:        Date assessed:  NDI: raw score = 36; dysfunction = 72%   04/18/19  Oswestry: raw score = 28; dysfunction = 62%  04/18/19    Pain level:  4/10 R Shoulder; 5/10 legs & back    SUBJECTIVE: Pt denied interpreter phone this date, daughter present.      OBJECTIVE:  11/25: see measurements above     RESTRICTIONS/PRECAUTIONS: Pt very guarded, slow moving     Exercises/Interventions:     Therapeutic Exercises (97110) Resistance / level Sets/sec Reps Notes   Nustep with UEs     UBE  2 min fwd/2 min retro     IB  HR  1  2 45''  10  12/1: Done at steps   pulleys       HSS at stairs   1 30'' B     upper trap stretch      Supine Chin tucks      Standing 3 way hip       Ball on wall chin tucks     TB mid row   TB shd ext BLUE  BLUE 2  2 10  10     Squat at ballet bar     Standing knee flexion      Lat stretch at ballet bar   Thread the needle at ballet bar  Attempted extension stretch but this increased pain    Cervical AROM flex/ext, rotation, SB      Wall slides       No money ER orange     Wall push ups       Bridge        SLR flex , ABD, ext   1 X 10 B each   LAQ     TG squats  TG single squats  2  1 10  10  B    Mini squats   Verbal and visual cues for form                         Therapeutic Activities (97530)       Step ups fwd, lateral  6 in  1 10 B Cues for sequencing                                                    Neuromuscular Re-ed (16109)       CC:  Paloff press                                        Manual Intervention (97140)       SOR, cervical pain free PROM, P/A mobs, upper trap stretch        STM B upper trap trigger point release, strain counter strain       Manual cervical traction  7 min     STM to cervical paraspinals and UTs  3 min                        Pt. Education:  -patient educated on diagnosis, prognosis and expectations for rehab  -all patient questions were answered  11/25: Discussed progress made since beginning therapy. Also discussed possible causes of numbness in R shoulder blade and hands.   Home Exercise Program:  10/28: Pt instructed to continue performing HEP from last episode for now     Therapeutic Exercise and NMR EXR  [x]  (97110) Provided verbal/tactile cueing for activities related to strengthening, flexibility, endurance, ROM for improvements in  [x]  LE / Lumbar: LE, proximal hip, and core control with self care, mobility, lifting, ambulation.  [x]  UE / Cervical: cervical, postural, scapular, scapulothoracic and UE control with self care, reaching, carrying, lifting, house/yardwork, driving, computer  work.  []  [phone]) Provided verbal/tactile cueing for activities related to improving balance, coordination, kinesthetic sense, posture, motor skill, proprioception to assist with   []  LE / lumbar: LE, proximal hip, and core control in self care, mobility, lifting, ambulation and eccentric single leg control.   []  UE / cervical: cervical, scapular, scapulothoracic and UE control with self care, reaching, carrying, lifting, house/yardwork, driving, computer work.   []  [phone]) Therapist is in constant attendance of 2 or more patients providing skilled therapy interventions, but not providing any significant amount of measurable one-on-one time to either patient, for improvements in  []  LE / lumbar: LE, proximal hip, and core control in self care, mobility, lifting, ambulation and eccentric single leg control.   []  UE / cervical: cervical, scapular, scapulothoracic and UE control with self care, reaching, carrying, lifting, house/yardwork, driving, computer work.     NMR and Therapeutic Activities:    []  [phone] or [phone]) Provided verbal/tactile cueing for activities related to improving balance, coordination, kinesthetic sense, posture, motor skill, proprioception and motor activation to allow for proper function of   []  LE: / Lumbar core, proximal hip and LE with self care and ADLs  []  UE / Cervical: cervical, postural, scapular, scapulothoracic and UE control with self care, carrying, lifting, driving, computer  work.   []  [phone]) Gait Re-education- Provided training and instruction to the patient for proper LE, core and proximal hip recruitment and positioning and eccentric body weight control with ambulation re-education including up and down stairs     Home Management Training / Self Care:  []  (01601) Provided self-care/home management training related to activities of daily living and compensatory training, and/or use of adaptive equipment for improvement with: ADLs and compensatory training, meal preparation,  safety procedures and instruction in use of adaptive equipment, including bathing, grooming, dressing, personal hygiene, basic household cleaning and chores.     Home Exercise Program:    []  [phone]) Reviewed/Progressed HEP activities related to strengthening, flexibility, endurance, ROM of   []  LE / Lumbar: core, proximal hip and LE for functional self-care, mobility, lifting and ambulation/stair navigation   []  UE / Cervical: cervical, postural, scapular, scapulothoracic and UE control with self care, reaching, carrying, lifting, house/yardwork, driving, computer work  []  (97112)Reviewed/Progressed HEP activities related to improving balance, coordination, kinesthetic sense, posture, motor skill, proprioception of   []  LE: core, proximal hip and LE for self care, mobility, lifting, and ambulation/stair navigation    []  UE / Cervical: cervical, postural,  scapular, scapulothoracic and UE control with self care, reaching, carrying, lifting, house/yardwork, driving, computer work    Manual Treatments:  PROM / STM / Oscillations-Mobs:  G-I, II, III, IV (PA's, Inf., Post.)  [x]  (97140) Provided manual therapy to mobilize LE, proximal hip and/or LS spine soft tissue/joints for the purpose of modulating pain, promoting relaxation,  increasing ROM, reducing/eliminating soft tissue swelling/inflammation/restriction, improving soft tissue extensibility and allowing for proper ROM for normal function with   []  LE / lumbar: self care, mobility, lifting and ambulation.    [x]  UE / Cervical: self care, reaching, carrying, lifting, house/yardwork, driving, computer work.     Modalities:  []  (55732) Vasopneumatic compression: Utilized vasopneumatic compression to decrease edema / swelling for the purpose of improving mobility and quad tone / recruitment which will allow for increased overall function including but not limited to self-care, transfers, ambulation, and ascending / descending stairs.       Modalities:  05/04/19:  Pt  was set up on lumbar traction in supine with bolsters under B knees with parameters of 60/45 lbs with on/off time of 30/10, for a total time of 10 minutes. Pt was given panic button as well as instructed how to use it if experiencing pain as well as given bell to call for needs.     05/09/19: Pt was set up on lumbar traction in supine with bolsters under B knees with parameters of 60/45 lbs with on/off time of 30/10, for a total time of 12 minutes. Pt was given panic button as well as instructed how to use it if experiencing pain as well as given bell to call for needs.     05/11/19 Pt was set up on lumbar traction in supine with bolsters under B knees with parameters of 70/50 lbs with on/off time of 30/10, for a total time of 12 minutes. Pt was given panic button as well as instructed how to use it if experiencing pain as well as given bell to call for needs. CP to low back following traction x 10 min    05/14/19: Pt was set up on lumbar traction in supine with bolsters under B knees with parameters of 70/50 lbs with on/off time of 30/10, for a total time of 12 minutes. Pt was given panic button as  well as instructed how to use it if experiencing pain as well as given bell to call for needs. CP to low back following traction x 10 min      Charges:  Timed Code Treatment Minutes: 40   Total Treatment Minutes: 40     []  EVAL - LOW (97161)   []  EVAL - MOD (02725)  []  EVAL - HIGH (97163)  []  RE-EVAL (36644)  [x]  IH(47425) x  2     []  Ionto  []  NMR (95638) x       []  Vaso  [x]  Manual (97140) x 1      []  Ultrasound  []  TA x        []  Mech Traction (75643)  []  Aquatic Therapy x      []  ES (un) (32951):   []  Home Management Training x  []  ES(attended) (88416)   []  Dry Needling 1-2 muscles (60630):  []  Dry Needling 3+ muscles (160109)  []  Group:      []  Other:     GOALS:  Patient stated goal: "to be completely out of pain"   [] ? Progressing: [] ? Met: [] ? Not Met: [] ? Adjusted    Therapist goals for Patient:   Short Term  Goals: To be achieved in: 2 weeks  1. Independent in HEP and progression per patient tolerance, in order to prevent re-injury.   [] ? Progressing: [x] ? Met: [] ? Not Met: [] ? Adjusted  2. Patient will have a decrease in pain to facilitate improvement in movement, function, and ADLs as indicated by Functional Deficits.  [] ? Progressing: [x] ? Met: [] ? Not Met: [] ? Adjusted    Long Term Goals: To be achieved in: 5 weeks  1. Disability index score of 40% or less for the NDI and Oswestry to assist with reaching prior level of function.   [x] ? Progressing: [] ? Met: [] ? Not Met: [] ? Adjusted  2. Patient will demonstrate increased AROM to [facility] Fall of cervical spine to allow for proper joint functioning as indicated by patients Functional Deficits.   [x] ? Progressing: [] ? Met: [] ? Not Met: [] ? Adjusted  3. Patient will demonstrate an increase in LE strength to 4+/5 for greater walking tolerance to improve community and social participation.   [x] ? Progressing: [] ? Met: [] ? Not Met: [] ? Adjusted  4. Patient will return to functional activities including ability to stand to cook a meal without increased symptoms or restriction.   [] ? Progressing: [] ? Met: [] ? Not Met: [] ? Adjusted  5. Patient will report ability to complete ADLs independently 75% of the time.     [] ? Progressing: [x] ? Met: [] ? Not Met: [] ? Adjusted         Overall Progression Towards Functional goals/ Treatment Progress Update:  []  Patient is progressing as expected towards functional goals listed.    []  Progression is slowed due to complexities/Impairments listed.  []  Progression has been slowed due to co-morbidities.  [x]  Plan just implemented, too soon to assess goals progression <30days   []  Goals require adjustment due to lack of progress  []  Patient is not progressing as expected and requires additional follow up with physician  []  Other    Persisting Functional  Limitations/Impairments:  [x] Sleeping [x] Sitting               [x] Standing [] Transfers        [x] Walking [] Kneeling               [] Stairs [] Squatting / bending   [] ADLs [x] Reaching  [x] Lifting  [] Housework  []   Driving [] Job related tasks  [] Sports/Recreation [] Other:        ASSESSMENT: Trialed manual cervical traction again and pt reported reduced neck pain following. Discussed possibility of mechanical cervical traction for next session; pt stated she would think about it. Will continue with general strengthening and manual for pain relief during remaining visits.   Treatment/Activity Tolerance:  []  Patient able to complete tx []  Patient limited by fatigue  [x]  Patient limited by pain  []  Patient limited by other medical complications  []  Other:     Prognosis: []  Good [x]  Fair  []  Poor    Patient Requires Follow-up: [x]  Yes  []  No    Plan for next treatment session:begin with active movement/gentle strengthening as tolerated.     PLAN: See eval. PT 2x / week for 5 weeks.   [x]  Continue per plan of care []  Alter current plan (see comments)  []  Plan of care initiated []  Hold pending MD visit []  Discharge    Electronically signed by: Towanda Octave, PT DPT    Note: If patient does not return for scheduled/ recommended follow up visits, this note will serve as a discharge from care along with most recent update on progress.

## 2019-05-24 ENCOUNTER — Inpatient Hospital Stay: Admit: 2019-05-24 | Payer: MEDICAID

## 2019-05-24 NOTE — Other (Signed)
[facility] - Outpatient Physical Therapy  Phone: [phone]   Fax: [phone]    Physical Therapy Daily Treatment Note  Date:  05/24/2019    Patient Name:  Faith Mason    DOB:  Jul 23, 1975  MRN: [id number]  Medical/Treatment Diagnosis Information:   Diagnosis: Neck pain; Bilateral shoulder pain, unspecified chronicity; Pain in both lower extremities  Treatment Diagnosis: Increased cervical and LE pain, decreased cervical and UE ROM, general UE and LE weakness, decreased tolerance for ADLs  Insurance/Certification information:  PT Insurance Information: [employer] - no precert, no deductible  Physician Information:  Referring Practitioner: Terence Lux, MD   Plan of care signed (Y/N): [x]   Yes []   No     Date of Patient follow up with Physician:      Progress Report: []   Yes  [x]   No     Date Range for reporting period:  Beginning: 05/16/19  Ending:     Progress report due (10 Rx/or 30 days whichever is less): visit #16 or 06/15/19     Recertification due (POC duration/ or 90 days whichever is less): visit #16 or 07/19/19      Visit # Insurance Allowable Auth required? Date Range   10/10  + 1/6 30 per year  13 used pre COVID []   Yes  [x]   No n/a     Latex Allergy:  [x] NO      [] YES  Preferred Language for Healthcare:   [] English       [x] other: Nepali - pt's daughter stated she would be with her at each session and would like to interpret     Functional Scale:        Date assessed:  NDI: raw score = 36; dysfunction = 72%   04/18/19  Oswestry: raw score = 28; dysfunction = 62%  04/18/19    Pain level:  4/10 R Shoulder; 5/10 legs & back    SUBJECTIVE: Pt denied interpreter phone this date, daughter present.  Pt reports that nothing new has occurred since last session.  Pain is mostly in the R UT this date.  Pt reports that she has one spot right nest to her shoulder blade that gets a burning sensation.     OBJECTIVE:  11/25: see measurements above     RESTRICTIONS/PRECAUTIONS: Pt very  guarded, slow moving     Exercises/Interventions:     Therapeutic Exercises (97110) Resistance / level Sets/sec Reps Notes   Nustep with UEs 1.0 3 min      UBE       IB  HR  12/1: Done at steps   pulleys       HSS at stairs   1 30'' B     upper trap stretch   2 30'' B    Supine Chin tucks      Standing 3 way hip       Ball on wall chin tucks     TB mid row FM  TB shd ext 25#  BLUE 2  2 10  10     Squat at ballet bar     Standing knee flexion      Lat stretch at ballet bar   Thread the needle at ballet bar  Attempted extension stretch but this increased pain    Cervical AROM flex/ext, rotation, SB      Wall slides       No money ER orange     Wall push ups  Bridge        SLR flex , ABD, ext   1 X 10 B each   LAQ     TG squats  TG single squats  2  1 10  10  B    Mini squats   Verbal and visual cues for form           Horizontal Abduction orange 2 10    Posterior shoulder rolls   20    scap squeeze   20                         Therapeutic Activities (97530)       Step ups fwd, lateral  Cues for sequencing                                                    Neuromuscular Re-ed (96045)       CC:  Paloff press     Ball on the wall red  10 R A/P, M/L, CW/CCW                               Manual Intervention (97140)       SOR, cervical pain free PROM, P/A mobs, upper trap stretch        STM B upper trap trigger point release, strain counter strain       Manual cervical traction       STM to cervical paraspinals and UTs  10 min      Scapular mobs  5 min                Pt. Education:  -patient educated on diagnosis, prognosis and expectations for rehab  -all patient questions were answered  11/25: Discussed progress made since beginning therapy. Also discussed possible causes of numbness in R shoulder blade and hands.   Home Exercise Program:  10/28: Pt instructed to continue performing HEP from last episode for now     Therapeutic Exercise and NMR EXR  [x]  (97110) Provided verbal/tactile cueing for activities related to  strengthening, flexibility, endurance, ROM for improvements in  [x]  LE / Lumbar: LE, proximal hip, and core control with self care, mobility, lifting, ambulation.  [x]  UE / Cervical: cervical, postural, scapular, scapulothoracic and UE control with self care, reaching, carrying, lifting, house/yardwork, driving, computer work.  []  [phone]) Provided verbal/tactile cueing for activities related to improving balance, coordination, kinesthetic sense, posture, motor skill, proprioception to assist with   []  LE / lumbar: LE, proximal hip, and core control in self care, mobility, lifting, ambulation and eccentric single leg control.   []  UE / cervical: cervical, scapular, scapulothoracic and UE control with self care, reaching, carrying, lifting, house/yardwork, driving, computer work.   []  [phone]) Therapist is in constant attendance of 2 or more patients providing skilled therapy interventions, but not providing any significant amount of measurable one-on-one time to either patient, for improvements in  []  LE / lumbar: LE, proximal hip, and core control in self care, mobility, lifting, ambulation and eccentric single leg control.   []  UE / cervical: cervical, scapular, scapulothoracic and UE control with self care, reaching, carrying, lifting, house/yardwork, driving, computer work.     NMR and Therapeutic Activities:    []  (  24401 or 02725) Provided verbal/tactile cueing for activities related to improving balance, coordination, kinesthetic sense, posture, motor skill, proprioception and motor activation to allow for proper function of   []  LE: / Lumbar core, proximal hip and LE with self care and ADLs  []  UE / Cervical: cervical, postural, scapular, scapulothoracic and UE control with self care, carrying, lifting, driving, computer work.   []  [phone]) Gait Re-education- Provided training and instruction to the patient for proper LE, core and proximal hip recruitment and positioning and eccentric body weight control with  ambulation re-education including up and down stairs     Home Management Training / Self Care:  []  (03474) Provided self-care/home management training related to activities of daily living and compensatory training, and/or use of adaptive equipment for improvement with: ADLs and compensatory training, meal preparation, safety procedures and instruction in use of adaptive equipment, including bathing, grooming, dressing, personal hygiene, basic household cleaning and chores.     Home Exercise Program:    []  [phone]) Reviewed/Progressed HEP activities related to strengthening, flexibility, endurance, ROM of   []  LE / Lumbar: core, proximal hip and LE for functional self-care, mobility, lifting and ambulation/stair navigation   []  UE / Cervical: cervical, postural, scapular, scapulothoracic and UE control with self care, reaching, carrying, lifting, house/yardwork, driving, computer work  []  (97112)Reviewed/Progressed HEP activities related to improving balance, coordination, kinesthetic sense, posture, motor skill, proprioception of   []  LE: core, proximal hip and LE for self care, mobility, lifting, and ambulation/stair navigation    []  UE / Cervical: cervical, postural,  scapular, scapulothoracic and UE control with self care, reaching, carrying, lifting, house/yardwork, driving, computer work    Manual Treatments:  PROM / STM / Oscillations-Mobs:  G-I, II, III, IV (PA's, Inf., Post.)  [x]  (97140) Provided manual therapy to mobilize LE, proximal hip and/or LS spine soft tissue/joints for the purpose of modulating pain, promoting relaxation,  increasing ROM, reducing/eliminating soft tissue swelling/inflammation/restriction, improving soft tissue extensibility and allowing for proper ROM for normal function with   []  LE / lumbar: self care, mobility, lifting and ambulation.    [x]  UE / Cervical: self care, reaching, carrying, lifting, house/yardwork, driving, computer work.     Modalities:  []  (38756) Vasopneumatic  compression: Utilized vasopneumatic compression to decrease edema / swelling for the purpose of improving mobility and quad tone / recruitment which will allow for increased overall function including but not limited to self-care, transfers, ambulation, and ascending / descending stairs.       Modalities:      05/04/19:  Pt was set up on lumbar traction in supine with bolsters under B knees with parameters of 60/45 lbs with on/off time of 30/10, for a total time of 10 minutes. Pt was given panic button as well as instructed how to use it if experiencing pain as well as given bell to call for needs.     05/09/19: Pt was set up on lumbar traction in supine with bolsters under B knees with parameters of 60/45 lbs with on/off time of 30/10, for a total time of 12 minutes. Pt was given panic button as well as instructed how to use it if experiencing pain as well as given bell to call for needs.     05/11/19 Pt was set up on lumbar traction in supine with bolsters under B knees with parameters of 70/50 lbs with on/off time of 30/10, for a total time of 12 minutes. Pt was given panic button as well as  instructed how to use it if experiencing pain as well as given bell to call for needs. CP to low back following traction x 10 min    05/14/19: Pt was set up on lumbar traction in supine with bolsters under B knees with parameters of 70/50 lbs with on/off time of 30/10, for a total time of 12 minutes. Pt was given panic button as well as instructed how to use it if experiencing pain as well as given bell to call for needs. CP to low back following traction x 10 min      Charges:  Timed Code Treatment Minutes: 40   Total Treatment Minutes: 40     []  EVAL - LOW (97161)   []  EVAL - MOD (16109)  []  EVAL - HIGH (60454)  []  RE-EVAL (09811)  [x]  BJ(47829) x  2     []  Ionto  []  NMR (56213) x       []  Vaso  [x]  Manual (97140) x 1      []  Ultrasound  []  TA x        []  Mech Traction (08657)  []  Aquatic Therapy x      []  ES (un) (97014):    []  Home Management Training x  []  ES(attended) (84696)   []  Dry Needling 1-2 muscles (29528):  []  Dry Needling 3+ muscles (413244)  []  Group:      []  Other:     GOALS:  Patient stated goal: "to be completely out of pain"   [] ? Progressing: [] ? Met: [] ? Not Met: [] ? Adjusted    Therapist goals for Patient:   Short Term Goals: To be achieved in: 2 weeks  1. Independent in HEP and progression per patient tolerance, in order to prevent re-injury.   [] ? Progressing: [x] ? Met: [] ? Not Met: [] ? Adjusted  2. Patient will have a decrease in pain to facilitate improvement in movement, function, and ADLs as indicated by Functional Deficits.  [] ? Progressing: [x] ? Met: [] ? Not Met: [] ? Adjusted    Long Term Goals: To be achieved in: 5 weeks  1. Disability index score of 40% or less for the NDI and Oswestry to assist with reaching prior level of function.   [x] ? Progressing: [] ? Met: [] ? Not Met: [] ? Adjusted  2. Patient will demonstrate increased AROM to [facility] of cervical spine to allow for proper joint functioning as indicated by patients Functional Deficits.   [x] ? Progressing: [] ? Met: [] ? Not Met: [] ? Adjusted  3. Patient will demonstrate an increase in LE strength to 4+/5 for greater walking tolerance to improve community and social participation.   [x] ? Progressing: [] ? Met: [] ? Not Met: [] ? Adjusted  4. Patient will return to functional activities including ability to stand to cook a meal without increased symptoms or restriction.   [] ? Progressing: [] ? Met: [] ? Not Met: [] ? Adjusted  5. Patient will report ability to complete ADLs independently 75% of the time.     [] ? Progressing: [x] ? Met: [] ? Not Met: [] ? Adjusted         Overall Progression Towards Functional goals/ Treatment Progress Update:  []  Patient is progressing as expected towards functional goals listed.    []  Progression is slowed due to complexities/Impairments listed.  []  Progression has been slowed due to co-morbidities.  [x]  Plan just implemented,  too soon to assess goals progression <30days   []  Goals require adjustment due to lack of progress  []  Patient is not progressing as expected and requires additional follow  up with physician  []  Other    Persisting Functional Limitations/Impairments:  [x] Sleeping [x] Sitting               [x] Standing [] Transfers        [x] Walking [] Kneeling               [] Stairs [] Squatting / bending   [] ADLs [x] Reaching  [x] Lifting  [] Housework  [] Driving [] Job related tasks  [] Sports/Recreation [] Other:        ASSESSMENT: Pt reports burning sensation in the R upper trap.  Focused session on scapular strengthening and manual to relieve this.  Pt reports that exercises fatigued the area, unable to state if manual changed symptoms, states that she will report back NV.      Treatment/Activity Tolerance:  []  Patient able to complete tx []  Patient limited by fatigue  [x]  Patient limited by pain  []  Patient limited by other medical complications  []  Other:     Prognosis: []  Good [x]  Fair  []  Poor    Patient Requires Follow-up: [x]  Yes  []  No    Plan for next treatment session:begin with active movement/gentle strengthening as tolerated.     PLAN: See eval. PT 2x / week for 5 weeks.   [x]  Continue per plan of care []  Alter current plan (see comments)  []  Plan of care initiated []  Hold pending MD visit []  Discharge    Electronically signed by: Towanda Octave, PT DPT    Note: If patient does not return for scheduled/ recommended follow up visits, this note will serve as a discharge from care along with most recent update on progress.

## 2019-05-28 ENCOUNTER — Ambulatory Visit: Admit: 2019-05-28 | Discharge: 2019-05-28 | Payer: MEDICAID | Attending: Surgical

## 2019-05-28 DIAGNOSIS — M5412 Radiculopathy, cervical region: Secondary | ICD-10-CM

## 2019-05-29 ENCOUNTER — Inpatient Hospital Stay: Admit: 2019-05-29 | Payer: MEDICAID

## 2019-05-29 NOTE — Progress Notes (Signed)
Subjective:      Patient ID: Faith Mason is a 43 y.o.  female.    Chief Complaint   Patient presents with   ??? Results     MRI results cervical spine      Patient does not speak English therefore a medical interpreter was utilized.    HPI: She is here for follow-up on her cervical spine MRI.  She continues to have significant cervical radicular complaints right upper extremity.  No change in symptoms.  No new symptoms reported.  She denies any balance disturbance.    Review of Systems:   Negative for fever or chills.  Positive for numbness, tingling and weakness right upper extremity.    Past Medical History:   Diagnosis Date   ??? GERD (gastroesophageal reflux disease)    ??? Hypertension 2016       Family History   Problem Relation Age of Onset   ??? High Blood Pressure Mother    ??? Diabetes Mother    ??? High Blood Pressure Father    ??? Diabetes Father        Past Surgical History:   Procedure Laterality Date   ??? BACK SURGERY  2017    L-spine       Social History     Occupational History   ??? Not on file   Tobacco Use   ??? Smoking status: Never Smoker   ??? Smokeless tobacco: Never Used   Substance and Sexual Activity   ??? Alcohol use: Never     Frequency: Never   ??? Drug use: Never   ??? Sexual activity: Yes     Comment: married-4 children        Current Outpatient Medications   Medication Sig Dispense Refill   ??? lisinopril-hydroCHLOROthiazide (PRINZIDE;ZESTORETIC) 10-12.5 MG per tablet Take 1 tablet by mouth daily 30 tablet 0   ??? blood glucose monitor strips Test 2times a day 100 strip 4   ??? glucose monitoring kit (FREESTYLE) monitoring kit 1 each by Does not apply route once for 1 dose Glucometer:QD-BID testing. Ok to provide insurance covered device. 1 kit 0   ??? Lancets MISC BID testing 100 each 6     No current facility-administered medications for this visit.          Objective:   She is alert, oriented x 3, pleasant, well nourished, developed and in no acute distress.    Temp 97.7 ??F (36.5 ??C) (Temporal)        Cervical  Spine Exam:  There??is not??deformity.  There??is????loss of motion.  There??is????muscular spasm.   There??is????trapezius/ rhomboid tenderness.  There??is not??spinous process tenderness.  There??is not??cervical lymphadenopathy.  Spurling Test is??Positive.  ??  Examination of the upper extremities are intact with sensation to light touch.  Motor testing shows a decreased grip strength bilaterally.  Otherwise??5/5 in all major motor groups.   Radial, Median and Ulnar nerves are intact.  Hoffman's Sign absent.??  Decreased sensation to light touch noted in both left and right hand.  Hoffman's sign negative bilateral upper and lower extremities.  ??  Examination of the upper extremities shows intact perfusion to all extremities.  No cyanosis.  Digits are warm to touch.  Capillary refill is less than 2 seconds.   There is??no??edema noted.   ??  Examination of the skin over the upper extremities:  Reveals the skin to be intact without lacerations or abrasions.   There are no significant erythema, rashes or skin lesions.??  ??  MRI: Obtained from [facility] or an outside facility.  Impression    Degenerative thecal sac narrowing and neural foraminal stenosis from C3-C4 to    C6-C7.    ??    Slightly high signal intensity within the cervical cord on the sagittal STIR    sequence favored to represent artifact. ??However, recommend repeating the    study with IV contrast to confirm.          X Rays: not performed in the office today:     Diagnosis:        ICD-10-CM    1. Cervical radicular pain  M54.12 Amb External Referral To Pain Clinic   2. Foraminal stenosis of cervical region  M48.02 Amb External Referral To Pain Clinic   3. Cervical stenosis of spinal canal  M48.02 Amb External Referral To Pain Clinic        Assessment/ Plan:      She has degenerative thecal sac narrowing and neuroforaminal stenosis at C3-4 to C6-7.  MRI shows sella high signal intensity within the cervical cord on sagittal STIR sequence favored to represent artifact.  Visual  exam does not demonstrate anything concerning for myelomalacia.    The natural history of the patient's diagnosis as well as the treatment options were discussed in full and questions were answered. Risks and benefits of the treatment options also reviewed in detail.       I did spend 25 minutes in the office today with greater than 50% of this time counseling, reviewing diagnostic tests, face to face discussion concerning their diagnosis and treatment options. All of their questions were answered.    At this time, I do not believe spinal surgery is indicated. She may benefit other therapeutic options such as epidural steroid injection, facet injection or other interventional procedures.  For this reason, I am going to refer to Dr. Rosanne Gutting for Interventional Pain Management for an evaluation and treatment.        Follow Up:   Call or return to clinic prn if these symptoms worsen or fail to improve as anticipated.

## 2019-05-29 NOTE — Other (Signed)
[facility]  Phone: [phone]   Fax: [phone]    Physical Therapy Daily Treatment Note  Date:  05/29/2019    Patient Name:  Faith Mason    DOB:  09-07-1975  MRN: [id number]  Medical/Treatment Diagnosis Information:  ?? Diagnosis: Neck pain; Bilateral shoulder pain, unspecified chronicity; Pain in both lower extremities  Treatment Diagnosis: Increased cervical and LE pain, decreased cervical and UE ROM, general UE and LE weakness, decreased tolerance for ADLs  Insurance/Certification information:  PT Insurance Information: [employer] - no precert, no deductible  Physician Information:  Referring Practitioner: [name], MD   Plan of care signed (Y/N): '[x]'   Yes '[]'   No     Date of Patient follow up with Physician:      Progress Report: '[]'   Yes  '[x]'   No     Date Range for reporting period:  Beginning: 05/16/19  Ending:     Progress report due (10 Rx/or 30 days whichever is less): visit #16 or 03/47/42     Recertification due (POC duration/ or 90 days whichever is less): visit #16 or 07/19/19      Visit # Insurance Allowable Auth required? Date Range   10/10  + 2/6 30 per year  13 used pre COVID '[]'   Yes  '[x]'   No n/a     Latex Allergy:  '[x]' NO      '[]' YES  Preferred Language for Healthcare:   '[]' English       '[x]' other: Nepali - pt's daughter stated she would be with her at each session and would like to interpret     Functional Scale:        Date assessed:  NDI: raw score = 36; dysfunction = 72%   04/18/19  Oswestry: raw score = 28; dysfunction = 62%  04/18/19    Pain level:  5-6/10 R Shoulder; 5-6/10 legs & back    SUBJECTIVE: Pt denied interpreter phone this date, daughter present.  Pt with no new complaints today. Pt's daughter states that she was recommended an injection at the MD yesterday but she needs to call and schedule it. Manual therapy from last session reduced pain but not burning sensation.      OBJECTIVE:    12/8: Pt's daughter reports that pt is  working on her English so her daughter will interpret when needed   11/25: see measurements above     RESTRICTIONS/PRECAUTIONS: Pt very guarded, slow moving     Exercises/Interventions:     Therapeutic Exercises [phone]) Resistance / level Sets/sec Reps Notes   Nustep with UEs 1.0       UBE  2 min fwd/2 min retro     IB  HR  12/1: Done at steps   pulleys       HSS at stairs   1 30'' B     upper trap stretch   2 30'' B    Supine Chin tucks      Standing 3 way hip       Ball on wall chin tucks     TB mid row FM  TB shd ext  TB high row FM 25#  25#  25# '2  2  2 10  10  10    ' Squat at ballet bar     Standing knee flexion      Lat stretch at ballet bar   Thread the needle at ballet bar  Attempted extension stretch  but this increased pain    Cervical AROM flex/ext, rotation, SB      Wall slides       No money ER orange     Wall push ups       Bridge        SLR flex , ABD, ext   each   LAQ     TG squats  TG single squats     Mini squats   Verbal and visual cues for form           Horizontal Abduction orange    Posterior shoulder rolls     scap squeeze                          Therapeutic Activities (97530)       Step ups fwd, lateral  Cues for sequencing                                                    Neuromuscular Re-ed (74081)       CC:  Paloff press     Ball on the wall red   A/P, M/L, CW/CCW   Prone:  ?? Y's, T's, I's     X 15 B TCs for form   Wall angels   X 15                  Manual Intervention (97140)       SOR, cervical pain free PROM, P/A mobs, upper trap stretch        STM B upper trap trigger point release, strain counter strain       Manual cervical traction       STM to cervical paraspinals and UTs       Scapular mobs       STM to thoracic paraspinals/rhomboids, Grade 2-3 thoracic PA mobs   10 min         Pt. Education:  -patient educated on diagnosis, prognosis and expectations for rehab  -all patient questions were answered  11/25: Discussed progress made since beginning therapy. Also discussed possible  causes of numbness in R shoulder blade and hands.     Home Exercise Program:  10/28: Pt instructed to continue performing HEP from last episode for now     Therapeutic Exercise and NMR EXR  '[x]'  (97110) Provided verbal/tactile cueing for activities related to strengthening, flexibility, endurance, ROM for improvements in  '[x]'  LE / Lumbar: LE, proximal hip, and core control with self care, mobility, lifting, ambulation.  '[x]'  UE / Cervical: cervical, postural, scapular, scapulothoracic and UE control with self care, reaching, carrying, lifting, house/yardwork, driving, computer work.  '[]'  [phone]) Provided verbal/tactile cueing for activities related to improving balance, coordination, kinesthetic sense, posture, motor skill, proprioception to assist with   '[]'  LE / lumbar: LE, proximal hip, and core control in self care, mobility, lifting, ambulation and eccentric single leg control.   '[]'  UE / cervical: cervical, scapular, scapulothoracic and UE control with self care, reaching, carrying, lifting, house/yardwork, driving, computer work.   '[]'  [phone]) Therapist is in constant attendance of 2 or more patients providing skilled therapy interventions, but not providing any significant amount of measurable one-on-one time to either patient, for improvements in  '[]'  LE / lumbar: LE, proximal hip, and core  control in self care, mobility, lifting, ambulation and eccentric single leg control.   '[]'  UE / cervical: cervical, scapular, scapulothoracic and UE control with self care, reaching, carrying, lifting, house/yardwork, driving, computer work.     NMR and Therapeutic Activities:    '[x]'  [phone] or 97530) Provided verbal/tactile cueing for activities related to improving balance, coordination, kinesthetic sense, posture, motor skill, proprioception and motor activation to allow for proper function of   '[]'  LE: / Lumbar core, proximal hip and LE with self care and ADLs  '[x]'  UE / Cervical: cervical, postural, scapular, scapulothoracic  and UE control with self care, carrying, lifting, driving, computer work.   '[]'  [phone]) Gait Re-education- Provided training and instruction to the patient for proper LE, core and proximal hip recruitment and positioning and eccentric body weight control with ambulation re-education including up and down stairs     Home Management Training / Self Care:  '[]'  (09811) Provided self-care/home management training related to activities of daily living and compensatory training, and/or use of adaptive equipment for improvement with: ADLs and compensatory training, meal preparation, safety procedures and instruction in use of adaptive equipment, including bathing, grooming, dressing, personal hygiene, basic household cleaning and chores.     Home Exercise Program:    '[]'  [phone]) Reviewed/Progressed HEP activities related to strengthening, flexibility, endurance, ROM of   '[]'  LE / Lumbar: core, proximal hip and LE for functional self-care, mobility, lifting and ambulation/stair navigation   '[]'  UE / Cervical: cervical, postural, scapular, scapulothoracic and UE control with self care, reaching, carrying, lifting, house/yardwork, driving, computer work  '[]'  (97112)Reviewed/Progressed HEP activities related to improving balance, coordination, kinesthetic sense, posture, motor skill, proprioception of   '[]'  LE: core, proximal hip and LE for self care, mobility, lifting, and ambulation/stair navigation    '[]'  UE / Cervical: cervical, postural,  scapular, scapulothoracic and UE control with self care, reaching, carrying, lifting, house/yardwork, driving, computer work    Manual Treatments:  PROM / STM / Oscillations-Mobs:  G-I, II, III, IV (PA's, Inf., Post.)  '[x]'  (97140) Provided manual therapy to mobilize LE, proximal hip and/or LS spine soft tissue/joints for the purpose of modulating pain, promoting relaxation,  increasing ROM, reducing/eliminating soft tissue swelling/inflammation/restriction, improving soft tissue extensibility and  allowing for proper ROM for normal function with   '[]'  LE / lumbar: self care, mobility, lifting and ambulation.    '[x]'  UE / Cervical: self care, reaching, carrying, lifting, house/yardwork, driving, computer work.     Modalities:  '[]'  (29562) Vasopneumatic compression: Utilized vasopneumatic compression to decrease edema / swelling for the purpose of improving mobility and quad tone / recruitment which will allow for increased overall function including but not limited to self-care, transfers, ambulation, and ascending / descending stairs.       Modalities:  12/8: Supine on MHP to upper back x 10 min     05/04/19:  Pt was set up on lumbar traction in supine with bolsters under B knees with parameters of 60/45 lbs with on/off time of 30/10, for a total time of 10 minutes. Pt was given panic button as well as instructed how to use it if experiencing pain as well as given bell to call for needs.     05/09/19: Pt was set up on lumbar traction in supine with bolsters under B knees with parameters of 60/45 lbs with on/off time of 30/10, for a total time of 12 minutes. Pt was given panic button as well as instructed how to use it  if experiencing pain as well as given bell to call for needs.     05/11/19 Pt was set up on lumbar traction in supine with bolsters under B knees with parameters of 70/50 lbs with on/off time of 30/10, for a total time of 12 minutes. Pt was given panic button as well as instructed how to use it if experiencing pain as well as given bell to call for needs. CP to low back following traction x 10 min    05/14/19: Pt was set up on lumbar traction in supine with bolsters under B knees with parameters of 70/50 lbs with on/off time of 30/10, for a total time of 12 minutes. Pt was given panic button as well as instructed how to use it if experiencing pain as well as given bell to call for needs. CP to low back following traction x 10 min      Charges:  Timed Code Treatment Minutes: 40   Total Treatment  Minutes: 50     '[]'  EVAL - LOW (97161)   '[]'  EVAL - MOD (03474)  '[]'  EVAL - HIGH (97163)  '[]'  RE-EVAL (25956)  '[x]'  LO(75643) x  1     '[]'  Ionto  '[x]'  NMR (32951) x  1     '[]'  Vaso  '[x]'  Manual (97140) x 1      '[]'  Ultrasound  '[]'  TA x        '[]'  Mech Traction (88416)  '[]'  Aquatic Therapy x      '[]'  ES (un) (60630):   '[]'  Home Management Training x  '[]'  ES(attended) (16010)   '[]'  Dry Needling 1-2 muscles (93235):  '[]'  Dry Needling 3+ muscles (573220)  '[]'  Group:      '[]'  Other:     GOALS:  Patient stated goal: "to be completely out of pain"   '[]' ? Progressing: '[]' ? Met: '[]' ? Not Met: '[]' ? Adjusted  ??  Therapist goals for Patient:   Short Term Goals: To be achieved in: 2 weeks  1. Independent in HEP and progression per patient tolerance, in order to prevent re-injury.   '[]' ? Progressing: '[x]' ? Met: '[]' ? Not Met: '[]' ? Adjusted  2. Patient will have a decrease in pain to facilitate improvement in movement, function, and ADLs as indicated by Functional Deficits.  '[]' ? Progressing: '[x]' ? Met: '[]' ? Not Met: '[]' ? Adjusted  ??  Long Term Goals: To be achieved in: 5 weeks  1. Disability index score of 40% or less for the NDI and Oswestry to assist with reaching prior level of function.   '[x]' ? Progressing: '[]' ? Met: '[]' ? Not Met: '[]' ? Adjusted  2. Patient will demonstrate increased AROM to [facility] of cervical spine to allow for proper joint functioning as indicated by patients Functional Deficits.   '[x]' ? Progressing: '[]' ? Met: '[]' ? Not Met: '[]' ? Adjusted  3. Patient will demonstrate an increase in LE strength to 4+/5 for greater walking tolerance to improve community and social participation.   '[x]' ? Progressing: '[]' ? Met: '[]' ? Not Met: '[]' ? Adjusted  4. Patient will return to functional activities including ability to stand to cook a meal without increased symptoms or restriction.   '[]' ? Progressing: '[]' ? Met: '[]' ? Not Met: '[]' ? Adjusted  5. Patient will report ability to complete ADLs independently 75% of the time.     '[]' ? Progressing: '[x]' ? Met: '[]' ? Not Met: '[]' ? Adjusted          Overall Progression Towards Functional goals/ Treatment Progress Update:  '[]'  Patient is progressing as expected towards functional goals  listed.    '[]'  Progression is slowed due to complexities/Impairments listed.  '[]'  Progression has been slowed due to co-morbidities.  '[x]'  Plan just implemented, too soon to assess goals progression <30days   '[]'  Goals require adjustment due to lack of progress  '[]'  Patient is not progressing as expected and requires additional follow up with physician  '[]'  Other    Persisting Functional Limitations/Impairments:  '[x]' Sleeping '[x]' Sitting               '[x]' Standing '[]' Transfers        '[x]' Walking '[]' Kneeling               '[]' Stairs '[]' Squatting / bending   '[]' ADLs '[x]' Reaching  '[x]' Lifting  '[]' Housework  '[]' Driving '[]' Job related tasks  '[]' Sports/Recreation '[]' Other:        ASSESSMENT:  Pt again quite fatigued after scapular strengthening, but did not have any increased pain. Pt to benefit from continued strengthening of this area for the remaining visits to improve posture and reduce tension on tissues.      Treatment/Activity Tolerance:  '[]'  Patient able to complete tx '[]'  Patient limited by fatigue  '[x]'  Patient limited by pain  '[]'  Patient limited by other medical complications  '[]'  Other:     Prognosis: '[]'  Good '[x]'  Fair  '[]'  Poor    Patient Requires Follow-up: '[x]'  Yes  '[]'  No    Plan for next treatment session:begin with active movement/gentle strengthening as tolerated.     PLAN: See eval. PT 2x / week for 5 weeks.   '[x]'  Continue per plan of care '[]'  Alter current plan (see comments)  '[]'  Plan of care initiated '[]'  Hold pending MD visit '[]'  Discharge    Electronically signed by: Corlis Leak, PT DPT    Note: If patient does not return for scheduled/ recommended follow up visits, this note will serve as a discharge from care along with most recent update on progress.

## 2019-05-31 ENCOUNTER — Inpatient Hospital Stay: Admit: 2019-05-31 | Payer: MEDICAID

## 2019-05-31 NOTE — Other (Signed)
[facility] - Outpatient Physical Therapy  Phone: [phone]   Fax: [phone]    Physical Therapy Daily Treatment Note  Date:  05/31/2019    Patient Name:  Faith Mason    DOB:  February 16, 1976  MRN: [id number]  Medical/Treatment Diagnosis Information:   Diagnosis: Neck pain; Bilateral shoulder pain, unspecified chronicity; Pain in both lower extremities  Treatment Diagnosis: Increased cervical and LE pain, decreased cervical and UE ROM, general UE and LE weakness, decreased tolerance for ADLs  Insurance/Certification information:  PT Insurance Information: [employer] - no precert, no deductible  Physician Information:  Referring Practitioner: Terence Lux, MD   Plan of care signed (Y/N): [x]   Yes []   No     Date of Patient follow up with Physician:      Progress Report: []   Yes  [x]   No     Date Range for reporting period:  Beginning: 05/16/19  Ending:     Progress report due (10 Rx/or 30 days whichever is less): visit #16 or 06/15/19     Recertification due (POC duration/ or 90 days whichever is less): visit #16 or 07/19/19      Visit # Insurance Allowable Auth required? Date Range   10/10  + 3/6 30 per year  13 used pre COVID []   Yes  [x]   No n/a     Latex Allergy:  [x] NO      [] YES  Preferred Language for Healthcare:   [] English       [x] other: Nepali - pt's daughter stated she would be with her at each session and would like to interpret     Functional Scale:        Date assessed:  NDI: raw score = 36; dysfunction = 72%   04/18/19  Oswestry: raw score = 28; dysfunction = 62%  04/18/19    Pain level:  5-6/10 R Shoulder; 5-6/10 legs & back    SUBJECTIVE: Pt denied interpreter phone this date, daughter present.  Pt reports that they called to schedule the injection but the doctor that was recommended does not take their insurance.     OBJECTIVE:    12/8: Pt's daughter reports that pt is working on her English so her daughter will interpret when needed   11/25: see measurements above      RESTRICTIONS/PRECAUTIONS: Pt very guarded, slow moving     Exercises/Interventions:     Therapeutic Exercises [phone]) Resistance / level Sets/sec Reps Notes   Nustep with UEs 1.0       UBE  2 min fwd/2 min retro     IB  HR  12/1: Done at steps   pulleys       HSS at stairs   1 30'' B     upper trap stretch   2 30'' B    Supine Chin tucks      Standing 3 way hip       Ball on wall chin tucks     TB mid row FM  TB shd ext  TB high row FM 25#  25#  25# 2  2  2 10  10  10  Increase weight NV   Squat at ballet bar     Standing knee flexion      Lat stretch at ballet bar   Thread the needle at ballet bar  Attempted extension stretch but this increased pain    Cervical AROM flex/ext, rotation, SB  Wall slides       No money ER orange     Wall push ups       Bridge        SLR flex , ABD, ext   each   LAQ     TG squats  TG single squats     Mini squats   Verbal and visual cues for form           Horizontal Abduction orange    Posterior shoulder rolls     scap squeeze                          Therapeutic Activities (97530)       Step ups fwd, lateral  Cues for sequencing                                                    Neuromuscular Re-ed (96295)       CC:  Paloff press     Ball on the wall red   A/P, M/L, CW/CCW   Prone:   Y's, T's, I's  TCs for form   Wall angels   X 15    bosu lunges Fwd lat 1-2 sec x10 B           Manual Intervention (97140)       SOR, cervical pain free PROM, P/A mobs, upper trap stretch        STM B upper trap trigger point release, strain counter strain       Manual cervical traction       STM to cervical paraspinals and UTs       Scapular mobs                     STM to thoracic paraspinals/rhomboids, Grade 2-3 thoracic PA mobs   10 min         Pt. Education:  -patient educated on diagnosis, prognosis and expectations for rehab  -all patient questions were answered  11/25: Discussed progress made since beginning therapy. Also discussed possible causes of numbness in R shoulder blade and  hands.     Home Exercise Program:  10/28: Pt instructed to continue performing HEP from last episode for now     Therapeutic Exercise and NMR EXR  [x]  (97110) Provided verbal/tactile cueing for activities related to strengthening, flexibility, endurance, ROM for improvements in  [x]  LE / Lumbar: LE, proximal hip, and core control with self care, mobility, lifting, ambulation.  [x]  UE / Cervical: cervical, postural, scapular, scapulothoracic and UE control with self care, reaching, carrying, lifting, house/yardwork, driving, computer work.  []  [phone]) Provided verbal/tactile cueing for activities related to improving balance, coordination, kinesthetic sense, posture, motor skill, proprioception to assist with   []  LE / lumbar: LE, proximal hip, and core control in self care, mobility, lifting, ambulation and eccentric single leg control.   []  UE / cervical: cervical, scapular, scapulothoracic and UE control with self care, reaching, carrying, lifting, house/yardwork, driving, computer work.   []  [phone]) Therapist is in constant attendance of 2 or more patients providing skilled therapy interventions, but not providing any significant amount of measurable one-on-one time to either patient, for improvements in  []  LE / lumbar: LE, proximal hip, and core control in self care, mobility,  lifting, ambulation and eccentric single leg control.   []  UE / cervical: cervical, scapular, scapulothoracic and UE control with self care, reaching, carrying, lifting, house/yardwork, driving, computer work.     NMR and Therapeutic Activities:    [x]  [phone] or [phone]) Provided verbal/tactile cueing for activities related to improving balance, coordination, kinesthetic sense, posture, motor skill, proprioception and motor activation to allow for proper function of   []  LE: / Lumbar core, proximal hip and LE with self care and ADLs  [x]  UE / Cervical: cervical, postural, scapular, scapulothoracic and UE control with self care, carrying,  lifting, driving, computer work.   []  [phone]) Gait Re-education- Provided training and instruction to the patient for proper LE, core and proximal hip recruitment and positioning and eccentric body weight control with ambulation re-education including up and down stairs     Home Management Training / Self Care:  []  (96295) Provided self-care/home management training related to activities of daily living and compensatory training, and/or use of adaptive equipment for improvement with: ADLs and compensatory training, meal preparation, safety procedures and instruction in use of adaptive equipment, including bathing, grooming, dressing, personal hygiene, basic household cleaning and chores.     Home Exercise Program:    []  [phone]) Reviewed/Progressed HEP activities related to strengthening, flexibility, endurance, ROM of   []  LE / Lumbar: core, proximal hip and LE for functional self-care, mobility, lifting and ambulation/stair navigation   []  UE / Cervical: cervical, postural, scapular, scapulothoracic and UE control with self care, reaching, carrying, lifting, house/yardwork, driving, computer work  []  (97112)Reviewed/Progressed HEP activities related to improving balance, coordination, kinesthetic sense, posture, motor skill, proprioception of   []  LE: core, proximal hip and LE for self care, mobility, lifting, and ambulation/stair navigation    []  UE / Cervical: cervical, postural,  scapular, scapulothoracic and UE control with self care, reaching, carrying, lifting, house/yardwork, driving, computer work    Manual Treatments:  PROM / STM / Oscillations-Mobs:  G-I, II, III, IV (PA's, Inf., Post.)  [x]  (97140) Provided manual therapy to mobilize LE, proximal hip and/or LS spine soft tissue/joints for the purpose of modulating pain, promoting relaxation,  increasing ROM, reducing/eliminating soft tissue swelling/inflammation/restriction, improving soft tissue extensibility and allowing for proper ROM for normal  function with   []  LE / lumbar: self care, mobility, lifting and ambulation.    [x]  UE / Cervical: self care, reaching, carrying, lifting, house/yardwork, driving, computer work.     Modalities:  []  (24401) Vasopneumatic compression: Utilized vasopneumatic compression to decrease edema / swelling for the purpose of improving mobility and quad tone / recruitment which will allow for increased overall function including but not limited to self-care, transfers, ambulation, and ascending / descending stairs.       Modalities:  12/8: Supine on MHP to upper back x 10 min     05/04/19:  Pt was set up on lumbar traction in supine with bolsters under B knees with parameters of 60/45 lbs with on/off time of 30/10, for a total time of 10 minutes. Pt was given panic button as well as instructed how to use it if experiencing pain as well as given bell to call for needs.     05/09/19: Pt was set up on lumbar traction in supine with bolsters under B knees with parameters of 60/45 lbs with on/off time of 30/10, for a total time of 12 minutes. Pt was given panic button as well as instructed how to use it if experiencing pain as well  as given bell to call for needs.     05/11/19 Pt was set up on lumbar traction in supine with bolsters under B knees with parameters of 70/50 lbs with on/off time of 30/10, for a total time of 12 minutes. Pt was given panic button as well as instructed how to use it if experiencing pain as well as given bell to call for needs. CP to low back following traction x 10 min    12/10,  05/14/19: Pt was set up on lumbar traction in supine with bolsters under B knees with parameters of 70/50 lbs with on/off time of 30/10, for a total time of 12 minutes. Pt was given panic button as well as instructed how to use it if experiencing pain as well as given bell to call for needs. CP to low back following traction x 10 min      Charges:  Timed Code Treatment Minutes: 40   Total Treatment Minutes: 50     []  EVAL - LOW  (97161)   []  EVAL - MOD (09811)  []  EVAL - HIGH (97163)  []  RE-EVAL (91478)  [x]  GN(56213) x  1     []  Ionto  [x]  NMR (97112) x  1     []  Vaso  [x]  Manual (97140) x 1      []  Ultrasound  []  TA x        [x]  Mech Traction (08657)  []  Aquatic Therapy x      []  ES (un) (97014):   []  Home Management Training x  []  ES(attended) (84696)   []  Dry Needling 1-2 muscles (29528):  []  Dry Needling 3+ muscles (413244)  []  Group:      []  Other:     GOALS:  Patient stated goal: "to be completely out of pain"   [] ? Progressing: [] ? Met: [] ? Not Met: [] ? Adjusted    Therapist goals for Patient:   Short Term Goals: To be achieved in: 2 weeks  1. Independent in HEP and progression per patient tolerance, in order to prevent re-injury.   [] ? Progressing: [x] ? Met: [] ? Not Met: [] ? Adjusted  2. Patient will have a decrease in pain to facilitate improvement in movement, function, and ADLs as indicated by Functional Deficits.  [] ? Progressing: [x] ? Met: [] ? Not Met: [] ? Adjusted    Long Term Goals: To be achieved in: 5 weeks  1. Disability index score of 40% or less for the NDI and Oswestry to assist with reaching prior level of function.   [x] ? Progressing: [] ? Met: [] ? Not Met: [] ? Adjusted  2. Patient will demonstrate increased AROM to [facility] of cervical spine to allow for proper joint functioning as indicated by patients Functional Deficits.   [x] ? Progressing: [] ? Met: [] ? Not Met: [] ? Adjusted  3. Patient will demonstrate an increase in LE strength to 4+/5 for greater walking tolerance to improve community and social participation.   [x] ? Progressing: [] ? Met: [] ? Not Met: [] ? Adjusted  4. Patient will return to functional activities including ability to stand to cook a meal without increased symptoms or restriction.   [] ? Progressing: [] ? Met: [] ? Not Met: [] ? Adjusted  5. Patient will report ability to complete ADLs independently 75% of the time.     [] ? Progressing: [x] ? Met: [] ? Not Met: [] ? Adjusted         Overall Progression  Towards Functional goals/ Treatment Progress Update:  []  Patient is progressing as expected towards functional goals listed.    []   Progression is slowed due to complexities/Impairments listed.  []  Progression has been slowed due to co-morbidities.  [x]  Plan just implemented, too soon to assess goals progression <30days   []  Goals require adjustment due to lack of progress  []  Patient is not progressing as expected and requires additional follow up with physician  []  Other    Persisting Functional Limitations/Impairments:  [x] Sleeping [x] Sitting               [x] Standing [] Transfers        [x] Walking [] Kneeling               [] Stairs [] Squatting / bending   [] ADLs [x] Reaching  [x] Lifting  [] Housework  [] Driving [] Job related tasks  [] Sports/Recreation [] Other:        ASSESSMENT:  Pt continues to experience pain throughout session. Pt would benefit from injection however having difficulty finding MD who takes their insurance.  Will continue to progress as able.     Treatment/Activity Tolerance:  []  Patient able to complete tx []  Patient limited by fatigue  [x]  Patient limited by pain  []  Patient limited by other medical complications  []  Other:     Prognosis: []  Good [x]  Fair  []  Poor    Patient Requires Follow-up: [x]  Yes  []  No    Plan for next treatment session:begin with active movement/gentle strengthening as tolerated.     PLAN: See eval. PT 2x / week for 5 weeks.   [x]  Continue per plan of care []  Alter current plan (see comments)  []  Plan of care initiated []  Hold pending MD visit []  Discharge    Electronically signed by: Jarvis Morgan, PT DPT    Note: If patient does not return for scheduled/ recommended follow up visits, this note will serve as a discharge from care along with most recent update on progress.

## 2019-06-05 ENCOUNTER — Telehealth

## 2019-06-05 ENCOUNTER — Inpatient Hospital Stay: Admit: 2019-06-05 | Payer: MEDICAID

## 2019-06-05 NOTE — Telephone Encounter (Signed)
Other PATIENT CALLING STATING THE REFERRAL SHE RECD. THE DR. DOES NOT ACCEPT HER INSURANCE, WOULD LIKE TO BE REFERRED TO A DIFFERENT PHYSICIAN., CALLBACK [phone].

## 2019-06-05 NOTE — Telephone Encounter (Signed)
Called and informed patient that referral was sent to Dr. Jerl Santos

## 2019-06-05 NOTE — Other (Signed)
[facility] - Outpatient Physical Therapy  Phone: [phone]   Fax: [phone]    Physical Therapy Daily Treatment Note  Date:  06/05/2019    Patient Name:  Faith Mason    DOB:  03-Aug-1975  MRN: [id number]  Medical/Treatment Diagnosis Information:   Diagnosis: Neck pain; Bilateral shoulder pain, unspecified chronicity; Pain in both lower extremities  Treatment Diagnosis: Increased cervical and LE pain, decreased cervical and UE ROM, general UE and LE weakness, decreased tolerance for ADLs  Insurance/Certification information:  PT Insurance Information: [employer] - no precert, no deductible  Physician Information:  Referring Practitioner: Terence Lux, MD   Plan of care signed (Y/N): [x]   Yes []   No     Date of Patient follow up with Physician:      Progress Report: []   Yes  [x]   No     Date Range for reporting period:  Beginning: 05/16/19  Ending:     Progress report due (10 Rx/or 30 days whichever is less): visit #16 or 06/15/19     Recertification due (POC duration/ or 90 days whichever is less): visit #16 or 07/19/19      Visit # Insurance Allowable Auth required? Date Range   10/10  + 4/6 30 per year  13 used pre COVID []   Yes  [x]   No n/a     Latex Allergy:  [x] NO      [] YES  Preferred Language for Healthcare:   [] English       [x] other: Nepali - pt's daughter stated she would be with her at each session and would like to interpret     Functional Scale:        Date assessed:  NDI: raw score = 36; dysfunction = 72%   04/18/19  Oswestry: raw score = 28; dysfunction = 62%  04/18/19    Pain level:  5/10 R Shoulder; 3-4/10 burning in legs     SUBJECTIVE: Pt denied interpreter phone this date, daughter present. Pt reports that she is still having a lot of burning into the leg at night.     OBJECTIVE:    12/8: Pt's daughter reports that pt is working on her English so her daughter will interpret when needed   11/25: see measurements above     RESTRICTIONS/PRECAUTIONS: Pt very guarded,  slow moving     Exercises/Interventions:     Therapeutic Exercises [phone]) Resistance / level Sets/sec Reps Notes   Nustep with UEs 4.0 3 min      UBE     IB  HR  12/1: Done at steps   pulleys       HSS at stairs   2 30'' B     upper trap stretch      Supine Chin tucks      Standing 3 way hip       Ball on wall chin tucks     TB mid row FM  TB shd ext  TB high row FM 30#  30#  30# 2  2  2 10  10  10     Squat at ballet bar     Standing knee flexion      Lat stretch at ballet bar   Thread the needle at ballet bar  Attempted extension stretch but this increased pain    Cervical AROM flex/ext, rotation, SB      Wall slides       No money ER orange  Wall push ups       Bridge        SLR flex , ABD, ext   each   LAQ     TG squats  TG single squats     Mini squats   Verbal and visual cues for form           Horizontal Abduction orange    Posterior shoulder rolls     scap squeeze                          Therapeutic Activities (97530)       Step ups fwd, lateral  Cues for sequencing                                                    Neuromuscular Re-ed (31517)       CC:  Paloff press     Ball on the wall red   A/P, M/L, CW/CCW   Prone:   Y's, T's, I's  TCs for form   Wall angels  2 X 10    bosu lunges Fwd lat    Wall slides with lift off  2 10                         Manual Intervention (97140)       SOR, cervical pain free PROM, P/A mobs, upper trap stretch        STM B upper trap trigger point release, strain counter strain       Manual cervical traction  7 min     STM to cervical paraspinals and UTs       Scapular mobs                     STM to thoracic paraspinals/rhomboids, Grade 2-3 thoracic PA mobs   6 min         Pt. Education:  -patient educated on diagnosis, prognosis and expectations for rehab  -all patient questions were answered  11/25: Discussed progress made since beginning therapy. Also discussed possible causes of numbness in R shoulder blade and hands.     Home Exercise Program:  10/28: Pt instructed  to continue performing HEP from last episode for now     Therapeutic Exercise and NMR EXR  [x]  (97110) Provided verbal/tactile cueing for activities related to strengthening, flexibility, endurance, ROM for improvements in  [x]  LE / Lumbar: LE, proximal hip, and core control with self care, mobility, lifting, ambulation.  [x]  UE / Cervical: cervical, postural, scapular, scapulothoracic and UE control with self care, reaching, carrying, lifting, house/yardwork, driving, computer work.  []  [phone]) Provided verbal/tactile cueing for activities related to improving balance, coordination, kinesthetic sense, posture, motor skill, proprioception to assist with   []  LE / lumbar: LE, proximal hip, and core control in self care, mobility, lifting, ambulation and eccentric single leg control.   []  UE / cervical: cervical, scapular, scapulothoracic and UE control with self care, reaching, carrying, lifting, house/yardwork, driving, computer work.   []  [phone]) Therapist is in constant attendance of 2 or more patients providing skilled therapy interventions, but not providing any significant amount of measurable one-on-one time to either patient, for improvements in  []  LE / lumbar: LE, proximal hip, and  core control in self care, mobility, lifting, ambulation and eccentric single leg control.   []  UE / cervical: cervical, scapular, scapulothoracic and UE control with self care, reaching, carrying, lifting, house/yardwork, driving, computer work.     NMR and Therapeutic Activities:    [x]  [phone] or 21308) Provided verbal/tactile cueing for activities related to improving balance, coordination, kinesthetic sense, posture, motor skill, proprioception and motor activation to allow for proper function of   []  LE: / Lumbar core, proximal hip and LE with self care and ADLs  [x]  UE / Cervical: cervical, postural, scapular, scapulothoracic and UE control with self care, carrying, lifting, driving, computer work.   []  [phone]) Gait  Re-education- Provided training and instruction to the patient for proper LE, core and proximal hip recruitment and positioning and eccentric body weight control with ambulation re-education including up and down stairs     Home Management Training / Self Care:  []  (69629) Provided self-care/home management training related to activities of daily living and compensatory training, and/or use of adaptive equipment for improvement with: ADLs and compensatory training, meal preparation, safety procedures and instruction in use of adaptive equipment, including bathing, grooming, dressing, personal hygiene, basic household cleaning and chores.     Home Exercise Program:    []  [phone]) Reviewed/Progressed HEP activities related to strengthening, flexibility, endurance, ROM of   []  LE / Lumbar: core, proximal hip and LE for functional self-care, mobility, lifting and ambulation/stair navigation   []  UE / Cervical: cervical, postural, scapular, scapulothoracic and UE control with self care, reaching, carrying, lifting, house/yardwork, driving, computer work  []  (97112)Reviewed/Progressed HEP activities related to improving balance, coordination, kinesthetic sense, posture, motor skill, proprioception of   []  LE: core, proximal hip and LE for self care, mobility, lifting, and ambulation/stair navigation    []  UE / Cervical: cervical, postural,  scapular, scapulothoracic and UE control with self care, reaching, carrying, lifting, house/yardwork, driving, computer work    Manual Treatments:  PROM / STM / Oscillations-Mobs:  G-I, II, III, IV (PA's, Inf., Post.)  [x]  (97140) Provided manual therapy to mobilize LE, proximal hip and/or LS spine soft tissue/joints for the purpose of modulating pain, promoting relaxation,  increasing ROM, reducing/eliminating soft tissue swelling/inflammation/restriction, improving soft tissue extensibility and allowing for proper ROM for normal function with   []  LE / lumbar: self care, mobility,  lifting and ambulation.    [x]  UE / Cervical: self care, reaching, carrying, lifting, house/yardwork, driving, computer work.     Modalities:  []  (32440) Vasopneumatic compression: Utilized vasopneumatic compression to decrease edema / swelling for the purpose of improving mobility and quad tone / recruitment which will allow for increased overall function including but not limited to self-care, transfers, ambulation, and ascending / descending stairs.       Modalities:    12/15: Pt denied heat this date  12/8: Supine on MHP to upper back x 10 min     05/04/19:  Pt was set up on lumbar traction in supine with bolsters under B knees with parameters of 60/45 lbs with on/off time of 30/10, for a total time of 10 minutes. Pt was given panic button as well as instructed how to use it if experiencing pain as well as given bell to call for needs.     05/09/19: Pt was set up on lumbar traction in supine with bolsters under B knees with parameters of 60/45 lbs with on/off time of 30/10, for a total time of 12 minutes. Pt was given  panic button as well as instructed how to use it if experiencing pain as well as given bell to call for needs.     05/11/19 Pt was set up on lumbar traction in supine with bolsters under B knees with parameters of 70/50 lbs with on/off time of 30/10, for a total time of 12 minutes. Pt was given panic button as well as instructed how to use it if experiencing pain as well as given bell to call for needs. CP to low back following traction x 10 min    12/10,  05/14/19: Pt was set up on lumbar traction in supine with bolsters under B knees with parameters of 70/50 lbs with on/off time of 30/10, for a total time of 12 minutes. Pt was given panic button as well as instructed how to use it if experiencing pain as well as given bell to call for needs. CP to low back following traction x 10 min      Charges:  Timed Code Treatment Minutes: 40   Total Treatment Minutes: 40     []  EVAL - LOW (97161)   []  EVAL  - MOD (16109)  []  EVAL - HIGH (60454)  []  RE-EVAL (09811)  [x]  BJ(47829) x  2     []  Ionto  []  NMR (56213) x       []  Vaso  [x]  Manual (97140) x 1      []  Ultrasound  []  TA x        []  Mech Traction (08657)  []  Aquatic Therapy x      []  ES (un) (84696):   []  Home Management Training x  []  ES(attended) (29528)   []  Dry Needling 1-2 muscles (41324):  []  Dry Needling 3+ muscles (401027)  []  Group:      []  Other:     GOALS:  Patient stated goal: "to be completely out of pain"   [] ? Progressing: [] ? Met: [] ? Not Met: [] ? Adjusted    Therapist goals for Patient:   Short Term Goals: To be achieved in: 2 weeks  1. Independent in HEP and progression per patient tolerance, in order to prevent re-injury.   [] ? Progressing: [x] ? Met: [] ? Not Met: [] ? Adjusted  2. Patient will have a decrease in pain to facilitate improvement in movement, function, and ADLs as indicated by Functional Deficits.  [] ? Progressing: [x] ? Met: [] ? Not Met: [] ? Adjusted    Long Term Goals: To be achieved in: 5 weeks  1. Disability index score of 40% or less for the NDI and Oswestry to assist with reaching prior level of function.   [x] ? Progressing: [] ? Met: [] ? Not Met: [] ? Adjusted  2. Patient will demonstrate increased AROM to [facility] of cervical spine to allow for proper joint functioning as indicated by patients Functional Deficits.   [x] ? Progressing: [] ? Met: [] ? Not Met: [] ? Adjusted  3. Patient will demonstrate an increase in LE strength to 4+/5 for greater walking tolerance to improve community and social participation.   [x] ? Progressing: [] ? Met: [] ? Not Met: [] ? Adjusted  4. Patient will return to functional activities including ability to stand to cook a meal without increased symptoms or restriction.   [] ? Progressing: [] ? Met: [] ? Not Met: [] ? Adjusted  5. Patient will report ability to complete ADLs independently 75% of the time.     [] ? Progressing: [x] ? Met: [] ? Not Met: [] ? Adjusted         Overall Progression Towards Functional  goals/ Treatment  Progress Update:  []  Patient is progressing as expected towards functional goals listed.    []  Progression is slowed due to complexities/Impairments listed.  []  Progression has been slowed due to co-morbidities.  [x]  Plan just implemented, too soon to assess goals progression <30days   []  Goals require adjustment due to lack of progress  []  Patient is not progressing as expected and requires additional follow up with physician  []  Other    Persisting Functional Limitations/Impairments:  [x] Sleeping [x] Sitting               [x] Standing [] Transfers        [x] Walking [] Kneeling               [] Stairs [] Squatting / bending   [] ADLs [x] Reaching  [x] Lifting  [] Housework  [] Driving [] Job related tasks  [] Sports/Recreation [] Other:        ASSESSMENT:  Pt reports symptoms do not get worse during the session, but there is also no improvement. Pt may benefit from an injection for pain control.     Treatment/Activity Tolerance:  []  Patient able to complete tx  []  Patient limited by fatigue  [x]  Patient limited by pain  []  Patient limited by other medical complications  []  Other:     Prognosis: []  Good [x]  Fair  []  Poor    Patient Requires Follow-up: [x]  Yes  []  No    Plan for next treatment session:begin with active movement/gentle strengthening as tolerated.     PLAN: See eval. PT 2x / week for 5 weeks.   [x]  Continue per plan of care []  Alter current plan (see comments)  []  Plan of care initiated []  Hold pending MD visit []  Discharge    Electronically signed by: Towanda Octave, PT DPT    Note: If patient does not return for scheduled/ recommended follow up visits, this note will serve as a discharge from care along with most recent update on progress.

## 2019-06-07 ENCOUNTER — Inpatient Hospital Stay: Admit: 2019-06-07 | Payer: MEDICAID

## 2019-06-07 NOTE — Other (Signed)
[facility]  Phone: [phone]   Fax: [phone]    Physical Therapy Daily Treatment Note  Date:  06/07/2019    Patient Name:  Faith Mason    DOB:  Jul 17, 1975  MRN: [id number]  Medical/Treatment Diagnosis Information:  ?? Diagnosis: Neck pain; Bilateral shoulder pain, unspecified chronicity; Pain in both lower extremities  Treatment Diagnosis: Increased cervical and LE pain, decreased cervical and UE ROM, general UE and LE weakness, decreased tolerance for ADLs  Insurance/Certification information:  PT Insurance Information: [employer] - no precert, no deductible  Physician Information:  Referring Practitioner: [name], MD   Plan of care signed (Y/N): '[x]'   Yes '[]'   No     Date of Patient follow up with Physician:      Progress Report: '[]'   Yes  '[x]'   No     Date Range for reporting period:  Beginning: 05/16/19  Ending:     Progress report due (10 Rx/or 30 days whichever is less): visit #16 or 38/18/29     Recertification due (POC duration/ or 90 days whichever is less): visit #16 or 07/19/19      Visit # Insurance Allowable Auth required? Date Range   10/10  + 5/6 30 per year  13 used pre COVID '[]'   Yes  '[x]'   No n/a     Latex Allergy:  '[x]' NO      '[]' YES  Preferred Language for Healthcare:   '[]' English       '[x]' other: Nepali - pt's daughter stated she would be with her at each session and would like to interpret     Functional Scale:        Date assessed:  NDI: raw score = 36; dysfunction = 72%   04/18/19  Oswestry: raw score = 28; dysfunction = 62%  04/18/19    Pain level:  6/10 R Shoulder; 6/10 burning in legs     SUBJECTIVE: Pt denied interpreter phone this date, daughter present. Pt reports that she was unable to sleep last night d/t L leg pain, the pain was shooting/tingling in the leg.  Pain was 10/10, pt completed exercises given to her by PT and now pain is now 6/10.  Pt reports that she feels the pain is coming from a vein.  Pt able to schedule an  appointment to get he injection for mid-January.    Pt feels that there therapy is helping because the pain is less intense overall and she is able to move and complete prolonged activity with less symptoms.     OBJECTIVE:    12/8: Pt's daughter reports that pt is working on her English so her daughter will interpret when needed   11/25: see measurements above     RESTRICTIONS/PRECAUTIONS: Pt very guarded, slow moving     Exercises/Interventions:     Therapeutic Exercises [phone]) Resistance / level Sets/sec Reps Notes   Nustep with UEs 4.0 3 min      UBE     IB  HR  12/1: Done at steps   pulleys       HSS at stairs   2 30'' B     upper trap stretch      Supine Chin tucks      Standing 3 way hip       Ball on wall chin tucks     TB mid row FM  TB shd ext  TB high row FM  Squat at ballet bar  2 10    Standing knee flexion   2 10    Lat stretch at ballet bar   Thread the needle at ballet bar  Attempted extension stretch but this increased pain    Cervical AROM flex/ext, rotation, SB      Wall slides       No money ER orange     Wall push ups       Bridge   x20     SLR flex , ABD, ext   each   LAQ     TG squats  TG single squats     Mini squats   Verbal and visual cues for form           Horizontal Abduction orange    Posterior shoulder rolls     scap squeeze                          Therapeutic Activities (97530)       Step ups fwd, lateral  Cues for sequencing                                                    Neuromuscular Re-ed (60109)       CC:  Paloff press     Ball on the wall red   A/P, M/L, CW/CCW   Prone:  ?? Y's, T's, I's  TCs for form   Wall angels     bosu lunges Fwd lat    Wall slides with lift off  2 10                         Manual Intervention (32355)       SOR, cervical pain free PROM, P/A mobs, upper trap stretch        STM B upper trap trigger point release, strain counter strain       Manual cervical traction       STM to cervical paraspinals and UTs       Scapular mobs       STM to lumbar  paraspinals, glute, hamstring, lateral shin, gastroc  x15 min            STM to thoracic paraspinals/rhomboids, Grade 2-3 thoracic PA mobs            Pt. Education:  -patient educated on diagnosis, prognosis and expectations for rehab  -all patient questions were answered  11/25: Discussed progress made since beginning therapy. Also discussed possible causes of numbness in R shoulder blade and hands.     Home Exercise Program:  10/28: Pt instructed to continue performing HEP from last episode for now     Therapeutic Exercise and NMR EXR  '[x]'  (97110) Provided verbal/tactile cueing for activities related to strengthening, flexibility, endurance, ROM for improvements in  '[x]'  LE / Lumbar: LE, proximal hip, and core control with self care, mobility, lifting, ambulation.  '[x]'  UE / Cervical: cervical, postural, scapular, scapulothoracic and UE control with self care, reaching, carrying, lifting, house/yardwork, driving, computer work.  '[]'  [phone]) Provided verbal/tactile cueing for activities related to improving balance, coordination, kinesthetic sense, posture, motor skill, proprioception to assist with   '[]'  LE / lumbar: LE, proximal hip, and core control in self  care, mobility, lifting, ambulation and eccentric single leg control.   '[]'  UE / cervical: cervical, scapular, scapulothoracic and UE control with self care, reaching, carrying, lifting, house/yardwork, driving, computer work.   '[]'  [phone]) Therapist is in constant attendance of 2 or more patients providing skilled therapy interventions, but not providing any significant amount of measurable one-on-one time to either patient, for improvements in  '[]'  LE / lumbar: LE, proximal hip, and core control in self care, mobility, lifting, ambulation and eccentric single leg control.   '[]'  UE / cervical: cervical, scapular, scapulothoracic and UE control with self care, reaching, carrying, lifting, house/yardwork, driving, computer work.     NMR and Therapeutic Activities:     '[x]'  [phone] or 97530) Provided verbal/tactile cueing for activities related to improving balance, coordination, kinesthetic sense, posture, motor skill, proprioception and motor activation to allow for proper function of   '[]'  LE: / Lumbar core, proximal hip and LE with self care and ADLs  '[x]'  UE / Cervical: cervical, postural, scapular, scapulothoracic and UE control with self care, carrying, lifting, driving, computer work.   '[]'  [phone]) Gait Re-education- Provided training and instruction to the patient for proper LE, core and proximal hip recruitment and positioning and eccentric body weight control with ambulation re-education including up and down stairs     Home Management Training / Self Care:  '[]'  (33825) Provided self-care/home management training related to activities of daily living and compensatory training, and/or use of adaptive equipment for improvement with: ADLs and compensatory training, meal preparation, safety procedures and instruction in use of adaptive equipment, including bathing, grooming, dressing, personal hygiene, basic household cleaning and chores.     Home Exercise Program:    '[]'  [phone]) Reviewed/Progressed HEP activities related to strengthening, flexibility, endurance, ROM of   '[]'  LE / Lumbar: core, proximal hip and LE for functional self-care, mobility, lifting and ambulation/stair navigation   '[]'  UE / Cervical: cervical, postural, scapular, scapulothoracic and UE control with self care, reaching, carrying, lifting, house/yardwork, driving, computer work  '[]'  (97112)Reviewed/Progressed HEP activities related to improving balance, coordination, kinesthetic sense, posture, motor skill, proprioception of   '[]'  LE: core, proximal hip and LE for self care, mobility, lifting, and ambulation/stair navigation    '[]'  UE / Cervical: cervical, postural,  scapular, scapulothoracic and UE control with self care, reaching, carrying, lifting, house/yardwork, driving, computer work    Manual  Treatments:  PROM / STM / Oscillations-Mobs:  G-I, II, III, IV (PA's, Inf., Post.)  '[x]'  (97140) Provided manual therapy to mobilize LE, proximal hip and/or LS spine soft tissue/joints for the purpose of modulating pain, promoting relaxation,  increasing ROM, reducing/eliminating soft tissue swelling/inflammation/restriction, improving soft tissue extensibility and allowing for proper ROM for normal function with   '[]'  LE / lumbar: self care, mobility, lifting and ambulation.    '[x]'  UE / Cervical: self care, reaching, carrying, lifting, house/yardwork, driving, computer work.     Modalities:  '[]'  (67341) Vasopneumatic compression: Utilized vasopneumatic compression to decrease edema / swelling for the purpose of improving mobility and quad tone / recruitment which will allow for increased overall function including but not limited to self-care, transfers, ambulation, and ascending / descending stairs.       Modalities:    12/15: Pt denied heat this date  12/8: Supine on MHP to upper back x 10 min     05/04/19:  Pt was set up on lumbar traction in supine with bolsters under B knees with parameters of 60/45 lbs with on/off  time of 30/10, for a total time of 10 minutes. Pt was given panic button as well as instructed how to use it if experiencing pain as well as given bell to call for needs.     05/09/19: Pt was set up on lumbar traction in supine with bolsters under B knees with parameters of 60/45 lbs with on/off time of 30/10, for a total time of 12 minutes. Pt was given panic button as well as instructed how to use it if experiencing pain as well as given bell to call for needs.     05/11/19 Pt was set up on lumbar traction in supine with bolsters under B knees with parameters of 70/50 lbs with on/off time of 30/10, for a total time of 12 minutes. Pt was given panic button as well as instructed how to use it if experiencing pain as well as given bell to call for needs. CP to low back following traction x 10  min    12/10,  05/14/19: Pt was set up on lumbar traction in supine with bolsters under B knees with parameters of 70/50 lbs with on/off time of 30/10, for a total time of 12 minutes. Pt was given panic button as well as instructed how to use it if experiencing pain as well as given bell to call for needs. CP to low back following traction x 10 min      Charges:  Timed Code Treatment Minutes: 40   Total Treatment Minutes: 40     '[]'  EVAL - LOW (97161)   '[]'  EVAL - MOD (78938)  '[]'  EVAL - HIGH (97163)  '[]'  RE-EVAL (04/15/1974)  '[x]'  ZW(25852) x  2     '[]'  Ionto  '[]'  NMR (77824) x       '[]'  Vaso  '[x]'  Manual (97140) x 1      '[]'  Ultrasound  '[]'  TA x        '[]'  Mech Traction (23536)  '[]'  Aquatic Therapy x      '[]'  ES (un) (97014):   '[]'  Home Management Training x  '[]'  ES(attended) (14431)   '[]'  Dry Needling 1-2 muscles (54008):  '[]'  Dry Needling 3+ muscles (676195)  '[]'  Group:      '[]'  Other:     GOALS:  Patient stated goal: "to be completely out of pain"   '[]' ? Progressing: '[]' ? Met: '[]' ? Not Met: '[]' ? Adjusted  ??  Therapist goals for Patient:   Short Term Goals: To be achieved in: 2 weeks  1. Independent in HEP and progression per patient tolerance, in order to prevent re-injury.   '[]' ? Progressing: '[x]' ? Met: '[]' ? Not Met: '[]' ? Adjusted  2. Patient will have a decrease in pain to facilitate improvement in movement, function, and ADLs as indicated by Functional Deficits.  '[]' ? Progressing: '[x]' ? Met: '[]' ? Not Met: '[]' ? Adjusted  ??  Long Term Goals: To be achieved in: 5 weeks  1. Disability index score of 40% or less for the NDI and Oswestry to assist with reaching prior level of function.   '[x]' ? Progressing: '[]' ? Met: '[]' ? Not Met: '[]' ? Adjusted  2. Patient will demonstrate increased AROM to [facility] of cervical spine to allow for proper joint functioning as indicated by patients Functional Deficits.   '[x]' ? Progressing: '[]' ? Met: '[]' ? Not Met: '[]' ? Adjusted  3. Patient will demonstrate an increase in LE strength to 4+/5 for greater walking tolerance to improve  community and social participation.   '[x]' ? Progressing: '[]' ? Met: '[]' ? Not Met: '[]' ?  Adjusted  4. Patient will return to functional activities including ability to stand to cook a meal without increased symptoms or restriction.   '[]' ? Progressing: '[]' ? Met: '[]' ? Not Met: '[]' ? Adjusted  5. Patient will report ability to complete ADLs independently 75% of the time.     '[]' ? Progressing: '[x]' ? Met: '[]' ? Not Met: '[]' ? Adjusted         Overall Progression Towards Functional goals/ Treatment Progress Update:  '[]'  Patient is progressing as expected towards functional goals listed.    '[]'  Progression is slowed due to complexities/Impairments listed.  '[]'  Progression has been slowed due to co-morbidities.  '[x]'  Plan just implemented, too soon to assess goals progression <30days   '[]'  Goals require adjustment due to lack of progress  '[]'  Patient is not progressing as expected and requires additional follow up with physician  '[]'  Other    Persisting Functional Limitations/Impairments:  '[x]' Sleeping '[x]' Sitting               '[x]' Standing '[]' Transfers        '[x]' Walking '[]' Kneeling               '[]' Stairs '[]' Squatting / bending   '[]' ADLs '[x]' Reaching  '[x]' Lifting  '[]' Housework  '[]' Driving '[]' Job related tasks  '[]' Sports/Recreation '[]' Other:        ASSESSMENT:  Pt's symptoms fluctuate frequently.  Today pt's symptoms are radicular in nature into the L leg and foot.  Completed there-ex which pt reports slightly worsened symptoms and trialed STM to the region of her pain which pt reports helped minimally.  Overall pt feels therapy is helping.  Will perform PN NV and determine if further PT would be beneficial.  Pt's insurance will be out NV so if wanting to continue will have to schedule sessions for next calendar year.  Pt scheduled to have an injection in mid-January.     Treatment/Activity Tolerance:  '[]'  Patient able to complete tx  '[]'  Patient limited by fatigue  '[x]'  Patient limited by pain  '[]'  Patient limited by other medical complications  '[]'  Other:      Prognosis: '[]'  Good '[x]'  Fair  '[]'  Poor    Patient Requires Follow-up: '[x]'  Yes  '[]'  No    Plan for next treatment session:begin with active movement/gentle strengthening as tolerated.     PLAN: See eval. PT 2x / week for 5 weeks.   '[x]'  Continue per plan of care '[]'  Alter current plan (see comments)  '[]'  Plan of care initiated '[]'  Hold pending MD visit '[]'  Discharge    Electronically signed by: August Albino, PT DPT    Note: If patient does not return for scheduled/ recommended follow up visits, this note will serve as a discharge from care along with most recent update on progress.

## 2019-06-20 ENCOUNTER — Inpatient Hospital Stay: Admit: 2019-06-20 | Payer: MEDICAID

## 2019-06-20 NOTE — Other (Signed)
[facility]  Phone: [phone]   Fax: [phone]     Physical Therapy Re-Certification Plan of Care    Dear Faith Mo, MD ,    We had the pleasure of treating the following patient for physical therapy services at [facility]. A summary of our findings can be found in the updated assessment below.  This includes our plan of care.  If you have any questions or concerns regarding these findings, please do not hesitate to contact me at the office phone number checked above.  Thank you for the referral.     Physician Signature:________________________________Date:__________________  By signing above (or electronic signature), therapist???s plan is approved by physician      Functional Outcome: na, declined due to difficulty translating    MMT L hip abd 3+/5, ext 4-/5  Cervical AROM WFL into each direction    Overall Response to Treatment:   _0 Patient is responding well to treatment and improvement is noted with regards  to goals   _1 Patient should continue to improve in reasonable time if they continue HEP   _2 Patient has plateaued and is no longer responding to skilled PT intervention    _3 Patient is getting worse and would benefit from return to referring MD   _4 Patient unable to adhere to initial POC   _5 Other:   Patient is slowly improving during therapy, demonstrating improvements in functionality and activity tolerance, however pain levels persist and can get out of control, significantly limiting function.  Pt is scheduled to receive injection to help with pain in a few weeks and would like to return to PT afterwards so that she can assess her symptoms and obtain MD's opinion.  Pt's dtr plans to perform STM to pain sites until next follow-up.  Request additional PT visits to further improve BLE strength and functionality to return pt to pain-free PLOF.    Date range of Visits: 10/28 to  06/20/2019  Total Visits: 16    Recommendation:    _6 Request additional PT visits at 2x / wk for 6 weeks.    _7 Hold PT, pending MD visit        Physical Therapy Daily Treatment Note  Date:  06/20/2019    Patient Name:  Faith Mason    DOB:  [birth year]  MRN: [id number]  Medical/Treatment Diagnosis Information:  ?? Diagnosis: Neck pain; Bilateral shoulder pain, unspecified chronicity; Pain in both lower extremities  Treatment Diagnosis: Increased cervical and LE pain, decreased cervical and UE ROM, general UE and LE weakness, decreased tolerance for ADLs  Insurance/Certification information:  PT Insurance Information: [employer] - no precert, no deductible  Physician Information:  Referring Practitioner: Faith Mo, MD   Plan of care signed (Y/N): _8   Yes _9   No     Date of Patient follow up with Physician:      Progress Report: _10   Yes  _11   No     Date Range for reporting period:  Beginning: 05/16/19  Ending:  06/20/2019    Progress report due (10 Rx/or 30 days whichever is less): visit #16 or 69/48/54     Recertification due (POC duration/ or 90 days whichever is less): visit #16 or 07/19/19      Visit # Insurance Allowable Auth required? Date Range   10/10  + 6/6 30 per year  13 used pre COVID _12   Yes  _13   No n/a  Latex Allergy:  _0 NO      _1 YES  Preferred Language for Healthcare:   _2 English       _3 other: Nepali - pt's daughter stated she would be with her at each session and would like to interpret     Functional Scale:        Date assessed:  NDI: raw score = 36; dysfunction = 72%   04/18/19  Oswestry: raw score = 28; dysfunction = 62%  04/18/19    Pain level:  4-5/10 R Shoulder; 3-4/10 lateral LLE    SUBJECTIVE:     Per dtr interpreting, Pt liked the STM with rolling last session, states she was pain-free for a couple days before pain gradually returned.  Dtr states she is willing to learn manual technique and obtain massage tool so she can perform at home w/pt.  Pt continues to complain of same pain but  of lesser intensity and feels that additional PT is necessary to further reduce and alleviate pain levels, however would like to see her reaction to the injection Jan 19th and return afterwards, if agreed by MD.      OBJECTIVE:    12/30:  MMT L hip abd 3+/5, ext 4-/5    12/8: Pt's daughter reports that pt is working on her English so her daughter will interpret when needed   11/25: see measurements above     RESTRICTIONS/PRECAUTIONS: Pt very guarded, slow moving     Exercises/Interventions:     Therapeutic Exercises (97110) Resistance / level Sets/sec Reps Notes   Nustep with UEs 1.0 5 min      UBE     IB  HR  2  2 30''  10    pulleys       HSS at stairs   2 30'' B     upper trap stretch      Supine Chin tucks      Standing 3 way hip       Ball on wall chin tucks     TB mid row FM  TB shd ext  TB high row FM    Squat at ballet bar  2 10    Standing knee flexion   2 10    Lat stretch at ballet bar   Thread the needle at ballet bar  Attempted extension stretch but this increased pain    Cervical AROM flex/ext, rotation, SB      Wall slides       No money ER orange     Wall push ups       Bridge   x20     SLR flex , ABD, ext   each   LAQ     TG squats  TG single squats     Mini squats   Verbal and visual cues for form           Horizontal Abduction orange    Posterior shoulder rolls     scap squeeze                          Therapeutic Activities (97530)       Step ups fwd, lateral  Cues for sequencing  Neuromuscular Re-ed [phone])       CC:  Paloff press     Ball on the wall red   A/P, M/L, CW/CCW   Prone:  ?? Y's, T's, I's  TCs for form   Wall angels     bosu lunges Fwd lat    Wall slides with lift off  2 10                         Manual Intervention (44461)       SOR, cervical pain free PROM, P/A mobs, upper trap stretch        STM B upper trap trigger point release, strain counter strain       Manual cervical traction       STM to cervical paraspinals and UTs        Scapular mobs       STM to L TFL, IT-band, greater trochanter, L lumbar paraspinals, B glute, L gastroc & hamstring, ;   R upper trap, levator scapula  x25 min  -w/The Stick and blue massage tool;  Dtr instructed on and demo'd correct technique in order to perform at home          STM to thoracic paraspinals/rhomboids, Grade 2-3 thoracic PA mobs            Pt. Education:  -patient educated on diagnosis, prognosis and expectations for rehab  -all patient questions were answered  11/25: Discussed progress made since beginning therapy. Also discussed possible causes of numbness in R shoulder blade and hands.     Home Exercise Program:  10/28: Pt instructed to continue performing HEP from last episode for now     Therapeutic Exercise and NMR EXR  _0  (90122) Provided verbal/tactile cueing for activities related to strengthening, flexibility, endurance, ROM for improvements in  _1  LE / Lumbar: LE, proximal hip, and core control with self care, mobility, lifting, ambulation.  _2  UE / Cervical: cervical, postural, scapular, scapulothoracic and UE control with self care, reaching, carrying, lifting, house/yardwork, driving, computer work.  _3  [phone]) Provided verbal/tactile cueing for activities related to improving balance, coordination, kinesthetic sense, posture, motor skill, proprioception to assist with   _4  LE / lumbar: LE, proximal hip, and core control in self care, mobility, lifting, ambulation and eccentric single leg control.   _5  UE / cervical: cervical, scapular, scapulothoracic and UE control with self care, reaching, carrying, lifting, house/yardwork, driving, computer work.   _6  [phone]) Therapist is in constant attendance of 2 or more patients providing skilled therapy interventions, but not providing any significant amount of measurable one-on-one time to either patient, for improvements in  _7  LE / lumbar: LE, proximal hip, and core control in self care, mobility, lifting, ambulation and eccentric  single leg control.   _8  UE / cervical: cervical, scapular, scapulothoracic and UE control with self care, reaching, carrying, lifting, house/yardwork, driving, computer work.     NMR and Therapeutic Activities:    _9  (27670 or 11003) Provided verbal/tactile cueing for activities related to improving balance, coordination, kinesthetic sense, posture, motor skill, proprioception and motor activation to allow for proper function of   _10  LE: / Lumbar core, proximal hip and LE with self care and ADLs  _11  UE / Cervical: cervical, postural, scapular, scapulothoracic and UE control with self care, carrying, lifting, driving, computer work.   _12  (49611) Gait Re-education- Provided training and instruction to the patient for proper LE, core and proximal hip  recruitment and positioning and eccentric body weight control with ambulation re-education including up and down stairs     Home Management Training / Self Care:  _0  (00459) Provided self-care/home management training related to activities of daily living and compensatory training, and/or use of adaptive equipment for improvement with: ADLs and compensatory training, meal preparation, safety procedures and instruction in use of adaptive equipment, including bathing, grooming, dressing, personal hygiene, basic household cleaning and chores.     Home Exercise Program:    _1  (97741) Reviewed/Progressed HEP activities related to strengthening, flexibility, endurance, ROM of   _2  LE / Lumbar: core, proximal hip and LE for functional self-care, mobility, lifting and ambulation/stair navigation   _3  UE / Cervical: cervical, postural, scapular, scapulothoracic and UE control with self care, reaching, carrying, lifting, house/yardwork, driving, computer work  _4  (97112)Reviewed/Progressed HEP activities related to improving balance, coordination, kinesthetic sense, posture, motor skill, proprioception of   _5  LE: core, proximal hip and LE for self care, mobility, lifting, and  ambulation/stair navigation    _6  UE / Cervical: cervical, postural,  scapular, scapulothoracic and UE control with self care, reaching, carrying, lifting, house/yardwork, driving, computer work    Manual Treatments:  PROM / STM / Oscillations-Mobs:  G-I, II, III, IV (PA's, Inf., Post.)  _7  (42395) Provided manual therapy to mobilize LE, proximal hip and/or LS spine soft tissue/joints for the purpose of modulating pain, promoting relaxation,  increasing ROM, reducing/eliminating soft tissue swelling/inflammation/restriction, improving soft tissue extensibility and allowing for proper ROM for normal function with   _8  LE / lumbar: self care, mobility, lifting and ambulation.    _9  UE / Cervical: self care, reaching, carrying, lifting, house/yardwork, driving, computer work.     Modalities:  _10  (32023) Vasopneumatic compression: Utilized vasopneumatic compression to decrease edema / swelling for the purpose of improving mobility and quad tone / recruitment which will allow for increased overall function including but not limited to self-care, transfers, ambulation, and ascending / descending stairs.       Modalities:    12/15: Pt denied heat this date  12/8: Supine on MHP to upper back x 10 min     05/04/19:  Pt was set up on lumbar traction in supine with bolsters under B knees with parameters of 60/45 lbs with on/off time of 30/10, for a total time of 10 minutes. Pt was given panic button as well as instructed how to use it if experiencing pain as well as given bell to call for needs.     05/09/19: Pt was set up on lumbar traction in supine with bolsters under B knees with parameters of 60/45 lbs with on/off time of 30/10, for a total time of 12 minutes. Pt was given panic button as well as instructed how to use it if experiencing pain as well as given bell to call for needs.     05/11/19 Pt was set up on lumbar traction in supine with bolsters under B knees with parameters of 70/50 lbs with on/off time of 30/10,  for a total time of 12 minutes. Pt was given panic button as well as instructed how to use it if experiencing pain as well as given bell to call for needs. CP to low back following traction x 10 min    12/10,  05/14/19: Pt was set up on lumbar traction in supine with bolsters under B knees with parameters of 70/50 lbs with on/off time of 30/10, for a total time of 12 minutes. Pt was  given panic button as well as instructed how to use it if experiencing pain as well as given bell to call for needs. CP to low back following traction x 10 min      Charges:  Timed Code Treatment Minutes: 42   Total Treatment Minutes: 42     _0  EVAL - LOW (97161)   _1  EVAL - MOD (28768)  _2  EVAL - HIGH (97163)  _3  RE-EVAL (11572)  _4  IO(03559) x       _5  Ionto  _6  NMR (74163) x       _7  Vaso  _8  Manual (97140) x 2      _9  Ultrasound  _10  TA x  1      _11  Mech Traction (84536)  _12  Aquatic Therapy x      _13  ES (un) (46803):   _14  Home Management Training x  _15  ES(attended) (21224)   _16  Dry Needling 1-2 muscles (82500):  _17  Dry Needling 3+ muscles (370488)  _18  Group:      _19  Other:     GOALS:  Patient stated goal: "to be completely out of pain"   _20 ? Progressing: _21 ? Met: _22 ? Not Met: _23 ? Adjusted  ??  Therapist goals for Patient:   Short Term Goals: To be achieved in: 2 weeks  1. Independent in HEP and progression per patient tolerance, in order to prevent re-injury.   _24 ? Progressing: _25 ? Met: _26 ? Not Met: _27 ? Adjusted  2. Patient will have a decrease in pain to facilitate improvement in movement, function, and ADLs as indicated by Functional Deficits.  _28 ? Progressing: _29 ? Met: _30 ? Not Met: _31 ? Adjusted  ??  Long Term Goals: To be achieved in: 5 weeks  1. Disability index score of 40% or less for the NDI and Oswestry to assist with reaching prior level of function.  -pt declined completing due to incr difficulty translating forms  _32 ? Progressing: _33 ? Met: _34 ? Not Met: _35 ? Adjusted  2. Patient will demonstrate increased AROM to [facility] of  cervical spine to allow for proper joint functioning as indicated by patients Functional Deficits.   _36 ? Progressing: _37 ? Met: _38 ? Not Met: _39 ? Adjusted  3. Patient will demonstrate an increase in LE strength to 4+/5 for greater walking tolerance to improve community and social participation.   _40 ? Progressing: _41 ? Met: _42 ? Not Met: _43 ? Adjusted  4. Patient will return to functional activities including ability to stand to cook a meal without increased symptoms or restriction.   _44 ? Progressing: _45 ? Met: _46 ? Not Met: _47 ? Adjusted  5. Patient will report ability to complete ADLs independently 75% of the time.     _48 ? Progressing: _49 ? Met: _50 ? Not Met: _51 ? Adjusted         Overall Progression Towards Functional goals/ Treatment Progress Update:  _52  Patient is progressing as expected towards functional goals listed.    _53  Progression is slowed due to complexities/Impairments listed.  _54  Progression has been slowed due to co-morbidities.  _55  Plan just implemented, too soon to assess goals progression <30days   _56  Goals require adjustment due to lack of progress  _57  Patient is not progressing as expected and requires additional follow up with physician  _58  Other    Persisting Functional Limitations/Impairments:  _59 Sleeping _60 Sitting               _61 Standing _62 Transfers        _63 Walking _64 Kneeling               _65 Stairs _66   Squatting / bending   _0 ADLs _1 Reaching  _2 Lifting  _3 Housework  _4 Driving <JFHLKTGYBWLSLHTD>_4<\/KAJGOTLXBWIOMBTD>_9 Job related tasks  _6 Sports/Recreation _7 Other:        ASSESSMENT:    Patient is slowly improving during therapy, demonstrating improvements in functionality and activity tolerance, however pain levels persist and can get out of control, significantly limiting function.  Pt is scheduled to receive injection to help with pain in a few weeks and would like to return to PT afterwards so that she can assess her symptoms and obtain MD's opinion.  Pt's dtr plans to perform STM to pain sites until next follow-up.  Request  additional PT visits to further improve BLE strength and functionality to return pt to pain-free PLOF.    Treatment/Activity Tolerance:  _8  Patient able to complete tx  _9  Patient limited by fatigue  _10  Patient limited by pain  _11  Patient limited by other medical complications  <RCBULAGTXMIWOEHO>_1<\/YYQMGNOIBBCWUGQB>_16  Other:     Prognosis: _13  Good _14  Fair  _15  Poor    Patient Requires Follow-up: _16  Yes  _17  No    Plan for next treatment session:begin with active movement/gentle strengthening as tolerated.     PLAN: See eval. PT 2x / week for 5 weeks.   _18  Continue per plan of care _19  Alter current plan (see comments)  _20  Plan of care initiated _21  Hold pending MD visit _22  Discharge    Electronically signed by:   Gunnar Fusi, PT      Note: If patient does not return for scheduled/ recommended follow up visits, this note will serve as a discharge from care along with most recent update on progress.

## 2019-07-24 ENCOUNTER — Ambulatory Visit: Payer: MEDICAID

## 2019-07-31 ENCOUNTER — Ambulatory Visit: Payer: MEDICAID

## 2019-08-02 NOTE — Telephone Encounter (Signed)
General Question     Subject: She said someone was supposed to call her about getting a injection    Patient and /or Facility Request: pt    Contact Number: [phone]

## 2019-08-02 NOTE — Telephone Encounter (Signed)
Called and gave Dr. Jerl Santos number again.

## 2019-08-13 NOTE — Progress Notes (Signed)
SCHEDULED A FEMALE NEPALI  INTERPRETER TO BE AT [facility] ON 08/15/19 AT 1300 UNTIL 1430.  AFFORDABLE LANGUAGES [phone] JOB# [id number]

## 2019-08-13 NOTE — Progress Notes (Addendum)
PATIENT REACHED   YES____NO____    PREOP INSTRUCTIONS LEFT ON VM CONTACT # OF PT'S DAUGHTER [phone]______________      DATE__2/24/21_______ TIME__1400_______ARRIVAL_1300_______PLACE__[facility]__________  NOTHING TO EAT OR DRINK  AFTER MIDNIGHT THE EVENING PRIOR OR AS INSTRUCTED BY YOUR DR.  Bonita Quin NEED A RESPONSIBLE ADULT AGE 46 OR OLDER TO DRIVE YOU HOME  PLEASE BRING INSURANCE CARD.PICTURE ID AND COMPLETE LIST OF MEDS  WEAR LOOSE COMFORTABLE CLOTHING  FOLLOW ANY INSTRUCTIONS YOUR DRS OFFICE HAS GIVEN YOU,INCLUDING WHAT MEDICATIONS TO TAKE THE AM OF PROCEDURE AND WHEN AND IF YOU NEED TO STOP ANY BLOOD THINNERS. IF YOU HAVE QUESTIONS REGARDING THIS CALL THE OFFICE  THE GOAL BLOOD SUGAR THE AM OF PROCEDURE IS 200 OR LESS ABOVE THAT THE PROCEDURE MAY BE CANCELLED  ANY QUESTIONS CALL YOUR DOCTOR.ALSO,PLEASE READ THE INSTRUCTION PACKET FROM YOUR DR IF YOU RECEIVED ONE.  SPINE INTERVENTION NUMBER IS [phone]    WE WILL SCHEDULE AN INTERPRETER TO BE PRESENT AT THE HOSPITAL DURING YOUR STAY.    VISITOR POLICY(subject to change)      There is a one visitor policy at [facility] for all surgeries and endoscopies.Whether the visitor can stay or will be asked to wait in the car will depend on the current policy and if social distancing can be maintained.The policy is subject to change at any time.Please make sure the visitor has a cell phone that is on,charged and able to accept calls, as this may be the way that the staff communicates with them.Pain management is NO VISITOR policyThe patients ride is expected to remain in the car with a cell phone for communication.If the ride is leaving the hospital grounds please make sure they are back in time for pickup. Have the patient inform the staff on arrival what their rides plans are while the patient is in the facility.At the MAIN there is one visitor allowed.Please note that the visitor policy is subject to change.

## 2019-08-15 ENCOUNTER — Inpatient Hospital Stay: Payer: MEDICAID

## 2019-08-15 ENCOUNTER — Ambulatory Visit: Admit: 2019-08-15 | Payer: MEDICAID

## 2019-08-15 LAB — POCT GLUCOSE: POC Glucose: 107 mg/dl — ABNORMAL HIGH (ref 70–99)

## 2019-08-15 LAB — PREGNANCY, URINE: HCG(Urine) Pregnancy Test: NEGATIVE

## 2019-08-15 MED ORDER — LIDOCAINE HCL (PF) 1 % IJ SOLN
1 % | Freq: Once | INTRAMUSCULAR | Status: AC | PRN
Start: 2019-08-15 — End: 2019-08-15
  Administered 2019-08-15: 19:00:00 3 via INTRADERMAL

## 2019-08-15 MED ORDER — MIDAZOLAM HCL 2 MG/2ML IJ SOLN
2 | INTRAMUSCULAR | Status: AC
Start: 2019-08-15 — End: 2019-08-15

## 2019-08-15 MED ORDER — MIDAZOLAM HCL 2 MG/2ML IJ SOLN
2 MG/ML | Freq: Once | INTRAMUSCULAR | Status: AC | PRN
Start: 2019-08-15 — End: 2019-08-15
  Administered 2019-08-15: 19:00:00 1 via INTRAVENOUS

## 2019-08-15 MED ORDER — FENTANYL CITRATE (PF) 100 MCG/2ML IJ SOLN
100 MCG/2ML | Freq: Once | INTRAMUSCULAR | Status: AC | PRN
Start: 2019-08-15 — End: 2019-08-15
  Administered 2019-08-15: 19:00:00 50 via INTRAVENOUS

## 2019-08-15 MED ORDER — FENTANYL CITRATE (PF) 100 MCG/2ML IJ SOLN
100 | INTRAMUSCULAR | Status: AC
Start: 2019-08-15 — End: 2019-08-15

## 2019-08-15 MED ORDER — SODIUM CHLORIDE (PF) 0.9 % IJ SOLN
0.9 % | Freq: Once | INTRAMUSCULAR | Status: AC | PRN
Start: 2019-08-15 — End: 2019-08-15
  Administered 2019-08-15: 19:00:00 2

## 2019-08-15 MED ORDER — DEXAMETHASONE SOD PHOSPHATE PF 10 MG/ML IJ SOLN
10 MG/ML | Freq: Once | INTRAMUSCULAR | Status: AC | PRN
Start: 2019-08-15 — End: 2019-08-15
  Administered 2019-08-15: 19:00:00 10

## 2019-08-15 MED FILL — MIDAZOLAM HCL 2 MG/2ML IJ SOLN: 2 mg/mL | INTRAMUSCULAR | Qty: 2

## 2019-08-15 MED FILL — FENTANYL CITRATE (PF) 100 MCG/2ML IJ SOLN: 100 MCG/2ML | INTRAMUSCULAR | Qty: 2

## 2019-08-15 NOTE — Progress Notes (Addendum)
Dressed and discharged to home with daughter.  Has instructions, purse, jacket and all belongings.  Fluids given to go.

## 2019-08-15 NOTE — Discharge Instructions (Signed)
DR. STAGGS DISCHARGE INSTRUCTIONS           1. Do not drive today for ( 8 hours with no sedation)  (24 hours if had sedation)     2. If you received sedation during your procedure, be advised for the next 24 hour       Do not drink alcohol    Do not operate machinery    Do not make any important decisions or sign any legal documents    You may experience light headedness,dizziness or sleepiness     3. You may apply ice for 15 minutes 4-5 times a day as needed.     4. No heating pad for 48 hours     5.Follow up with Dr. Staggs as scheduled     6.Keep site dry for 12 hours after procedure, then remove bandaid.     7.It may take 24-48 hours to feel improvement.     8. Side effects of Steroids may include:      Elevated glucose levels ( in diabetics)    Fluid retention    Tenderness at site    Nervous energy    Sleeplessness    Muscle spasms    Facial flushing    Hot flashes     9. Call if:    Your symptoms worsen severely    You experience a severe headache that worsens when upright    You develop a fever greater than 101 degrees     10. Call [phone] for follow up appointments, questions or problems                             11. You may restart any blood thinning medications tomorrow.

## 2019-08-15 NOTE — Progress Notes (Signed)
IV discontinued, catheter intact, and dressing applied.    Procedural dressing dry and intact.    Bilateral upper extremities equal in strength.    Discharge instructions reviewed with patient or responsible adult, signed and copy given.  All home medications have been reviewed.  All questions answered and patient or responsible adult verbalized understanding.

## 2019-08-15 NOTE — Progress Notes (Signed)
Received in [facility] Phase 2 Recovery from Procedure Room.  Respirations easy on room air. VSS.  Denies any discomfort.

## 2019-08-15 NOTE — Progress Notes (Signed)
Teaching / education initiated regarding perioperative experience, expectations, and pain management during stay. Patient verbalized understanding.

## 2019-08-15 NOTE — Op Note (Signed)
PATIENT:  Faith Mason, Faith Mason  AGE:  44 yrs  MEDICAL RECORD #:  [id number]  DATE OF BIRTH:  1975/09/13     DATE:  08/15/2019  PHYSICIAN: Bethanne Ginger, M.D.     PROCEDURE: C7-T1 interlaminar epidural steroid injection under fluoroscopy.     PRE-OP DIAGNOSIS:  Neck Pain/Radiculopathy     POST-OP DIAGNOSIS:  same     HISTORY OF PRESENT ILLNESS:  See office notes. Patient has failed previous less-invasive treatments.    Pre-op pain score: 6    Post-op pain score: 0     ALLERGIES:  Patient has no known allergies.     MEDICATIONS:    No current facility-administered medications for this encounter.         PHYSICAL EXAMINATION:              General:  Awake, alert              Heart:  No audible murmurs, extremities well perfused              Lungs:  No increased WOB or audible wheezing              Extremities:  Normal tone. Warm. No swelling.      Anesthesia: 1 mg Versed and 50 mcg fentanyl    Estimated blood loss: None    DESCRIPTION OF PROCEDURE:     Components of the procedure were again reviewed with the patient prior to the procedure.  She is aware of risks including infection, bleeding, allergic reaction, and nerve injury.  She had ample opportunity for additional questions.  She elected to proceed with treatment.     The patient was placed in the prone position.  Cardiovascular monitoring was initiated, and vital signs were stable prior to, during, and after the procedure.  Utilizing fluoroscopy, the C7-T1 vertebrae were identified.  The area was sterilely prepped and draped. Skin anesthesia was achieved at the entry site using 1-2 cc of Lidocaine 1%. A 22 g 3.50 inch Touhy spinal needle was slowly inserted into the C7-T1 interlaminar epidural space using AP, lateral and oblique fluoroscopic imaging and loss of resistance technique. Negative aspiration was confirmed. 1 cc Isovue-M 300 was injected showing contrast spread into the epidural space.  A combination of 2 cc normal saline and 10 mg dexamethasone were slowly  injected. The needle was removed after the stylet was repositioned. A sterile bandage was applied.  The patient was brought to recovery in stable condition.The patient tolerated the procedure well.     DISPOSITION:  The patient was transported to recovery.  The patient was monitored for 15 to 20 minutes post-procedure.  Precautions were discussed and written instructions provided.    Comment: Great epidural flow

## 2019-08-15 NOTE — H&P (Signed)
HISTORY AND PHYSICAL/PRE-SEDATION ASSESSMENT    Patient:  Faith Mason, Faith Mason   DOB:  03-20-1976  Medical Record No.:  [id number]   Date:  08/15/2019  Physician:  Sheryle Spray, MD  Facility: [facility]                 The patient is a 44 y.o. female whom presents with arm pain. Review of the imaging and physical exam of the patient confirmed the pre-procedure diagnosis.  After a thorough discussion of risks, benefits and alternatives informed consent was obtained.    Diagnosis:  M54.12  CERVICAL RADICULOPATHY    Past Medical History:   Past Medical History:   Diagnosis Date   ??? GERD (gastroesophageal reflux disease)    ??? Hypertension 2016        Past Surgical History:     Past Surgical History:   Procedure Laterality Date   ??? BACK SURGERY  2017    L-spine       Current Medications:   Prior to Admission medications    Medication Sig Start Date End Date Taking? Authorizing Provider   lisinopril-hydroCHLOROthiazide (PRINZIDE;ZESTORETIC) 10-12.5 MG per tablet Take 1 tablet by mouth daily 04/05/19  Yes Pat Kocher Davit, MD   blood glucose monitor strips Test 2times a day 06/17/18   [name], MD   glucose monitoring kit (FREESTYLE) monitoring kit 1 each by Does not apply route once for 1 dose Glucometer:QD-BID testing. Ok to provide insurance covered device. 06/17/18 02/16/19  [name], MD   Lancets MISC BID testing 06/16/18   [name], MD       Allergies:  Patient has no known allergies.    Social History:    reports that she has never smoked. She has never used smokeless tobacco. She reports that she does not drink alcohol or use drugs.    Family History:   Family History   Problem Relation Age of Onset   ??? High Blood Pressure Mother    ??? Diabetes Mother    ??? High Blood Pressure Father    ??? Diabetes Father         Vitals: Blood pressure (!) 141/90, pulse 62, temperature 98 ??F (36.7 ??C), resp. rate 16, height '4\' 11"'  (1.499 m), weight 140 lb (63.5 kg),  last menstrual period 02/12/2019, SpO2 100 %.    PHYSICAL EXAM:  HENT: Airway patent and reviewed  Cardiovascular: Normal rate, regular rhythm, normal heart sounds.   Pulmonary/Chest: No wheezes. No rhonchi. No rales.   Abdominal: Soft. Bowel sounds are normal. No distension.  Extremities: Moves all extremities equally  Lumbar Spine: Painful range of motion, no midline tenderness     ASA CLASS:         '[]'    I. Normal, healthy adult           '[x]'    II.  Mild systemic disease            '[]'    III.  Severe systemic disease    Mallampati: Mallampati Class II - (soft palate, fauces & uvula are visible)      Sedation plan:   '[]'   Local              '[x]'   Minimal                  '[]'   General anesthesia    Treatment plan:  Patient's condition acceptable for  planned procedure/sedation.  Proceed with planned procedure   Post Procedure Plan   Return to same level of care   ______________________     The risks and benefits as well as alternatives to the procedure have been discussed with the patient and or family.  The patient and/or next of kin understands and agrees to proceed.    Sheryle Spray, MD

## 2019-08-28 ENCOUNTER — Inpatient Hospital Stay: Admit: 2019-08-28 | Payer: MEDICAID

## 2019-08-28 ENCOUNTER — Encounter

## 2019-08-28 DIAGNOSIS — Z1231 Encounter for screening mammogram for malignant neoplasm of breast: Secondary | ICD-10-CM

## 2019-08-30 ENCOUNTER — Encounter

## 2019-08-30 MED ORDER — LISINOPRIL-HYDROCHLOROTHIAZIDE 10-12.5 MG PO TABS
ORAL_TABLET | Freq: Every day | ORAL | 0 refills | Status: DC
Start: 2019-08-30 — End: 2019-10-03

## 2019-08-30 MED ORDER — LANCETS MISC
6 refills | Status: AC
Start: 2019-08-30 — End: ?

## 2019-08-30 NOTE — Telephone Encounter (Signed)
Pt's daughter called to request for refill on lancets and bp med. Orders pended.  Also, would like for blood work orders to be placed and they will set up a virtual visit after the blood work

## 2019-08-30 NOTE — Telephone Encounter (Signed)
Orders placed/med e-scribed.

## 2019-09-21 ENCOUNTER — Encounter

## 2019-09-22 LAB — HEMOGLOBIN A1C
Hemoglobin A1C: 6.5 %
eAG: 139.9 mg/dL

## 2019-09-22 LAB — COMPREHENSIVE METABOLIC PANEL
ALT: 13 U/L (ref 10–40)
AST: 16 U/L (ref 15–37)
Albumin/Globulin Ratio: 1.3 (ref 1.1–2.2)
Albumin: 4.3 g/dL (ref 3.4–5.0)
Alkaline Phosphatase: 109 U/L (ref 40–129)
Anion Gap: 11 (ref 3–16)
BUN: 9 mg/dL (ref 7–20)
CO2: 27 mmol/L (ref 21–32)
Calcium: 9.2 mg/dL (ref 8.3–10.6)
Chloride: 103 mmol/L (ref 99–110)
Creatinine: 0.6 mg/dL (ref 0.6–1.1)
GFR African American: >60 (ref >60)
GFR Non-African American: >60 (ref >60)
Globulin: 3.3 g/dL
Glucose: 101 mg/dL — ABNORMAL HIGH (ref 70–99)
Potassium: 4.5 mmol/L (ref 3.5–5.1)
Sodium: 141 mmol/L (ref 136–145)
Total Bilirubin: 0.4 mg/dL (ref 0.0–1.0)
Total Protein: 7.6 g/dL (ref 6.4–8.2)

## 2019-09-22 LAB — LIPID PANEL
Cholesterol, Total: 211 mg/dL — ABNORMAL HIGH (ref 0–199)
HDL: 41 mg/dL (ref 40–60)
LDL Calculated: 128 mg/dL — ABNORMAL HIGH (ref <100)
Triglycerides: 212 mg/dL — ABNORMAL HIGH (ref 0–150)
VLDL Cholesterol Calculated: 42 mg/dL

## 2019-09-22 LAB — MICROALBUMIN / CREATININE URINE RATIO
Creatinine, Ur: 74.9 mg/dL (ref 28.0–259.0)
Microalbumin, Random Urine: <1.2 mg/dL (ref <2.0)

## 2019-09-27 MED ORDER — BLOOD GLUCOSE TEST VI STRP
ORAL_STRIP | 0 refills | Status: AC
Start: 2019-09-27 — End: ?

## 2019-10-03 ENCOUNTER — Telehealth: Admit: 2019-10-03 | Discharge: 2019-10-03 | Payer: MEDICAID | Attending: Family Medicine

## 2019-10-03 DIAGNOSIS — E119 Type 2 diabetes mellitus without complications: Secondary | ICD-10-CM

## 2019-10-03 MED ORDER — BLOOD GLUCOSE TEST VI STRP
ORAL_STRIP | 3 refills | Status: AC
Start: 2019-10-03 — End: ?

## 2019-10-03 MED ORDER — LISINOPRIL-HYDROCHLOROTHIAZIDE 10-12.5 MG PO TABS
10-12.5 | ORAL_TABLET | Freq: Every day | ORAL | 2 refills | Status: DC
Start: 2019-10-03 — End: 2024-01-12

## 2019-10-05 NOTE — Progress Notes (Signed)
Scheduled a Nepali interpreter for procedure on 10/09/19 to meet pt in lobby at [facility] 1300 to 1430.  [employer] JOB# [id number]

## 2019-10-05 NOTE — Progress Notes (Signed)
PATIENT REACHED   YES____NO__X__    PREOP INSTRUCTIONS LEFT ON VM [phone]_____________      DATE_4/21/21________ TIME_1400________ARRIVAL_1300_______PLACE__masc__________  NOTHING TO EAT OR DRINK  AFTER MIDNIGHT THE EVENING PRIOR OR AS INSTRUCTED BY YOUR DR.  Bonita Quin NEED A RESPONSIBLE ADULT AGE 46 OR OLDER TO DRIVE YOU HOME  PLEASE BRING INSURANCE CARD.PICTURE ID AND COMPLETE LIST OF MEDS  WEAR LOOSE COMFORTABLE CLOTHING  FOLLOW ANY INSTRUCTIONS YOUR DRS OFFICE HAS GIVEN YOU,INCLUDING WHAT MEDICATIONS TO TAKE THE AM OF PROCEDURE AND WHEN AND IF YOU NEED TO STOP ANY BLOOD THINNERS. IF YOU HAVE QUESTIONS REGARDING THIS CALL THE OFFICE  THE GOAL BLOOD SUGAR THE AM OF PROCEDURE IS 200 OR LESS ABOVE THAT THE PROCEDURE MAY BE CANCELLED  ANY QUESTIONS CALL YOUR DOCTOR.ALSO,PLEASE READ THE INSTRUCTION PACKET FROM YOUR DR IF YOU RECEIVED ONE.  SPINE INTERVENTION NUMBER IS [phone]      OTHER___________________________________      VISITOR POLICY(subject to change)      There is a one visitor policy at [facility] for all surgeries and endoscopies.Whether the visitor can stay or will be asked to wait in the car will depend on the current policy and if social distancing can be maintained.The policy is subject to change at any time.Please make sure the visitor has a cell phone that is on,charged and able to accept calls, as this may be the way that the staff communicates with them.Pain management is NO VISITOR policyThe patients ride is expected to remain in the car with a cell phone for communication.If the ride is leaving the hospital grounds please make sure they are back in time for pickup. Have the patient inform the staff on arrival what their rides plans are while the patient is in the facility.At the MAIN there is one visitor allowed.Please note that the visitor policy is subject to change.

## 2019-10-10 ENCOUNTER — Ambulatory Visit: Admit: 2019-10-10 | Payer: MEDICAID

## 2019-10-10 ENCOUNTER — Inpatient Hospital Stay: Payer: MEDICAID

## 2019-10-10 LAB — POCT GLUCOSE: POC Glucose: 107 mg/dl — ABNORMAL HIGH (ref 70–99)

## 2019-10-10 LAB — PREGNANCY, URINE: HCG(Urine) Pregnancy Test: NEGATIVE

## 2019-10-10 MED ORDER — FENTANYL CITRATE (PF) 100 MCG/2ML IJ SOLN
100 MCG/2ML | Freq: Once | INTRAMUSCULAR | Status: AC | PRN
Start: 2019-10-10 — End: 2019-10-10
  Administered 2019-10-10: 18:00:00 50 via INTRAVENOUS

## 2019-10-10 MED ORDER — MIDAZOLAM HCL 2 MG/2ML IJ SOLN
2 MG/ML | Freq: Once | INTRAMUSCULAR | Status: AC | PRN
Start: 2019-10-10 — End: 2019-10-10
  Administered 2019-10-10: 18:00:00 1 via INTRAVENOUS

## 2019-10-10 MED ORDER — LIDOCAINE HCL (PF) 1 % IJ SOLN
1 % | Freq: Once | INTRAMUSCULAR | Status: AC | PRN
Start: 2019-10-10 — End: 2019-10-10
  Administered 2019-10-10: 18:00:00 3 via INTRADERMAL

## 2019-10-10 MED ORDER — SODIUM CHLORIDE (PF) 0.9 % IJ SOLN
0.9 % | Freq: Once | INTRAMUSCULAR | Status: AC | PRN
Start: 2019-10-10 — End: 2019-10-10
  Administered 2019-10-10: 18:00:00 2

## 2019-10-10 MED ORDER — MIDAZOLAM HCL 2 MG/2ML IJ SOLN
2 | INTRAMUSCULAR | Status: AC
Start: 2019-10-10 — End: 2019-10-10

## 2019-10-10 MED ORDER — DEXAMETHASONE SOD PHOSPHATE PF 10 MG/ML IJ SOLN
10 MG/ML | Freq: Once | INTRAMUSCULAR | Status: AC | PRN
Start: 2019-10-10 — End: 2019-10-10
  Administered 2019-10-10: 18:00:00 10

## 2019-10-10 MED ORDER — FENTANYL CITRATE (PF) 100 MCG/2ML IJ SOLN
100 | INTRAMUSCULAR | Status: AC
Start: 2019-10-10 — End: 2019-10-10

## 2019-10-10 MED FILL — FENTANYL CITRATE (PF) 100 MCG/2ML IJ SOLN: 100 MCG/2ML | INTRAMUSCULAR | Qty: 2

## 2019-10-10 MED FILL — MIDAZOLAM HCL 2 MG/2ML IJ SOLN: 2 mg/mL | INTRAMUSCULAR | Qty: 2

## 2019-10-10 NOTE — Op Note (Signed)
PATIENT:  Faith Mason, Faith Mason  AGE:  44 yrs  MEDICAL RECORD #:  [id number]  DATE OF BIRTH:  10/22/1975     DATE:  10/10/2019  PHYSICIAN: Bethanne Ginger, M.D.     PROCEDURE: C7-T1 interlaminar epidural steroid injection under fluoroscopy.     PRE-OP DIAGNOSIS:  Neck Pain/Radiculopathy     POST-OP DIAGNOSIS:  same     HISTORY OF PRESENT ILLNESS:  See office notes. Patient has failed previous less-invasive treatments.    Pre-op pain score: 0    Post-op pain score: 4     ALLERGIES:  Patient has no known allergies.     MEDICATIONS:    No current facility-administered medications for this encounter.         PHYSICAL EXAMINATION:              General:  Awake, alert              Heart:  No audible murmurs, extremities well perfused              Lungs:  No increased WOB or audible wheezing              Extremities:  Normal tone. Warm. No swelling.      Anesthesia: 1 mg Versed and 50 mcg fentanyl    Estimated blood loss: None    DESCRIPTION OF PROCEDURE:     Components of the procedure were again reviewed with the patient prior to the procedure.  She is aware of risks including infection, bleeding, allergic reaction, and nerve injury.  She had ample opportunity for additional questions.  She elected to proceed with treatment.     The patient was placed in the prone position.  Cardiovascular monitoring was initiated, and vital signs were stable prior to, during, and after the procedure.  Utilizing fluoroscopy, the C7-T1 vertebrae were identified.  The area was sterilely prepped and draped. Skin anesthesia was achieved at the entry site using 1-2 cc of Lidocaine 1%. A 22 g 3.50 inch Touhy spinal needle was slowly inserted into the C7-T1 interlaminar epidural space using AP, lateral and oblique fluoroscopic imaging and loss of resistance technique. Negative aspiration was confirmed. 1 cc Isovue-M 300 was injected showing contrast spread into the epidural space.  A combination of 2 cc normal saline and 10 mg dexamethasone were slowly  injected. The needle was removed after the stylet was repositioned. A sterile bandage was applied.  The patient was brought to recovery in stable condition.The patient tolerated the procedure well.     DISPOSITION:  The patient was transported to recovery.  The patient was monitored for 15 to 20 minutes post-procedure.  Precautions were discussed and written instructions provided.    Comment: None

## 2019-10-10 NOTE — Discharge Instructions (Signed)
DR. STAGGS DISCHARGE INSTRUCTIONS           1. Do not drive today for ( 8 hours with no sedation)  (24 hours if had sedation)     2. If you received sedation during your procedure, be advised for the next 24 hour       Do not drink alcohol    Do not operate machinery    Do not make any important decisions or sign any legal documents    You may experience light headedness,dizziness or sleepiness     3. You may apply ice for 15 minutes 4-5 times a day as needed.     4. No heating pad for 48 hours     5.Follow up with Dr. Staggs as scheduled     6.Keep site dry for 12 hours after procedure, then remove bandaid.     7.It may take 24-48 hours to feel improvement.     8. Side effects of Steroids may include:      Elevated glucose levels ( in diabetics)    Fluid retention    Tenderness at site    Nervous energy    Sleeplessness    Muscle spasms    Facial flushing    Hot flashes     9. Call if:    Your symptoms worsen severely    You experience a severe headache that worsens when upright    You develop a fever greater than 101 degrees     10. Call [phone] for follow up appointments, questions or problems                             11. You may restart any blood thinning medications tomorrow.

## 2019-10-10 NOTE — Progress Notes (Signed)
IV discontinued, catheter intact, and dressing applied.    Procedural dressing dry and intact.    Bilateral extremities equal in strength.    Discharge instructions reviewed with patient or responsible adult, signed and copy given.  All home medications have been reviewed.  All questions answered and patient or responsible adult verbalized understanding.  PAIN LEVEL AT DISCHARGE _4____

## 2019-10-10 NOTE — Progress Notes (Signed)
Teaching / education initiated regarding perioperative experience, expectations, and pain management during stay. Patient verbalized understanding. Nepali interpreter at bedside.  Khem  [phone] with ALS.  Interpreter at bedside at all times. Alveta Heimlich RN

## 2019-10-10 NOTE — H&P (Signed)
HISTORY AND PHYSICAL/PRE-SEDATION ASSESSMENT    Patient:  Faith Mason, Faith Mason   DOB:  Nov 28, 1975  Medical Record No.:  [id number]   Date:  10/10/2019  Physician:  Sheryle Spray, MD  Facility: [facility]:                 The patient is a 44 y.o. female whom presents with neck pain. Review of the imaging and physical exam of the patient confirmed the pre-procedure diagnosis.  After a thorough discussion of risks, benefits and alternatives informed consent was obtained.    Diagnosis:  M54.12  CERVICAL RADICULOPATHY    Past Medical History:   Past Medical History:   Diagnosis Date   ??? GERD (gastroesophageal reflux disease)    ??? Hypertension 2016        Past Surgical History:     Past Surgical History:   Procedure Laterality Date   ??? BACK SURGERY  2017    L-spine   ??? CERVICAL SPINE SURGERY N/A 08/15/2019    C7T1 INTERLAMINAR EPIDURAL STEROID INJECTION WITH FLUOROSCOPY performed by Sheryle Spray, MD at [facility]       Current Medications:   Prior to Admission medications    Medication Sig Start Date End Date Taking? Authorizing Provider   lisinopril-hydroCHLOROthiazide (PRINZIDE;ZESTORETIC) 10-12.5 MG per tablet Take 1 tablet by mouth daily 10/03/19  Yes Pat Kocher Davit, MD   blood glucose monitor strips Test bid 10/03/19  Yes Pat Kocher Davit, MD   blood glucose monitor strips Test 2 times a day & as needed for symptoms of irregular blood glucose. Ok to dispense what is covered by insurance 09/27/19  Yes [name], MD   Lancets MISC BID testing 08/30/19  Yes [name], MD   glucose monitoring kit (FREESTYLE) monitoring kit 1 each by Does not apply route once for 1 dose Glucometer:QD-BID testing. Ok to provide insurance covered device. 06/17/18 10/03/19  [name], MD       Allergies:  Patient has no known allergies.    Social History:    reports that she has never smoked. She has never used smokeless tobacco. She reports that she does not drink alcohol or use  drugs.    Family History:   Family History   Problem Relation Age of Onset   ??? High Blood Pressure Mother    ??? Diabetes Mother    ??? High Blood Pressure Father    ??? Diabetes Father         Vitals: Blood pressure 134/83, pulse 64, temperature 97 ??F (36.1 ??C), temperature source Temporal, resp. rate 15, height '4\' 11"'$  (1.499 m), weight 133 lb (60.3 kg), last menstrual period 10/10/2019, SpO2 97 %.    PHYSICAL EXAM:  HENT: Airway patent and reviewed  Cardiovascular: Normal rate, regular rhythm, normal heart sounds.   Pulmonary/Chest: No wheezes. No rhonchi. No rales.   Abdominal: Soft. Bowel sounds are normal. No distension.  Extremities: Moves all extremities equally  Lumbar Spine: Painful range of motion, no midline tenderness     ASA CLASS:         '[]'$    I. Normal, healthy adult           '[x]'$    II.  Mild systemic disease            '[]'$    III.  Severe systemic disease    Mallampati: Mallampati Class II - (soft palate, fauces &  uvula are visible)      Sedation plan:   []   Local              [x]   Minimal                  []   General anesthesia    Treatment plan:  Patient's condition acceptable for planned procedure/sedation.  Proceed with planned procedure   Post Procedure Plan   Return to same level of care   ______________________     The risks and benefits as well as alternatives to the procedure have been discussed with the patient and or family.  The patient and/or next of kin understands and agrees to proceed.    Sheryle Spray, MD

## 2020-01-15 NOTE — Telephone Encounter (Signed)
Pt's daughter, Marchelle Gearing, called to f/u on request for [facility] paperwork for pt that was sent a month ago  CB: [phone]

## 2020-01-15 NOTE — Telephone Encounter (Signed)
Lm for pt informed that form has not been received or scanned into chart. Fax number left on VM. Also informed that blood work orders are in the chart for pt to get fasting blood work done.

## 2020-01-15 NOTE — Telephone Encounter (Signed)
Note continued....  Pt's daughter also wants to know if diabetes lab and cholesterol lab is put in as wants to schedule labs; (daughter not on HIPAA so unable to disclose

## 2020-01-29 NOTE — Telephone Encounter (Signed)
-----   Message from April Clay sent at 01/29/2020  2:11 PM EDT -----  Subject: Appointment Request    Reason for Call: Routine (Patient Request) No Script    QUESTIONS  Type of Appointment? Established Patient  Reason for appointment request? Available appointments did not meet   patient need  Additional Information for Provider? Pt daughter ci to schedule an appt   and 11/8 was the soonest and she states that she needs to talk to the Dr   or nurse Lowanda Foster  ---------------------------------------------------------------------------  --------------  Cleotis Lema INFO  What is the best way for the office to contact you? OK to leave message on   voicemail  Preferred Call Back Phone Number? [phone]  ---------------------------------------------------------------------------  --------------  SCRIPT ANSWERS  Relationship to Patient? Other  Representative Name? Tika   Additional information verified (besides Name and Date of Birth)? Address  (Is the patient requesting to see the provider for a procedure?)? No  (Is the patient requesting to see the provider urgently ??? today or   tomorrow.)? No  Have you been diagnosed with, awaiting test results for, or told that you   are suspected of having COVID-19 (Coronavirus)? (If patient has tested   negative or was tested as a requirement for work, school, or travel and   not based on symptoms, answer no)? No  Do you currently have flu-like symptoms including fever or chills, cough,   shortness of breath, difficulty breathing, or new loss of taste or smell?   No  Have you had close contact with someone with COVID-19 in the last 14 days?   No  (Service Expert ??? click yes below to proceed with [employer] As Usual   Scheduling)? Yes

## 2020-01-29 NOTE — Telephone Encounter (Signed)
Lm for pt's daughter to call back to schedule with NP

## 2020-08-21 NOTE — Progress Notes (Signed)
[place]ali interpreter scheduled for 09/17/2020 at 0630 to meet in the lobby at [facility] main entrance. For 5 hours. Job# [id number] [employer].

## 2020-08-21 NOTE — Progress Notes (Signed)
Name_______________________________________Printed:____________________  Date and time of surgery___3/30/2022_________0800____________Arrival Time:___0630_____________   1. The instructions given regarding when and if a patient needs to stop oral intake prior to surgery varies.Follow the specific instructions you were given                  __x_Nothing to eat or to drink after Midnight the night before.                   ____Carbo loading or ERAS instructions will be given to select patients-if you have been given those instructions -please do the following                           The evening before your surgery after dinner before midnight drink 40 ounces of gatorade.If you are diabetic use sugar free.  The morning of surgery drink 40 ounces of water.This needs to be finished 3 hours prior to your surgery start time.    2. Take the following pills with a small sip of water on the morning of surgery___________________________________________________                  Do not take blood pressure medications ending in pril or sartan the eve prior to surgery or the morning of surgery_   3. Aspirin, Ibuprofen, Advil, Naproxen, Vitamin E and other Anti-inflammatory products and supplements should be stopped for 5 -7days before surgery or as directed by your physician.   4. Check with your Doctor regarding stopping Plavix, Coumadin,Eliquis, Lovenox,Effient,Pradaxa,Xarelto, Fragmin or other blood thinners and follow their instructions.   5. Do not smoke, and do not drink any alcoholic beverages 24 hours prior to surgery.  This includes NA Beer.Refrain from the usage of any recreational drugs.   6. You may brush your teeth and gargle the morning of surgery.  DO NOT SWALLOW WATER   7. You MUST make arrangements for a responsible adult to stay on site while you are here and take you home after your surgery. You will not be allowed to leave alone or drive yourself home.  It is strongly suggested someone stay with you the first  24 hrs. Your surgery will be cancelled if you do not have a ride home.   8. A parent/legal guardian must accompany a child scheduled for surgery and plan to stay at the hospital until the child is discharged.  Please do not bring other children with you.   9. Please wear simple, loose fitting clothing to the hospital.  Do not bring valuables (money, credit cards, checkbooks, etc.) Do not wear any makeup (including no eye makeup) or nail polish on your fingers or toes.             10. DO NOT wear any jewelry or piercings on day of surgery.  All body piercing jewelry must be removed.             11. If you have ___dentures, they will be removed before going to the OR; we will provide you a container.  If you wear ___contact lenses or ___glasses, they will be removed; please bring a case for them.             12. Please see your family doctor/pediatrician for a history & physical and/or concerning medications.  Bring any test results/reports from your physician's office.   PCP__________________Phone___________H&P Appt. Date________             13 If  you  have a Living Will and Durable Power of Attorney for Healthcare, please bring in a copy.             59. Notify your Surgeon if you develop any illness between now and surgery  time, cough, cold, fever, sore throat, nausea, vomiting, etc.  Please notify your surgeon if you experience dizziness, shortness of breath or blurred vision between now & the time of your surgery             15. DO NOT shave your operative site 96 hours prior to surgery. For face & neck surgery, men may use an electric razor 48 hours prior to surgery.             16. Shower the night before or morning of surgery using an antibacterial soap or as you have been instructed.             17. To provide excellent care visitors will be limited to one in the room at any given time.             18.  Please bring picture ID and insurance card.             19.  Visit our web site for additional  information:  [email or web address]              20.During flu season no children under the age of 92 are permitted in the hospital for the safety of all patients.                              21. If you take a long acting insulin in the evening only  take half of your usual  dose the night  before your procedure              22. If you use a c-pap please bring DOS if staying overnight,             23.For your convenience [facility] has a pharmacy on site to fill your prescriptions.             24. If you use oxygen and have a portable tank please bring it  with you the DOS             25. Bring a complete list of all your medications with name and dose include any supplements.             26. Other__________________________________________   *Please call pre admission testing if you any further questions   [facility]         [address]   [place]    [place]  [phone]   [place]    _x  __ Covid test to be done 3-5 days prior to scheduled surgery -patient aware they are REQUIRED to bring a copy of the negative result DOS-if they receive a positive result to notify their surgeon         If known - indicate where patient is getting covid test done ___________________________________________________________    ___ [facility] - DOS    ___ Other___________________________________      Ulis Rias POLICY(subject to change)    There is a one visitor policy at [facility] for all surgeries and endoscopies.Whether the  visitor can stay or will be asked to wait in the car will depend on the current policy and if social distancing can be maintained.The policy is subject to change at any time.Please make sure the visitor has a cell phone that is on,charged and able to accept calls, as this may be the way that the staff communicates with them.Pain management is NO VISITOR policyThe patients ride is expected to remain in the car with a cell phone for communication.If the  ride is leaving the hospital grounds please make sure they are back in time for pickup. Have the patient inform the staff on arrival what their rides plans are while the patient is in the facility.At the MAIN there is one visitor allowed.Please note that the visitor policy is subject to change.       All above information reviewed with patient in person or by phone.Patient verbalizes understanding.All questions and concerns addressed.                                                                                                 Patient/Rep_patient___________________                                                                                                                                    PRE OP INSTRUCTIONS

## 2020-09-11 ENCOUNTER — Inpatient Hospital Stay: Payer: MEDICAID

## 2020-09-11 DIAGNOSIS — Z01818 Encounter for other preprocedural examination: Secondary | ICD-10-CM

## 2020-09-11 LAB — BASIC METABOLIC PANEL
Anion Gap: 13 (ref 3–16)
BUN: 12 mg/dL (ref 7–20)
CO2: 26 mmol/L (ref 21–32)
Calcium: 9.8 mg/dL (ref 8.3–10.6)
Chloride: 102 mmol/L (ref 99–110)
Creatinine: 0.7 mg/dL (ref 0.6–1.1)
GFR African American: >60 (ref >60)
GFR Non-African American: >60 (ref >60)
Glucose: 103 mg/dL — ABNORMAL HIGH (ref 70–99)
Potassium: 4.6 mmol/L (ref 3.5–5.1)
Sodium: 141 mmol/L (ref 136–145)

## 2020-09-11 LAB — CBC WITH AUTO DIFFERENTIAL
Basophils %: 0.4 %
Basophils Absolute: 0 10*3/uL (ref 0.0–0.2)
Eosinophils %: 1 %
Eosinophils Absolute: 0.1 10*3/uL (ref 0.0–0.6)
Hematocrit: 39.3 % (ref 36.0–48.0)
Hemoglobin: 13.2 g/dL (ref 12.0–16.0)
Lymphocytes %: 42.3 %
Lymphocytes Absolute: 3.3 10*3/uL (ref 1.0–5.1)
MCH: 28.6 pg (ref 26.0–34.0)
MCHC: 33.5 g/dL (ref 31.0–36.0)
MCV: 85.4 fL (ref 80.0–100.0)
MPV: 10.1 fL (ref 5.0–10.5)
Monocytes %: 4.7 %
Monocytes Absolute: 0.4 10*3/uL (ref 0.0–1.3)
Neutrophils %: 51.6 %
Neutrophils Absolute: 4.1 10*3/uL (ref 1.7–7.7)
Platelets: 209 10*3/uL (ref 135–450)
RBC: 4.61 M/uL (ref 4.00–5.20)
RDW: 13.1 % (ref 12.4–15.4)
WBC: 7.8 10*3/uL (ref 4.0–11.0)

## 2020-09-11 LAB — TYPE AND SCREEN
ABO/Rh: O POS
Antibody Screen: NEGATIVE

## 2020-09-17 ENCOUNTER — Inpatient Hospital Stay: Payer: MEDICAID

## 2020-09-17 LAB — PREGNANCY, URINE: HCG(Urine) Pregnancy Test: NEGATIVE

## 2020-09-17 LAB — TYPE AND SCREEN
ABO/Rh: O POS
Antibody Screen: NEGATIVE

## 2020-09-17 MED ORDER — LIDOCAINE HCL (PF) 2 % IJ SOLN
2 % | INTRAMUSCULAR | Status: DC | PRN
Start: 2020-09-17 — End: 2020-09-17
  Administered 2020-09-17: 12:00:00 100 via INTRAVENOUS

## 2020-09-17 MED ORDER — APREPITANT 40 MG PO CAPS
40 MG | ORAL | Status: DC
Start: 2020-09-17 — End: 2020-09-17

## 2020-09-17 MED ORDER — FENTANYL CITRATE (PF) 100 MCG/2ML IJ SOLN
100 | INTRAMUSCULAR | Status: AC
Start: 2020-09-17 — End: 2020-09-17

## 2020-09-17 MED ORDER — MIDAZOLAM HCL (PF) 2 MG/2ML IJ SOLN
2 MG/ML | Freq: Once | INTRAMUSCULAR | Status: DC | PRN
Start: 2020-09-17 — End: 2020-09-17

## 2020-09-17 MED ORDER — IBUPROFEN 600 MG PO TABS
600 MG | ORAL_TABLET | Freq: Four times a day (QID) | ORAL | 1 refills | Status: AC | PRN
Start: 2020-09-17 — End: ?

## 2020-09-17 MED ORDER — HYDRALAZINE HCL 20 MG/ML IJ SOLN
20 MG/ML | INTRAMUSCULAR | Status: DC | PRN
Start: 2020-09-17 — End: 2020-09-17

## 2020-09-17 MED ORDER — PROPOFOL 200 MG/20ML IV EMUL
200 | INTRAVENOUS | Status: DC | PRN
Start: 2020-09-17 — End: 2020-09-17
  Administered 2020-09-17: 12:00:00 100 via INTRAVENOUS

## 2020-09-17 MED ORDER — ROCURONIUM BROMIDE 50 MG/5ML IV SOLN
50 | INTRAVENOUS | Status: AC
Start: 2020-09-17 — End: 2020-09-17

## 2020-09-17 MED ORDER — FENTANYL CITRATE (PF) 250 MCG/5ML IJ SOLN
250 MCG/5ML | INTRAMUSCULAR | Status: DC | PRN
Start: 2020-09-17 — End: 2020-09-17

## 2020-09-17 MED ORDER — LABETALOL HCL 5 MG/ML IV SOLN
5 | INTRAVENOUS | Status: DC | PRN
Start: 2020-09-17 — End: 2020-09-17

## 2020-09-17 MED ORDER — SUCCINYLCHOLINE CHLORIDE 200 MG/10ML IV SOSY
200 | INTRAVENOUS | Status: AC
Start: 2020-09-17 — End: 2020-09-17

## 2020-09-17 MED ORDER — SODIUM CHLORIDE 0.9 % IR SOLN
0.9 % | Status: AC | PRN
Start: 2020-09-17 — End: 2020-09-17
  Administered 2020-09-17: 13:00:00 500

## 2020-09-17 MED ORDER — ONDANSETRON HCL 4 MG/2ML IJ SOLN
4 | INTRAMUSCULAR | Status: AC
Start: 2020-09-17 — End: 2020-09-17

## 2020-09-17 MED ORDER — HALOPERIDOL LACTATE 5 MG/ML IJ SOLN
5 MG/ML | Freq: Once | INTRAMUSCULAR | Status: DC | PRN
Start: 2020-09-17 — End: 2020-09-17

## 2020-09-17 MED ORDER — SUCCINYLCHOLINE CHLORIDE 200 MG/10ML IV SOSY
200 MG/10ML | INTRAVENOUS | Status: DC | PRN
Start: 2020-09-17 — End: 2020-09-17
  Administered 2020-09-17: 12:00:00 120 via INTRAVENOUS

## 2020-09-17 MED ORDER — BUPIVACAINE HCL (PF) 0.5 % IJ SOLN
0.5 | INTRAMUSCULAR | Status: AC
Start: 2020-09-17 — End: 2020-09-17

## 2020-09-17 MED ORDER — OXYCODONE-ACETAMINOPHEN 5-325 MG PO TABS
5-325 MG | ORAL_TABLET | Freq: Four times a day (QID) | ORAL | 0 refills | Status: AC | PRN
Start: 2020-09-17 — End: 2020-09-24

## 2020-09-17 MED ORDER — KETOROLAC TROMETHAMINE 60 MG/2ML IM SOLN
60 | INTRAMUSCULAR | Status: AC
Start: 2020-09-17 — End: 2020-09-17

## 2020-09-17 MED ORDER — DEXAMETHASONE SODIUM PHOSPHATE 20 MG/5ML IJ SOLN
20 | INTRAMUSCULAR | Status: AC
Start: 2020-09-17 — End: 2020-09-17

## 2020-09-17 MED ORDER — ONDANSETRON HCL 4 MG/2ML IJ SOLN
4 MG/2ML | Freq: Once | INTRAMUSCULAR | Status: DC | PRN
Start: 2020-09-17 — End: 2020-09-17

## 2020-09-17 MED ORDER — APREPITANT 40 MG PO CAPS
40 MG | Freq: Once | ORAL | Status: AC
Start: 2020-09-17 — End: 2020-09-17
  Administered 2020-09-17: 12:00:00 via ORAL

## 2020-09-17 MED ORDER — KETOROLAC TROMETHAMINE 60 MG/2ML IM SOLN
60 MG/2ML | INTRAMUSCULAR | Status: DC | PRN
Start: 2020-09-17 — End: 2020-09-17
  Administered 2020-09-17: 13:00:00 15 via INTRAVENOUS

## 2020-09-17 MED ORDER — CEFAZOLIN 2000 MG D5W 50 ML IVPB
Status: AC
Start: 2020-09-17 — End: 2020-09-17
  Administered 2020-09-17: 12:00:00 via INTRAVENOUS

## 2020-09-17 MED ORDER — OXYCODONE HCL 5 MG PO TABS
5 MG | ORAL | Status: AC | PRN
Start: 2020-09-17 — End: 2020-09-17
  Administered 2020-09-17: 14:00:00 10 mg via ORAL

## 2020-09-17 MED ORDER — FENTANYL CITRATE (PF) 250 MCG/5ML IJ SOLN
250 MCG/5ML | INTRAMUSCULAR | Status: DC | PRN
Start: 2020-09-17 — End: 2020-09-17
  Administered 2020-09-17 (×2): 25 ug via INTRAVENOUS

## 2020-09-17 MED ORDER — ACETAMINOPHEN 325 MG PO TABS
325 MG | ORAL | Status: DC
Start: 2020-09-17 — End: 2020-09-17

## 2020-09-17 MED ORDER — ACETAMINOPHEN 325 MG PO TABS
325 MG | Freq: Once | ORAL | Status: AC
Start: 2020-09-17 — End: 2020-09-17
  Administered 2020-09-17: 12:00:00 via ORAL

## 2020-09-17 MED ORDER — OXYCODONE HCL 5 MG PO TABS
5 MG | ORAL | Status: AC | PRN
Start: 2020-09-17 — End: 2020-09-17

## 2020-09-17 MED ORDER — DEXAMETHASONE SODIUM PHOSPHATE 20 MG/5ML IJ SOLN
20 MG/5ML | INTRAMUSCULAR | Status: DC | PRN
Start: 2020-09-17 — End: 2020-09-17
  Administered 2020-09-17: 12:00:00 10 via INTRAVENOUS

## 2020-09-17 MED ORDER — LACTATED RINGERS IV SOLN
INTRAVENOUS | Status: DC
Start: 2020-09-17 — End: 2020-09-17
  Administered 2020-09-17: 12:00:00 via INTRAVENOUS

## 2020-09-17 MED ORDER — SUGAMMADEX SODIUM 200 MG/2ML IV SOLN
200 | INTRAVENOUS | Status: DC | PRN
Start: 2020-09-17 — End: 2020-09-17
  Administered 2020-09-17: 13:00:00 120 via INTRAVENOUS

## 2020-09-17 MED ORDER — LIDOCAINE HCL (PF) 1 % IJ SOLN
1 % | Freq: Once | INTRAMUSCULAR | Status: DC
Start: 2020-09-17 — End: 2020-09-17

## 2020-09-17 MED ORDER — ROCURONIUM BROMIDE 50 MG/5ML IV SOLN
50 MG/5ML | INTRAVENOUS | Status: DC | PRN
Start: 2020-09-17 — End: 2020-09-17
  Administered 2020-09-17: 12:00:00 25 via INTRAVENOUS
  Administered 2020-09-17: 12:00:00 5 via INTRAVENOUS

## 2020-09-17 MED ORDER — DEXMEDETOMIDINE HCL 200 MCG/2ML IV SOLN
200 MCG/2ML | INTRAVENOUS | Status: DC | PRN
Start: 2020-09-17 — End: 2020-09-17
  Administered 2020-09-17 (×2): 8 via INTRAVENOUS

## 2020-09-17 MED ORDER — MEPERIDINE HCL 25 MG/ML IJ SOLN
25 MG/ML | INTRAMUSCULAR | Status: DC | PRN
Start: 2020-09-17 — End: 2020-09-17

## 2020-09-17 MED ORDER — SUGAMMADEX SODIUM 200 MG/2ML IV SOLN
200 | INTRAVENOUS | Status: AC
Start: 2020-09-17 — End: 2020-09-17

## 2020-09-17 MED ORDER — MIDAZOLAM HCL 2 MG/2ML IJ SOLN
2 | INTRAMUSCULAR | Status: AC
Start: 2020-09-17 — End: 2020-09-17

## 2020-09-17 MED ORDER — PROPOFOL 200 MG/20ML IV EMUL
200 | INTRAVENOUS | Status: AC
Start: 2020-09-17 — End: 2020-09-17

## 2020-09-17 MED ORDER — DIPHENHYDRAMINE HCL 50 MG/ML IJ SOLN
50 MG/ML | Freq: Once | INTRAMUSCULAR | Status: DC | PRN
Start: 2020-09-17 — End: 2020-09-17

## 2020-09-17 MED ORDER — KETAMINE HCL 30 MG/3ML IJ SOSY
30 | INTRAMUSCULAR | Status: AC
Start: 2020-09-17 — End: 2020-09-17

## 2020-09-17 MED ORDER — ONDANSETRON HCL 4 MG/2ML IJ SOLN
4 MG/2ML | INTRAMUSCULAR | Status: DC | PRN
Start: 2020-09-17 — End: 2020-09-17
  Administered 2020-09-17: 12:00:00 4 via INTRAVENOUS

## 2020-09-17 MED ORDER — KETAMINE HCL 30 MG/3ML IJ SOSY
30 MG/3ML | INTRAMUSCULAR | Status: DC | PRN
Start: 2020-09-17 — End: 2020-09-17
  Administered 2020-09-17 (×3): 10 via INTRAVENOUS

## 2020-09-17 MED ORDER — LIDOCAINE HCL (PF) 2 % IJ SOLN
2 | INTRAMUSCULAR | Status: AC
Start: 2020-09-17 — End: 2020-09-17

## 2020-09-17 MED ORDER — MIDAZOLAM HCL 2 MG/2ML IJ SOLN
2 MG/ML | INTRAMUSCULAR | Status: DC | PRN
Start: 2020-09-17 — End: 2020-09-17
  Administered 2020-09-17: 12:00:00 2 via INTRAVENOUS

## 2020-09-17 MED ORDER — BUPIVACAINE HCL (PF) 0.5 % IJ SOLN
0.5 % | Freq: Once | INTRAMUSCULAR | Status: AC | PRN
Start: 2020-09-17 — End: 2020-09-17
  Administered 2020-09-17: 13:00:00 25

## 2020-09-17 MED ORDER — FENTANYL CITRATE (PF) 100 MCG/2ML IJ SOLN
100 MCG/2ML | INTRAMUSCULAR | Status: DC | PRN
Start: 2020-09-17 — End: 2020-09-17
  Administered 2020-09-17 (×2): 50 via INTRAVENOUS

## 2020-09-17 MED FILL — FENTANYL CITRATE (PF) 100 MCG/2ML IJ SOLN: 100 MCG/2ML | INTRAMUSCULAR | Qty: 2

## 2020-09-17 MED FILL — DIPRIVAN 200 MG/20ML IV EMUL: 200 MG/20ML | INTRAVENOUS | Qty: 20

## 2020-09-17 MED FILL — MAPAP 325 MG PO TABS: 325 mg | ORAL | Qty: 2

## 2020-09-17 MED FILL — MIDAZOLAM HCL 2 MG/2ML IJ SOLN: 2 mg/mL | INTRAMUSCULAR | Qty: 2

## 2020-09-17 MED FILL — ROCURONIUM BROMIDE 50 MG/5ML IV SOLN: 50 MG/5ML | INTRAVENOUS | Qty: 5

## 2020-09-17 MED FILL — BRIDION 200 MG/2ML IV SOLN: 200 MG/2ML | INTRAVENOUS | Qty: 2

## 2020-09-17 MED FILL — ONDANSETRON HCL 4 MG/2ML IJ SOLN: 4 MG/2ML | INTRAMUSCULAR | Qty: 2

## 2020-09-17 MED FILL — FENTANYL CITRATE (PF) 250 MCG/5ML IJ SOLN: 250 MCG/5ML | INTRAMUSCULAR | Qty: 5

## 2020-09-17 MED FILL — OXYCODONE HCL 5 MG PO TABS: 5 mg | ORAL | Qty: 2

## 2020-09-17 MED FILL — KETOROLAC TROMETHAMINE 60 MG/2ML IM SOLN: 60 MG/2ML | INTRAMUSCULAR | Qty: 2

## 2020-09-17 MED FILL — KETAMINE HCL 30 MG/3ML IJ SOSY: 30 MG/3ML | INTRAMUSCULAR | Qty: 3

## 2020-09-17 MED FILL — SUCCINYLCHOLINE CHLORIDE 200 MG/10ML IV SOSY: 200 MG/10ML | INTRAVENOUS | Qty: 10

## 2020-09-17 MED FILL — XYLOCAINE-MPF 2 % IJ SOLN: 2 % | INTRAMUSCULAR | Qty: 5

## 2020-09-17 MED FILL — APREPITANT 40 MG PO CAPS: 40 mg | ORAL | Qty: 1

## 2020-09-17 MED FILL — DEXAMETHASONE SODIUM PHOSPHATE 20 MG/5ML IJ SOLN: 20 MG/5ML | INTRAMUSCULAR | Qty: 5

## 2020-09-17 MED FILL — BUPIVACAINE HCL (PF) 0.5 % IJ SOLN: 0.5 % | INTRAMUSCULAR | Qty: 30

## 2020-09-17 NOTE — Progress Notes (Signed)
Discharge instructions reviewed with patient, patient husband, and patient's daughter. Interpreter at bedside. All questions answered thus far. Pt abdomen sites remain unchanged from previous assessment. Pt states pain is improving from from a 9 to an 8. Pt appears to be resting comfortably in bed and no distress. Vital signs remain stable.

## 2020-09-17 NOTE — Progress Notes (Signed)
Pt in phase two of care. 3 lap sites are C.D.I. Pt resting comfortably in and tolerating PO intake of water. Interpreter remaining at bedside.

## 2020-09-17 NOTE — H&P (Signed)
Date of Surgery Update:  Faith Mason was seen, history and physical examination reviewed, and patient examined by me today. There have been no significant clinical changes since the completion of the previous history and physical.    The risk, benefits, and alternatives of the proposed procedure have been explained to the patient (or appropriate guardian) and understanding verbalized. All questions answered. Patient wishes to proceed.    Electronically signed by: Wyn Quaker, MD,09/17/2020,7:56 AM

## 2020-09-17 NOTE — Anesthesia Pre-Procedure Evaluation (Signed)
Department of Anesthesiology  Preprocedure Note       Name:  Faith Mason   Age:  45 y.o.  DOB:  1975/08/08                                          MRN:  [id number]         Date:  09/17/2020      Surgeon: Juliann Mule):  Meribeth Mattes, MD    Procedure: Procedure(s):  ROBOTIC ASSISTED LEFT SALPINGO OOPHORECTOMY WITH CYSTECTOMY AND PELVIC WASHINGS [phone], [phone])    Medications prior to admission:   Prior to Admission medications    Medication Sig Start Date End Date Taking? Authorizing Provider   lisinopril-hydroCHLOROthiazide (PRINZIDE;ZESTORETIC) 10-12.5 MG per tablet Take 1 tablet by mouth daily 10/03/19   [name], MD   blood glucose monitor strips Test bid 10/03/19   [name], MD   blood glucose monitor strips Test 2 times a day & as needed for symptoms of irregular blood glucose. Ok to dispense what is covered by insurance 09/27/19   [name], MD   Lancets MISC BID testing 08/30/19   [name], MD   glucose monitoring kit (FREESTYLE) monitoring kit 1 each by Does not apply route once for 1 dose Glucometer:QD-BID testing. Ok to provide insurance covered device. 06/17/18 10/03/19  [name], MD       Current medications:    Current Facility-Administered Medications   Medication Dose Route Frequency Provider Last Rate Last Admin   ??? lactated ringers infusion   IntraVENous Continuous Meribeth Mattes, MD       ??? lidocaine PF 1 % injection 0.5 mL  0.5 mL IntraDERmal Once Meribeth Mattes, MD       ??? ceFAZolin (ANCEF) 2000 mg in dextrose 5 % 50 mL IVPB  2,000 mg IntraVENous On Call to [facility], MD       ??? meperidine (DEMEROL) injection 12.5 mg  12.5 mg IntraVENous Q5 Min PRN Lanelle Bal, MD       ??? fentaNYL (SUBLIMAZE) injection 25 mcg  25 mcg IntraVENous Q5 Min PRN Lanelle Bal, MD       ??? fentaNYL (SUBLIMAZE) injection 50 mcg  50 mcg IntraVENous Q5 Min PRN Lanelle Bal, MD       ??? oxyCODONE (ROXICODONE) immediate release tablet 5 mg  5 mg Oral PRN Lanelle Bal,  MD        Or   ??? oxyCODONE (ROXICODONE) immediate release tablet 10 mg  10 mg Oral PRN Lanelle Bal, MD       ??? haloperidol lactate (HALDOL) injection 1 mg  1 mg IntraVENous Once PRN Lanelle Bal, MD       ??? ondansetron (ZOFRAN) injection 4 mg  4 mg IntraVENous Once PRN Lanelle Bal, MD       ??? diphenhydrAMINE (BENADRYL) injection 12.5 mg  12.5 mg IntraVENous Once PRN Lanelle Bal, MD       ??? labetalol (NORMODYNE;TRANDATE) injection 10 mg  10 mg IntraVENous Q15 Min PRN Lanelle Bal, MD        Or   ??? hydrALAZINE (APRESOLINE) injection 10 mg  10 mg IntraVENous Q15 Min PRN Lanelle Bal, MD           Allergies:  No Known Allergies  Problem List:    Patient Active Problem List   Diagnosis Code   ??? Cervical radicular pain M54.12   ??? Paresthesia and pain of both upper extremities R20.2, M79.601, M79.602   ??? Cervical stenosis of spinal canal M48.02   ??? 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 GERD (gastroesophageal reflux disease)    ??? Hypertension 2016       Past Surgical History:        Procedure Laterality Date   ??? BACK SURGERY  2017    L-spine   ??? CERVICAL SPINE SURGERY N/A 08/15/2019    C7T1 INTERLAMINAR EPIDURAL STEROID INJECTION WITH FLUOROSCOPY performed by Sheryle Spray, MD at [facility]   ??? PAIN MANAGEMENT PROCEDURE N/A 10/10/2019    C7T1 INTERLAMINAR EPIDURAL STEROID INJECTION WITH FLUOROSCOPY performed by Sheryle Spray, MD at [facility] History:    Social History     Tobacco Use   ??? Smoking status: Never Smoker   ??? Smokeless tobacco: Never Used   Substance Use Topics   ??? Alcohol use: Never                                Counseling given: Not Answered      Vital Signs (Current):   Vitals:    08/21/20 1412 09/17/20 0725   BP:  (!) 152/82   Pulse:  72   Resp:  17   Temp:  98.2 ??F (36.8 ??C)   TempSrc:  Temporal   SpO2:  99%   Weight: 130 lb (59 kg) 135 lb (61.2 kg)   Height: '4\' 11"'   (1.499 m)                                               BP Readings from Last 3 Encounters:   09/17/20 (!) 152/82   10/10/19 130/68   08/15/19 128/77       NPO Status: Time of last liquid consumption: 2200                        Time of last solid consumption: 2200                        Date of last liquid consumption: 09/16/20                        Date of last solid food consumption: 09/16/20    BMI:   Wt Readings from Last 3 Encounters:   09/17/20 135 lb (61.2 kg)   10/10/19 133 lb (60.3 kg)   08/15/19 140 lb (63.5 kg)     Body mass index is 27.27 kg/m??.    CBC:   Lab Results   Component Value Date    WBC 7.8 09/11/2020    RBC 4.61 09/11/2020    HGB 13.2 09/11/2020    HCT 39.3 09/11/2020    MCV 85.4 09/11/2020    RDW 13.1 09/11/2020    PLT 209 09/11/2020       CMP:   Lab Results  Component Value Date    NA 141 09/11/2020    K 4.6 09/11/2020    CL 102 09/11/2020    CO2 26 09/11/2020    BUN 12 09/11/2020    CREATININE 0.7 09/11/2020    GFRAA >60 09/11/2020    AGRATIO 1.3 09/21/2019    LABGLOM >60 09/11/2020    GLUCOSE 103 09/11/2020    PROT 7.6 09/21/2019    CALCIUM 9.8 09/11/2020    BILITOT 0.4 09/21/2019    ALKPHOS 109 09/21/2019    AST 16 09/21/2019    ALT 13 09/21/2019       POC Tests: No results for input(s): POCGLU, POCNA, POCK, POCCL, POCBUN, POCHEMO, POCHCT in the last 72 hours.    Coags: No results found for: PROTIME, INR, APTT    HCG (If Applicable):   Lab Results   Component Value Date    PREGTESTUR Negative 09/17/2020        ABGs: No results found for: PHART, PO2ART, PCO2ART, HCO3ART, BEART, O2SATART     Type & Screen (If Applicable):  No results found for: LABABO, LABRH    Drug/Infectious Status (If Applicable):  No results found for: HIV, HEPCAB    COVID-19 Screening (If Applicable): No results found for: COVID19        Anesthesia Evaluation  Patient summary reviewed no history of anesthetic complications:   Airway: Mallampati: III  TM distance: <3 FB   Neck ROM: full  Mouth opening: > = 3 FB  Dental: normal exam         Pulmonary:Negative Pulmonary ROS                              Cardiovascular:    (+) hypertension: no interval change and mild,                   Neuro/Psych:               GI/Hepatic/Renal:             Endo/Other:        (-) diabetes mellitus               Abdominal:             Vascular:          Other Findings:             Anesthesia Plan      general     ASA 2     (Interpretor used.)  Induction: intravenous.    MIPS: Postoperative opioids intended and Prophylactic antiemetics administered.                      Lanelle Bal, MD   09/17/2020

## 2020-09-17 NOTE — Progress Notes (Signed)
Patient/report received from OR to PACU in stable condition, vss, abdominal incisions x 3 c/d/i.  Abdomen soft, non-tender, oral airway in place, will monitor

## 2020-09-17 NOTE — Progress Notes (Signed)
Pt complaining on 9/10 pain in her pelvis/peri area and incision sites. Pt medicated accordingly. See MAR.

## 2020-09-17 NOTE — Progress Notes (Incomplete)
Pt states pain is feeling better and almost gone. Pt complaining of fascial numbness

## 2020-09-17 NOTE — Progress Notes (Signed)
Meets criteria for phase 1, seen by anesthesia, ok for phase 2

## 2020-09-17 NOTE — Anesthesia Post-Procedure Evaluation (Signed)
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       Anesthesia Post Evaluation    Patient location during evaluation: PACU  Patient participation: complete - patient participated  Level of consciousness: awake and alert  Pain score: 2  Airway patency: patent  Nausea & Vomiting: no vomiting  Complications: no  Cardiovascular status: blood pressure returned to baseline  Respiratory status: acceptable  Hydration status: euvolemic  Multimodal analgesia pain management approach

## 2020-09-17 NOTE — Discharge Instructions (Signed)
Follow-up in two weeks unless otherwise instructed    GYN DISCHARGE INSTRUCTIONS     Follow your surgeon's instructions.   Make a follow-up appointment to see your surgeon as directed.   Observe the operative area for signs of excessive bleeding. Call for vaginal bleeding heavier than a normal period.   Observe the operative area for signs of infection, such as increased pain, redness, fever greater than 101 degrees, swelling, foul odor, or yellow/green drainage. If present, contact your surgeon.   Take medication as directed. Take pain medication with food. Do not drive while taking narcotics.   Unless youare told otherwise, do not lift anything over 20 pounds or so until you see your surgeon for your follow-up appointment.    If you have problems urinating, notify your surgeon.   Take showers instead of baths the next few days.   No tampons, douching, or sexual intercourse unless advised by your surgeon at least until your follow-up appointment.   No heavy lifting, workouts or aerobics for 3-4 days.   What you can expect:   Drainage similar to period that gets lighter in flow and turns brown.   Some cramping for 24 hours   Bleeding intermittently for 2-3 weeks   Some soreness in legs and back   Heavier bleeding during next period for LEEP procedures   Call surgeon for any problems or questions.      SEDATION DISCHARGE INSTRUCTIONS   09/17/2020     Wear your seatbelt home.   You are under the influence of drugs. Do not drink alcohol, drive, operate machinery, or make any important decisions or sign any legal documents for 24 hours   A responsible adult needs to be with you for 24 hours.   You may experience lightheadedness, dizziness, nausea, heightened emotions and/or sleepiness following surgery.   Rest at home today- increase activity as tolerated.   Progress slowly to a regular diet and drink plenty of fluids unless your physician has instructed you otherwise.   If nausea becomes a  problem, call your physician.   Coughing, sore throat, and muscle aches are other side effects of anesthesia and should improve with time.   Do not drive or operate machinery while taking narcotics.   ATTENTION FEMALE PATIENTS: if you use an oral contraceptive or birth control pill, you need to use an additional or alternative method for pregnancy prevention for the next thirty days.  Medications given today may render your contraceptive ineffective for this cycle.

## 2020-09-17 NOTE — Op Note (Signed)
Department of Obstetrics and Gynecology   Brief Operative Report        Pre-operative Diagnosis:  Left ovarian mass    Post-operative Diagnosis:  Same    Procedure: Robotically assisted left salpingoophorectomy with removal of ovarian mass, pelvic washings    Surgeon:  Wyn Quaker, MD     Assistant(s): Harvie Heck    Anesthesia:  GETA    Findings: large 7 cm left ovarian mass c/w dermoid, Fallopian tubes with Filshie clips    Estimated blood loss:  min    Specimens: left ovary with mass and fallopian tube    Complications:  none    Condition:  stable        See dictated operative report for full details.

## 2020-09-17 NOTE — Progress Notes (Signed)
Teaching / education initiated regarding perioperative experience, expectations, and pain management during stay.  [employer] Interpreter service, Shon Hale at bedside assisting with translation. Patient verbalized understanding.

## 2020-09-17 NOTE — Progress Notes (Signed)
Patient required an interpreter, she answered all of my question and I answered all of hers and her families questions using interpreter Shon Hale form [employer].

## 2020-09-17 NOTE — Op Note (Signed)
[facility]                     [address] [place], [place] [address]                                OPERATIVE REPORT    PATIENT NAME: Faith Mason, Faith Mason                       DOB:        1976-06-07  MED REC NO:   [id number]                          ROOM:  ACCOUNT NO:   [id number]                           ADMIT DATE: 09/17/2020  PROVIDER:     Clarise Cruz, MD    DATE OF PROCEDURE:  09/17/2020    PREOPERATIVE DIAGNOSIS:  Left ovarian mass.    POSTOPERATIVE DIAGNOSIS:  Left ovarian mass.    OPERATION PERFORMED:  Robotically assisted left salpingo-oophorectomy  with removal of the ovarian mass, pelvic washing.    SURGEON:  Clarise Cruz, MD    ASSISTANTHarvie Heck.    ANESTHESIA:  General endotracheal.    COMPLICATIONS:  None.    ESTIMATED BLOOD LOSS:  Minimal.    FINDINGS:  Normal size uterus, normal right ovary, left ovary replaced  with large cyst with smooth capsule approximately 7 cm in diameter,  fallopian tubes with Filshie clips.    OPERATIVE PROCEDURE:  After informed consent was obtained, the patient  was taken to operating room where general anesthesia was performed  without difficulty.  She was prepared and draped in a normal sterile  fashion in dorsal lithotomy position.  A speculum was placed in the  vagina and a single-tooth tenaculum was placed on the anterior lip of  the cervix and cervix was serially dilated.  Uterus was gently sounded  to 7 cm.  HUMI manipulator was introduced in the uterus in order to  manipulate uterus.  Single-tooth tenaculum and speculum was removed.   Foley catheter was placed in the bladder and drained clear urine.   Attention was turned to the abdomen where after injecting 0.5% Marcaine  with epinephrine, a 5 mm infraumbilical skin incision was made with a  scalpel and Veress needle was introduced in the abdomen at 45-degree  angle while tenting up the abdominal wall.  Pneumoperitoneum was  obtained with CO2 gas and intraabdominal placement  was confirmed by the  drop in the intraabdominal pressure with insufflation of CO2 gas.   Veress needle was removed and 8 mm trocar was introduced in the abdomen  and intraabdominal placement was confirmed by the camera.  Two 8 mm skin  incisions were made on the left and right side approximately 10 cm away  from midline port and 8 mm trocars were introduced in the abdomen under  direct visualization.  At this point, pelvic washings were obtained and  sent to cytology.  The patient was placed in deep Trendelenburg position  and the robot was docked.  Surgeon scrubbed out and went to the console.  Survey of the pelvis revealed normal sized uterus with normal right  ovary, fallopian tubes with Filshie clips.  Large mass with smooth  capsule was in the posterior cul-de-sac consistent with left ovarian  mass.  At this time, the left infundibulopelvic ligament was identified,  grasped with bipolar instrument, cauterized and transected with good  hemostasis with monopolar scissors.  The left utero-ovarian ligament was  also cauterized and transected and the ovarian mass with fallopian tube  were removed and placed in the posterior cul-de-sac.  The pedicles were  inspected and found to be hemostatic.  The Endobag was introduced in the  abdomen and specimens were placed in the bag.  Additional specimen  contained the Filshie clip from the left tube also was removed and  placed in the bag.  Bag was removed from the abdomen after suction of  the cyst which was consistent with a dermoid cyst.  The pelvis was  inspected.  No spillage of the dermoid cyst was noted.  Intraabdominal  pressure was decreased down to 10.  Pedicles were inspected and no  bleeding was noted.  At this time, procedure was terminated.  All  instruments were removed.  Pneumoperitoneum was reduced.  The skin was  closed with 4-0 Monocryl.  HUMI manipulator was removed and no bleeding  was noted.  Foley catheter was removed as well.  The patient  tolerated  the procedure well.  Sponge, lap, and needle count was correct x2.  The  patient was taken to recovery room in stable condition.        Clarise Cruz, MD    D: 09/17/2020 12:44:06       T: 09/18/2020 0:33:31     [id number]  Job#: [id number]     Doc#: [id number]    CC:

## 2020-09-17 NOTE — Progress Notes (Signed)
Patient responding to verbal stimuli, oral airway removed, patient tolerated well

## 2020-09-17 NOTE — Progress Notes (Signed)
Vaginal Sweep Documentation       Vaginal sweep performed by DR Hal Neer*  at 900*. No foreign objects or vaginal tears noted.

## 2021-05-29 NOTE — Telephone Encounter (Signed)
Location of patient:[place]    Received call from [place] at [employer] with [employer].Needs to establish PCP     Subjective: Caller (Daughter) states H/A's and dizziness, "feels like head is about to burst"  Feels like getting electrocuted at times    Current Symptoms: H/A    Onset: 2 months, worsening    Associated Symptoms: NA    Pain Severity: 8/10 back and sides of head    Temperature: Denies     What has been tried: States Ibuprofen does not work     LMP:  Hyst   Pregnant: No    Recommended disposition: Go to ED/UCC Now (Or to Office with PCP Approval)    Care advice provided, patient verbalizes understanding; denies any other questions or concerns; instructed to call back for any new or worsening symptoms.    Outcome; Advised UCC/ER because she states does not have PCP  Caller states she thinks she called the wrong office and will call back if needed to establish with new PCP    Attention Provider:  Thank you for allowing me to participate in the care of your patient.  The patient was connected to triage in response to information provided to the ECC/PSC.  Please do not respond through this encounter as the response is not directed to a shared pool.       Reason for Disposition   SEVERE headache, states 'worst headache' of life    Protocols used: Headache-ADULT-[place]

## 2024-01-11 ENCOUNTER — Encounter

## 2024-01-11 ENCOUNTER — Ambulatory Visit
Admit: 2024-01-11 | Discharge: 2024-01-11 | Payer: Medicaid (Managed Care) | Attending: Student in an Organized Health Care Education/Training Program | Primary: Student in an Organized Health Care Education/Training Program

## 2024-01-11 DIAGNOSIS — M5412 Radiculopathy, cervical region: Principal | ICD-10-CM

## 2024-01-11 MED ORDER — ACETAMINOPHEN 500 MG PO TABS
500 | ORAL_TABLET | Freq: Four times a day (QID) | ORAL | 0 refills | 9.50000 days | Status: AC | PRN
Start: 2024-01-11 — End: 2024-05-23

## 2024-01-11 MED ORDER — METHOCARBAMOL 750 MG PO TABS
750 | ORAL_TABLET | Freq: Four times a day (QID) | ORAL | 0 refills | Status: AC
Start: 2024-01-11 — End: 2024-02-10

## 2024-01-11 MED ORDER — AMLODIPINE BESYLATE 5 MG PO TABS
5 | ORAL_TABLET | Freq: Every day | ORAL | 0 refills | 60.00000 days | Status: AC
Start: 2024-01-11 — End: 2024-05-23

## 2024-01-11 NOTE — Assessment & Plan Note (Deleted)
 Orders:    [facility] - Akbik, Humam, MD, Pain Management, [place]    methocarbamol  (ROBAXIN ) 750 MG tablet; Take 1 tablet by mouth 4 times daily    acetaminophen  (TYLENOL ) 500 MG tablet; Take 1 tablet by mouth 4 times daily as needed for Pain    MRI CERVICAL SPINE WO CONTRAST; Future

## 2024-01-11 NOTE — Progress Notes (Signed)
 01/11/2024    Faith Mason (DOB:  07-01-1975) is a 48 y.o. female, here to establish care as a new patient.     History of Present Illness  The patient presents to establish care and is accompanied by a Nurse, learning disability. She has been unwell for a significant period and was previously on medication, which she had to discontinue due to insurance issues. She experiences pain in her right arm and back, which extends to her left leg, causing discomfort during walking. The pain occasionally intensifies, leading to emergency room visits. Additionally, she reports a burning sensation in her left leg. She has undergone MRI scans and CT scans and has consulted with orthopedic and pain specialists. In December 2024, she received an injection for pain relief. She has not been following up with her OB/GYN since her surgery. She has previously undergone therapy for her neck and received injections, but her right arm continues to cause discomfort. She describes the sensation as more painful than numb, although she does experience numbness and tingling on the right side, particularly when sleeping on her right side. She had a mass removed from her uterus in 2020 and has not experienced any complications since then.    She has not taken her blood pressure medication for the past year. She has diabetes, but her blood sugar levels were not excessively high at the time of diagnosis. She was prescribed metformin  but is not currently taking it.    Her past surgical history includes the removal of a mass from her uterus in 2020.        Subjective   Patient Active Problem List   Diagnosis    Cervical radicular pain    Paresthesia and pain of both upper extremities    Cervical stenosis of spinal canal    Foraminal stenosis of cervical region    Adrenal mass, left    Primary hypertension    Type 2 diabetes mellitus with diabetic polyneuropathy, without long-term current use of insulin (HCC)       Review of Systems  As per HPI    Prior to Visit  Medications    Medication Sig Taking? Authorizing Provider   methocarbamol  (ROBAXIN ) 750 MG tablet Take 1 tablet by mouth 4 times daily Yes Tobie Ochs P, DO   acetaminophen  (TYLENOL ) 500 MG tablet Take 1 tablet by mouth 4 times daily as needed for Pain Yes Tobie Ochs P, DO   amLODIPine  (NORVASC ) 5 MG tablet Take 1 tablet by mouth daily Yes Tobie Ochs P, DO   ibuprofen  (ADVIL ;MOTRIN ) 600 MG tablet Take 1 tablet by mouth every 6 hours as needed for Pain  Patient not taking: Reported on 01/11/2024  Fennimore, Gladies LABOR, MD   lisinopril -hydroCHLOROthiazide  (PRINZIDE ;ZESTORETIC ) 10-12.5 MG per tablet Take 1 tablet by mouth daily  Patient not taking: Reported on 01/11/2024  Davit, Lynnie POUR, MD   blood glucose monitor strips Test bid  Patient not taking: Reported on 01/11/2024  Davit, Rajesh K, MD   blood glucose monitor strips Test 2 times a day & as needed for symptoms of irregular blood glucose. Ok to dispense what is covered by insurance  Patient not taking: Reported on 01/11/2024  Davit, Rajesh K, MD   Lancets MISC BID testing  Patient not taking: Reported on 01/11/2024  Davit, Rajesh K, MD   glucose monitoring kit (FREESTYLE) monitoring kit 1 each by Does not apply route once for 1 dose Glucometer:QD-BID testing. Ok to provide insurance covered device.  Davit, Rajesh K,  MD        No Known Allergies    Past Medical History:   Diagnosis Date    GERD (gastroesophageal reflux disease)     Hypertension 2016       Past Surgical History:   Procedure Laterality Date    BACK SURGERY  2017    L-spine    CERVICAL SPINE SURGERY N/A 08/15/2019    C7T1 INTERLAMINAR EPIDURAL STEROID INJECTION WITH FLUOROSCOPY performed by Nancyann JINNY Show, MD at [facility]    PAIN MANAGEMENT PROCEDURE N/A 10/10/2019    C7T1 INTERLAMINAR EPIDURAL STEROID INJECTION WITH FLUOROSCOPY performed by Nancyann JINNY Show, MD at [facility]    SALPINGO-OOPHORECTOMY Left 09/17/2020    ROBOTIC ASSISTED LEFT SALPINGO OOPHORECTOMY WITH CYSTECTOMY AND PELVIC WASHINGS (41338,  41337) performed by Gladies DELENA Halim, MD at [facility] OR       Social History     Socioeconomic History    Marital status: Married     Spouse name: Not on file    Number of children: Not on file    Years of education: Not on file    Highest education level: Not on file   Occupational History    Not on file   Tobacco Use    Smoking status: Never    Smokeless tobacco: Never   Vaping Use    Vaping status: Never Used   Substance and Sexual Activity    Alcohol use: Never    Drug use: Never    Sexual activity: Yes     Partners: Male     Comment: married-4 children    Other Topics Concern    Not on file   Social History Narrative    Not on file     Social Drivers of Health     Financial Resource Strain: Not on file   Food Insecurity: No Food Insecurity (01/11/2024)    Hunger Vital Sign     Worried About Running Out of Food in the Last Year: Never true     Ran Out of Food in the Last Year: Never true   Transportation Needs: No Transportation Needs (01/11/2024)    PRAPARE - Therapist, art (Medical): No     Lack of Transportation (Non-Medical): No   Physical Activity: Not on file   Stress: Not on file   Social Connections: Not on file   Intimate Partner Violence: Not on file   Housing Stability: Low Risk  (01/11/2024)    Housing Stability Vital Sign     Unable to Pay for Housing in the Last Year: No     Number of Times Moved in the Last Year: 0     Homeless in the Last Year: No        Family History   Problem Relation Age of Onset    High Blood Pressure Mother     Diabetes Mother     High Blood Pressure Father     Diabetes Father        ADVANCE DIRECTIVE: N, <no information>    Vitals:    01/11/24 0959 01/11/24 1027   BP: (!) 158/92 (!) 140/94   BP Site: Left Upper Arm Left Upper Arm   Patient Position: Sitting Sitting   BP Cuff Size: Medium Adult Medium Adult   Pulse: 58    SpO2: 99%    Weight: 63.6 kg (140 lb 3.2 oz)    Height: 1.499 m (4' 11)  Estimated body mass index is 28.32 kg/m as calculated  from the following:    Height as of this encounter: 1.499 m (4' 11).    Weight as of this encounter: 63.6 kg (140 lb 3.2 oz).       Objective   Physical Exam  Constitutional:       General: She is not in acute distress.     Appearance: Normal appearance. She is not ill-appearing.   HENT:      Head: Normocephalic and atraumatic.   Cardiovascular:      Rate and Rhythm: Normal rate and regular rhythm.      Pulses: Normal pulses.      Heart sounds: Normal heart sounds. No murmur heard.     No friction rub. No gallop.   Pulmonary:      Effort: Pulmonary effort is normal. No respiratory distress.      Breath sounds: Normal breath sounds. No stridor. No wheezing, rhonchi or rales.   Abdominal:      General: Abdomen is flat. Bowel sounds are normal. There is no distension.      Palpations: Abdomen is soft.      Tenderness: There is no abdominal tenderness. There is no guarding.   Musculoskeletal:         General: No swelling, tenderness or deformity. Normal range of motion.      Cervical back: No rigidity or tenderness.      Right lower leg: No edema.      Left lower leg: No edema.   Skin:     General: Skin is warm and dry.      Capillary Refill: Capillary refill takes less than 2 seconds.      Coloration: Skin is not jaundiced.      Findings: No rash.   Neurological:      General: No focal deficit present.      Mental Status: She is alert and oriented to person, place, and time. Mental status is at baseline.      Sensory: Sensory deficit present.      Motor: No weakness.      Coordination: Coordination normal.      Gait: Gait normal.      Comments: Chronic paresthesia of UE's      Psychiatric:         Mood and Affect: Mood normal.         Behavior: Behavior normal.             Latest Ref Rng & Units 09/11/2020     2:02 PM 10/10/2019     1:28 PM 09/21/2019     4:30 PM   LAB PRIMARY [facility]   A1C See comment %   6.5    A1C POC See comment %   6.5    GLU random 70 - 99 mg/dL 896   898    GLU POC 70 - 99 mg/dl  892     CHOL 0 - 800  mg/dL   788    TRIG 0 - 849 mg/dL   787    HDL 40 - 60 mg/dL   41    LDL CALC <899 mg/dL   871    NA 863 - 854 mmol/L 141   141    K 3.5 - 5.1 mmol/L 4.6   4.5    BUN 7 - 20 mg/dL 12   9    CR 0.6 - 1.1 mg/dL 0.7   0.6    CA  8.3 - 10.6 mg/dL 9.8   9.2    ALT 10 - 40 U/L   13    AST 15 - 37 U/L   16    HGB 12.0 - 16.0 g/dL 86.7          Lab Results   Component Value Date/Time    CHOL 211 09/21/2019 04:30 PM    CHOL 181 08/18/2018 03:14 PM    CHOL 225 04/11/2018 11:00 AM    TRIG 212 09/21/2019 04:30 PM    TRIG 139 08/18/2018 03:14 PM    TRIG 172 04/11/2018 11:00 AM    HDL 41 09/21/2019 04:30 PM    HDL 44 08/18/2018 03:14 PM    HDL 50 04/11/2018 11:00 AM    GLUCOSE 103 09/11/2020 02:02 PM    LABA1C 6.5 09/21/2019 04:30 PM    LABA1C 6.3 08/18/2018 03:14 PM    LABA1C 6.7 05/12/2018 02:12 PM       The 10-year ASCVD risk score (Arnett DK, et al., 2019) is: 4.4%    Values used to calculate the score:      Age: 18 years      Sex: Female      Is Non-Hispanic African American: No      Diabetic: Yes      Tobacco smoker: No      Systolic Blood Pressure: 140 mmHg      Is BP treated: No      HDL Cholesterol: 41 mg/dL      Total Cholesterol: 211 mg/dL      There is no immunization history on file for this patient.    Health Maintenance   Topic Date Due    Diabetic foot exam  Never done    Depression Screen  Never done    HIV screen  Never done    Diabetic retinal exam  Never done    Hepatitis C screen  Never done    Hepatitis B vaccine (1 of 3 - 19+ 3-dose series) Never done    DTaP/Tdap/Td vaccine (1 - Tdap) Never done    Colorectal Cancer Screen  Never done    A1C test (Diabetic or Prediabetic)  12/21/2019    Diabetic Alb to Cr ratio (uACR) test  09/20/2020    Breast cancer screen  08/27/2021    GFR test (Diabetes, CKD 3-4, OR last GFR 15-59)  09/11/2021    COVID-19 Vaccine (1 - 2024-25 season) Never done    Cervical cancer screen  04/27/2023    Shingles vaccine (1 of 2) Never done    Pneumococcal 50+ years Vaccine (1 of 1 - PCV)  Never done    Flu vaccine (1) 01/20/2024    Lipids  09/20/2024    Hepatitis A vaccine  Aged Out    Hib vaccine  Aged Out    Polio vaccine  Aged Out    Meningococcal (ACWY) vaccine  Aged Out    Meningococcal B vaccine  Aged Out             Diagnoses and all orders for this visit:  Cervical radicular pain  -     AFL - Akbik, Humam, MD, Pain Management, [place]  -     methocarbamol  (ROBAXIN ) 750 MG tablet; Take 1 tablet by mouth 4 times daily  -     acetaminophen  (TYLENOL ) 500 MG tablet; Take 1 tablet by mouth 4 times daily as needed for Pain  -     MRI CERVICAL SPINE WO CONTRAST; Future  Paresthesia and pain of both upper extremities  -     AFL - Akbik, Humam, MD, Pain Management, [place]  -     methocarbamol  (ROBAXIN ) 750 MG tablet; Take 1 tablet by mouth 4 times daily  -     acetaminophen  (TYLENOL ) 500 MG tablet; Take 1 tablet by mouth 4 times daily as needed for Pain  -     MRI CERVICAL SPINE WO CONTRAST; Future  Cervical stenosis of spinal canal  -     AFL - Akbik, Humam, MD, Pain Management, [place]  -     methocarbamol  (ROBAXIN ) 750 MG tablet; Take 1 tablet by mouth 4 times daily  -     acetaminophen  (TYLENOL ) 500 MG tablet; Take 1 tablet by mouth 4 times daily as needed for Pain  -     MRI CERVICAL SPINE WO CONTRAST; Future  Primary hypertension  -     amLODIPine  (NORVASC ) 5 MG tablet; Take 1 tablet by mouth daily  Annual physical exam  -     Basic Metabolic Panel; Future  -     CBC with Auto Differential; Future  -     Hepatic Function Panel; Future  -     Hemoglobin A1C; Future  -     Lipid Panel; Future  -     Albumin/Creatinine Ratio, Urine; Future  Type 2 diabetes mellitus with diabetic polyneuropathy, without long-term current use of insulin (HCC)       Assessment & Plan  Right arm pain  - Reports pain in the right arm, exacerbated by certain movements  - MRI of the cervical spine will be ordered to investigate the cause of symptoms  - Referral to a pain management specialist will  be made  - Muscle relaxers and Tylenol  prescribed for pain management  - Advised not to operate machinery or drive while on muscle relaxers due to potential drowsiness    Back pain with left leg pain and burning  - Experiences back pain radiating to the left leg, causing burning sensations and difficulty walking  - MRI of the cervical spine will be ordered to investigate the cause of symptoms  - Referral to a pain management specialist will be made  - Muscle relaxers and Tylenol  prescribed for pain management  - Advised not to operate machinery or drive while on muscle relaxers due to potential drowsiness    Hypertension  - Blood pressure is elevated, likely due to discontinuation of previous medication  - Amlodipine  prescribed to manage hypertension  - Lab work will be ordered    Diabetes mellitus  - A1c level was 6.5 in 2021; not currently on any diabetes medication  - A1c level will be checked  - Based on results, a decision will be made regarding initiation of metformin  or a stronger medication          Return in about 4 weeks (around 02/08/2024).                     This dictation was generated by voice recognition computer software.  Although all attempts are made to edit the dictation for accuracy, there may be errors in the transcription that are not intended.    An electronic signature was used to authenticate this note.    --Margretta SHAUNNA Blanch, DO

## 2024-01-11 NOTE — Assessment & Plan Note (Deleted)
 Orders:    amLODIPine (NORVASC) 5 MG tablet; Take 1 tablet by mouth daily

## 2024-01-11 NOTE — Consults (Signed)
 Session ID: [id number]  Session Duration: Longer than 54 minutes  Language: Nepali  Interpreter ID: #[id number]  Interpreter Name: Eletha: Nepali  Interpreter ID: [id number]  Interpreter Name: Marcus

## 2024-01-12 ENCOUNTER — Encounter

## 2024-01-12 LAB — CBC WITH AUTO DIFFERENTIAL
Basophils %: 0.4 %
Basophils Absolute: 0 K/uL (ref 0.0–0.2)
Eosinophils %: 1 %
Eosinophils Absolute: 0.1 K/uL (ref 0.0–0.6)
Hematocrit: 39.8 % (ref 36.0–48.0)
Hemoglobin: 13.6 g/dL (ref 12.0–16.0)
Lymphocytes %: 42.7 %
Lymphocytes Absolute: 3.5 K/uL (ref 1.0–5.1)
MCH: 28.7 pg (ref 26.0–34.0)
MCHC: 34.2 g/dL (ref 31.0–36.0)
MCV: 83.9 fL (ref 80.0–100.0)
MPV: 9 fL (ref 5.0–10.5)
Monocytes %: 4.9 %
Monocytes Absolute: 0.4 K/uL (ref 0.0–1.3)
Neutrophils %: 51 %
Neutrophils Absolute: 4.2 K/uL (ref 1.7–7.7)
Platelets: 263 K/uL (ref 135–450)
RBC: 4.74 M/uL (ref 4.00–5.20)
RDW: 13.4 % (ref 12.4–15.4)
WBC: 8.2 K/uL (ref 4.0–11.0)

## 2024-01-12 LAB — BASIC METABOLIC PANEL
Anion Gap: 11 (ref 3–16)
BUN: 11 mg/dL (ref 7–20)
CO2: 28 mmol/L (ref 21–32)
Calcium: 9.5 mg/dL (ref 8.3–10.6)
Chloride: 102 mmol/L (ref 99–110)
Creatinine: 0.8 mg/dL (ref 0.6–1.1)
Est, Glom Filt Rate: 89 (ref >60)
Glucose: 161 mg/dL — ABNORMAL HIGH (ref 70–99)
Potassium: 5.3 mmol/L — ABNORMAL HIGH (ref 3.5–5.1)
Sodium: 141 mmol/L (ref 136–145)

## 2024-01-12 LAB — HEPATIC FUNCTION PANEL
ALT: 18 U/L (ref 10–40)
AST: 17 U/L (ref 15–37)
Albumin: 4.4 g/dL (ref 3.4–5.0)
Alkaline Phosphatase: 101 U/L (ref 40–129)
Bilirubin, Direct: 0.2 mg/dL (ref 0.0–0.3)
Bilirubin, Indirect: 0.3 mg/dL (ref 0.0–1.0)
Total Bilirubin: 0.5 mg/dL (ref 0.0–1.0)
Total Protein: 7.5 g/dL (ref 6.4–8.2)

## 2024-01-12 LAB — LIPID PANEL
Cholesterol, Total: 229 mg/dL — ABNORMAL HIGH (ref 0–199)
HDL: 50 mg/dL (ref 40–60)
LDL Cholesterol: 141 mg/dL — ABNORMAL HIGH (ref <100)
Triglycerides: 191 mg/dL — ABNORMAL HIGH (ref 0–150)
VLDL Cholesterol Calculated: 38 mg/dL

## 2024-01-12 LAB — ALBUMIN/CREATININE RATIO, URINE
Albumin Urine: <1.2 mg/dL (ref <2.0)
Creatinine, Ur: 28 mg/dL (ref 28.0–259.0)

## 2024-01-12 LAB — HEMOGLOBIN A1C
Estimated Avg Glucose: 157.1 mg/dL
Hemoglobin A1C: 7.1 %

## 2024-01-13 MED ORDER — METFORMIN HCL 1000 MG PO TABS
1000 | ORAL_TABLET | Freq: Every day | ORAL | 0 refills | 90.00000 days | Status: DC
Start: 2024-01-13 — End: 2024-05-23

## 2024-01-13 MED ORDER — ATORVASTATIN CALCIUM 40 MG PO TABS
40 | ORAL_TABLET | Freq: Every day | ORAL | 0 refills | 30.00000 days | Status: AC
Start: 2024-01-13 — End: ?

## 2024-01-16 NOTE — Addendum Note (Signed)
 Addended by: Elija Mccamish P on: 01/16/2024 10:32 AM     Modules accepted: Level of Service

## 2024-01-25 ENCOUNTER — Encounter

## 2024-01-26 LAB — BASIC METABOLIC PANEL
Anion Gap: 11 (ref 3–16)
BUN: 11 mg/dL (ref 7–20)
CO2: 26 mmol/L (ref 21–32)
Calcium: 9.2 mg/dL (ref 8.3–10.6)
Chloride: 104 mmol/L (ref 99–110)
Creatinine: 0.8 mg/dL (ref 0.6–1.1)
Est, Glom Filt Rate: 89 (ref >60)
Glucose: 133 mg/dL — ABNORMAL HIGH (ref 70–99)
Potassium: 4.5 mmol/L (ref 3.5–5.1)
Sodium: 141 mmol/L (ref 136–145)

## 2024-02-03 ENCOUNTER — Inpatient Hospital Stay
Admit: 2024-02-03 | Payer: Medicaid (Managed Care) | Attending: Student in an Organized Health Care Education/Training Program | Primary: Student in an Organized Health Care Education/Training Program

## 2024-02-03 DIAGNOSIS — M5412 Radiculopathy, cervical region: Principal | ICD-10-CM

## 2024-02-03 NOTE — Progress Notes (Signed)
 Prior to taking the patient into Zone IV, the patients' entire body was screened using the Ferromagnetic Detection System Chief Executive Officer).

## 2024-02-08 ENCOUNTER — Ambulatory Visit
Admit: 2024-02-08 | Discharge: 2024-02-08 | Payer: Medicaid (Managed Care) | Attending: Student in an Organized Health Care Education/Training Program | Primary: Student in an Organized Health Care Education/Training Program

## 2024-02-08 VITALS — BP 124/82 | HR 59 | Temp 97.70000°F | Ht 59.0 in | Wt 142.2 lb

## 2024-02-08 DIAGNOSIS — M4802 Spinal stenosis, cervical region: Principal | ICD-10-CM

## 2024-02-08 NOTE — Consults (Signed)
 Session ID: [id number]  Session Duration: 30 minutes  Language: Nepali  Interpreter ID: #[id number]  Interpreter Name: Sven

## 2024-02-08 NOTE — Progress Notes (Signed)
 Faith Mason (DOB:  1975-09-02) is a 48 y.o. female,Established patient, here for evaluation of the following chief complaint(s):  Other (Bp follow up, )      History of Present Illness  The patient presents for evaluation of hypertension, diabetes, and back pain. She is accompanied by an interpreter. She reports no adverse effects from her current medication regimen. She has undergone an MRI scan for her arm and back pain, but the results are yet to be interpreted by a radiologist. She has scheduled an appointment with an orthopedist on 03/20/2024. She monitors her blood sugar levels at home, with a recent reading of 135. She does not have an ophthalmologist for her diabetes exam. Her last mammogram was conducted in 2021.      Assessment & Plan   ASSESSMENT/PLAN:  Diagnoses and all orders for this visit:  Cervical stenosis of spinal canal  Primary hypertension  Type 2 diabetes mellitus with diabetic polyneuropathy, without long-term current use of insulin (HCC)  -     HM DIABETES FOOT EXAM  -     AFL - Etter Sawyer, MD, Ophthalmology (Cataract, Comprehensive, Glaucoma, Diabetic Eye Care), [place]  Language barrier affecting health care  Screening mammogram for breast cancer  -     MAM TOMO DIGITAL SCREEN BILATERAL; Future      Assessment & Plan  Hypertension:  - Blood pressure readings are within the normal range today.  - Continue with amlodipine  as prescribed.  Tolerating well without any side effects.    Back pain:  - MRI results are pending.  - Orthopedist follow up scheduled soon, we will follow-up with her plan.  - Patient will be notified once MRI results are available.    Diabetes mellitus:  - Has not been checking her blood sugars daily, however most recent blood sugar level recorded at 135.  - Continue current regimen for now.  - Foot examination conducted today.  - Referral to ophthalmologist for diabetes-related eye exam.    Foot EXAM:  Visual inspection:  Deformity/amputation: absent  Skin  lesions/pre-ulcerative calluses: absent  Edema: right- negative, left- negative    Sensory exam:  Monofilament sensation: normal  (minimum of 5 random plantar locations tested, avoiding callused areas - > 1 area with absence of sensation is + for neuropathy)    Plus at least one of the following:  Pulses: normal,   Pinprick: Intact  Proprioception: Intact        Health maintenance:  - Potassium levels have shown improvement.  - Mammogram has been ordered.    Follow-up:  - Patient will follow up in 3 months.      Return in about 3 months (around 05/10/2024).         Subjective   SUBJECTIVE/OBJECTIVE:    Lab Review   Lab Results   Component Value Date/Time    NA 141 01/25/2024 12:44 PM    NA 141 01/11/2024 10:33 AM    NA 141 09/11/2020 02:02 PM    K 4.5 01/25/2024 12:44 PM    K 5.3 01/11/2024 10:33 AM    K 4.6 09/11/2020 02:02 PM    CO2 26 01/25/2024 12:44 PM    CO2 28 01/11/2024 10:33 AM    CO2 26 09/11/2020 02:02 PM    BUN 11 01/25/2024 12:44 PM    BUN 11 01/11/2024 10:33 AM    BUN 12 09/11/2020 02:02 PM    CREATININE 0.8 01/25/2024 12:44 PM    CREATININE 0.8 01/11/2024 10:33 AM  CREATININE 0.7 09/11/2020 02:02 PM    GLUCOSE 133 01/25/2024 12:44 PM    GLUCOSE 161 01/11/2024 10:33 AM    GLUCOSE 103 09/11/2020 02:02 PM    CALCIUM  9.2 01/25/2024 12:44 PM    CALCIUM  9.5 01/11/2024 10:33 AM    CALCIUM  9.8 09/11/2020 02:02 PM     Lab Results   Component Value Date/Time    WBC 8.2 01/11/2024 10:33 AM    WBC 7.8 09/11/2020 02:02 PM    WBC 10.8 05/12/2018 02:12 PM    HGB 13.6 01/11/2024 10:33 AM    HGB 13.2 09/11/2020 02:02 PM    HGB 13.1 05/12/2018 02:12 PM    HCT 39.8 01/11/2024 10:33 AM    HCT 39.3 09/11/2020 02:02 PM    HCT 38.8 05/12/2018 02:12 PM    MCV 83.9 01/11/2024 10:33 AM    MCV 85.4 09/11/2020 02:02 PM    MCV 85.4 05/12/2018 02:12 PM    PLT 263 01/11/2024 10:33 AM    PLT 209 09/11/2020 02:02 PM    PLT 285 05/12/2018 02:12 PM     Lab Results   Component Value Date/Time    CHOL 229 01/11/2024 10:33 AM    CHOL  211 09/21/2019 04:30 PM    CHOL 181 08/18/2018 03:14 PM    TRIG 191 01/11/2024 10:33 AM    TRIG 212 09/21/2019 04:30 PM    TRIG 139 08/18/2018 03:14 PM    HDL 50 01/11/2024 10:33 AM    HDL 41 09/21/2019 04:30 PM    HDL 44 08/18/2018 03:14 PM       Results  Labs   - Blood glucose test: 135 mg/dL   - Potassium level: Improved          02/08/2024     1:22 PM 01/11/2024    10:27 AM 01/11/2024     9:59 AM   Vitals   SYSTOLIC 124 140 841   DIASTOLIC 82 94 92   BP Site  Left Upper Arm Left Upper Arm   Patient Position  Sitting Sitting   BP Cuff Size  Medium Adult Medium Adult   Pulse 59  58   Temp 97.7 F (36.5 C)     SpO2 99 %  99 %   Weight - Scale 142 lb 3.2 oz  140 lb 3.2 oz   Height 4' 11  4' 11   Body Mass Index 28.72 kg/m2  28.32 kg/m2         Review of Systems   As per HPI    Objective   Physical Exam  Constitutional:       General: She is not in acute distress.     Appearance: Normal appearance. She is not ill-appearing.   HENT:      Head: Normocephalic and atraumatic.   Cardiovascular:      Rate and Rhythm: Normal rate and regular rhythm.      Pulses: Normal pulses.      Heart sounds: Normal heart sounds. No murmur heard.     No friction rub. No gallop.   Pulmonary:      Effort: Pulmonary effort is normal. No respiratory distress.      Breath sounds: Normal breath sounds. No stridor. No wheezing, rhonchi or rales.   Abdominal:      General: Abdomen is flat. Bowel sounds are normal. There is no distension.      Palpations: Abdomen is soft.      Tenderness: There is no abdominal tenderness. There  is no guarding.   Musculoskeletal:         General: Normal range of motion.      Cervical back: No rigidity or tenderness.      Right lower leg: No edema.      Left lower leg: No edema.   Skin:     General: Skin is warm and dry.      Capillary Refill: Capillary refill takes less than 2 seconds.      Coloration: Skin is not jaundiced.      Findings: No rash.   Neurological:      General: No focal deficit present.       Mental Status: She is alert and oriented to person, place, and time. Mental status is at baseline.   Psychiatric:         Mood and Affect: Mood normal.         Behavior: Behavior normal.              This dictation was generated by voice recognition computer software.  Although all attempts are made to edit the dictation for accuracy, there may be errors in the transcription that are not intended.    An electronic signature was used to authenticate this note.    --Margretta SHAUNNA Blanch, DO

## 2024-02-15 ENCOUNTER — Inpatient Hospital Stay
Admit: 2024-02-15 | Payer: Medicaid (Managed Care) | Attending: Student in an Organized Health Care Education/Training Program | Primary: Student in an Organized Health Care Education/Training Program

## 2024-02-15 DIAGNOSIS — Z1231 Encounter for screening mammogram for malignant neoplasm of breast: Principal | ICD-10-CM

## 2024-02-15 NOTE — Consults (Signed)
 Session ID: [id number]  Session Duration: 5 minutes  Language: Nepali  Interpreter ID: #[id number]  Interpreter Name: Erma

## 2024-03-06 ENCOUNTER — Ambulatory Visit: Payer: Medicaid (Managed Care) | Primary: Student in an Organized Health Care Education/Training Program

## 2024-05-14 ENCOUNTER — Encounter
Payer: Medicaid (Managed Care) | Attending: Student in an Organized Health Care Education/Training Program | Primary: Student in an Organized Health Care Education/Training Program

## 2024-05-23 ENCOUNTER — Ambulatory Visit
Admit: 2024-05-23 | Discharge: 2024-05-23 | Payer: Medicaid (Managed Care) | Attending: Student in an Organized Health Care Education/Training Program | Primary: Student in an Organized Health Care Education/Training Program

## 2024-05-23 ENCOUNTER — Encounter

## 2024-05-23 VITALS — BP 126/82 | HR 62 | Temp 97.50000°F | Ht 59.0 in | Wt 138.6 lb

## 2024-05-23 DIAGNOSIS — E1142 Type 2 diabetes mellitus with diabetic polyneuropathy: Principal | ICD-10-CM

## 2024-05-23 LAB — POCT GLYCOSYLATED HEMOGLOBIN (HGB A1C): Hemoglobin A1C: 7.3 %

## 2024-05-23 MED ORDER — METFORMIN HCL 1000 MG PO TABS
1000 | ORAL_TABLET | Freq: Two times a day (BID) | ORAL | 0 refills | 90.00000 days | Status: AC
Start: 2024-05-23 — End: 2024-08-21

## 2024-05-23 NOTE — Consults (Signed)
 Session ID: [id number]  Session Duration: 18 minutes  Language: Nepali  Interpreter ID: #[id number]  Interpreter Name: Davee

## 2024-05-23 NOTE — Progress Notes (Addendum)
"  PATIENT REACHED   YES__X__NO____    PREOP INSTRUCTIONS LEFT ON VM NUMBER_______________      DATE___12/9/25______ TIME_0815________ARRIVAL__0715______PLACE__[address]__________  NOTHING TO EAT OR DRINK  AFTER MIDNIGHT THE EVENING PRIOR OR AS INSTRUCTED BY YOUR DR.  ROSINE NEED A RESPONSIBLE ADULT AGE 50 OR OLDER TO DRIVE YOU HOME  PLEASE BRING INSURANCE CARD.PICTURE ID AND COMPLETE LIST OF MEDS  WEAR LOOSE COMFORTABLE CLOTHING  FOLLOW ANY INSTRUCTIONS YOUR DRS OFFICE HAS GIVEN YOU,INCLUDING WHAT MEDICATIONS TO TAKE THE AM OF PROCEDURE AND WHEN AND IF YOU NEED TO STOP ANY BLOOD THINNERS. IF YOU HAVE QUESTIONS REGARDING THIS CALL THE OFFICE  THE GOAL BLOOD SUGAR THE AM OF PROCEDURE IS 200 OR LESS ABOVE THAT THE PROCEDURE MAY BE CANCELLED  ANY QUESTIONS CALL YOUR DOCTOR.ALSO,PLEASE READ THE INSTRUCTION PACKET FROM YOUR DR IF YOU RECEIVED ONE.  SPINE INTERVENTION NUMBER IS [phone]      OTHER____spoke with daughter Shelvy and instructions given.. will order nepali interpreter for this visit, _accuracy now job#[id number]______________________________      VISITOR POLICY(subject to change)    Current policy is 2 visitors per patient. No children. Masks are required.    "

## 2024-05-23 NOTE — Progress Notes (Signed)
 "  Faith Mason (DOB:  [birth year]) is a 48 y.o. female,Established patient, here for evaluation of the following chief complaint(s):  Diabetes (3 mo follow up, discuss mri fro August)      History of Present Illness  The patient is a 48 year old female who presents for follow-up of diabetes and to review MRI results. She is accompanied by an interpreter.    She has been monitoring her blood glucose levels at home, which have been elevated. She is currently on a regimen of metformin , administered once daily.    She has an upcoming appointment with Dr. Allene, a pain management specialist, scheduled for the following Tuesday. She reports experiencing pain in her hand, accompanied by numbness in her fingers and palm.      Assessment & Plan   ASSESSMENT/PLAN:  Diagnoses and all orders for this visit:  Type 2 diabetes mellitus with diabetic polyneuropathy, without long-term current use of insulin (HCC)  -     POCT glycosylated hemoglobin (Hb A1C)  -     metFORMIN  (GLUCOPHAGE ) 1000 MG tablet; Take 1 tablet by mouth 2 times daily (with meals)  -     Basic Metabolic Panel; Future  -     CBC with Auto Differential; Future  -     Hemoglobin A1C; Future  -     Hepatic Function Panel; Future  -     Lipid Panel; Future  -     Albumin/Creatinine Ratio, Urine; Future      Assessment & Plan  Diabetes mellitus:  - A1c levels increased from 7.1 to 7.3 since last evaluation  - Laboratory tests to be conducted today to monitor condition  - Increase metformin  dosage to 1000 mg, taken twice daily with meals  - Advise to limit intake of high-sugar foods (rice, pasta, potatoes, bread)    Cervical spine stenosis:  - MRI revealed narrowing causing nerve impingement, contributing to hand pain and numbness  - Appointment with Dr. Allene for steroid injection on Tuesday  - Steroid injection expected to help with nerve pain and inflammation  - Advise follow-up with pain management after injection    Follow-up:  - Patient to follow up in 3  months      Return in about 3 months (around 08/21/2024).         Subjective   SUBJECTIVE/OBJECTIVE:    Lab Review   Lab Results   Component Value Date/Time    NA 141 01/25/2024 12:44 PM    NA 141 01/11/2024 10:33 AM    NA 141 09/11/2020 02:02 PM    K 4.5 01/25/2024 12:44 PM    K 5.3 01/11/2024 10:33 AM    K 4.6 09/11/2020 02:02 PM    CO2 26 01/25/2024 12:44 PM    CO2 28 01/11/2024 10:33 AM    CO2 26 09/11/2020 02:02 PM    BUN 11 01/25/2024 12:44 PM    BUN 11 01/11/2024 10:33 AM    BUN 12 09/11/2020 02:02 PM    CREATININE 0.8 01/25/2024 12:44 PM    CREATININE 0.8 01/11/2024 10:33 AM    CREATININE 0.7 09/11/2020 02:02 PM    GLUCOSE 133 01/25/2024 12:44 PM    GLUCOSE 161 01/11/2024 10:33 AM    GLUCOSE 103 09/11/2020 02:02 PM    CALCIUM  9.2 01/25/2024 12:44 PM    CALCIUM  9.5 01/11/2024 10:33 AM    CALCIUM  9.8 09/11/2020 02:02 PM     Lab Results   Component Value Date/Time    WBC  8.2 01/11/2024 10:33 AM    WBC 7.8 09/11/2020 02:02 PM    WBC 10.8 05/12/2018 02:12 PM    HGB 13.6 01/11/2024 10:33 AM    HGB 13.2 09/11/2020 02:02 PM    HGB 13.1 05/12/2018 02:12 PM    HCT 39.8 01/11/2024 10:33 AM    HCT 39.3 09/11/2020 02:02 PM    HCT 38.8 05/12/2018 02:12 PM    MCV 83.9 01/11/2024 10:33 AM    MCV 85.4 09/11/2020 02:02 PM    MCV 85.4 05/12/2018 02:12 PM    PLT 263 01/11/2024 10:33 AM    PLT 209 09/11/2020 02:02 PM    PLT 285 05/12/2018 02:12 PM     Lab Results   Component Value Date/Time    CHOL 229 01/11/2024 10:33 AM    CHOL 211 09/21/2019 04:30 PM    CHOL 181 08/18/2018 03:14 PM    TRIG 191 01/11/2024 10:33 AM    TRIG 212 09/21/2019 04:30 PM    TRIG 139 08/18/2018 03:14 PM    HDL 50 01/11/2024 10:33 AM    HDL 41 09/21/2019 04:30 PM    HDL 44 08/18/2018 03:14 PM       Results  Labs   - A1c: Increased from 7.1 to 7.3    Imaging   - MRI of the cervical spine: Showed some narrowing of the cervical spine          05/23/2024     1:03 PM 02/15/2024     1:07 PM 02/08/2024     1:22 PM   Vitals   SYSTOLIC 126  124   DIASTOLIC 82  82    Pulse 62  59   Temp 97.5 F (36.4 C)  97.7 F (36.5 C)   SpO2 98 %  99 %   Weight - Scale 138 lb 9.6 oz 142 lb 142 lb 3.2 oz   Height 4' 11 4' 11 4' 11   Body Mass Index 27.99 kg/m2 28.68 kg/m2 28.72 kg/m2         Review of Systems       Objective   Physical Exam  Constitutional:       General: She is not in acute distress.     Appearance: Normal appearance. She is not ill-appearing.   HENT:      Head: Normocephalic and atraumatic.   Cardiovascular:      Rate and Rhythm: Normal rate and regular rhythm.      Pulses: Normal pulses.      Heart sounds: Normal heart sounds. No murmur heard.     No friction rub. No gallop.   Pulmonary:      Effort: Pulmonary effort is normal. No respiratory distress.      Breath sounds: Normal breath sounds. No stridor. No wheezing, rhonchi or rales.   Abdominal:      General: Abdomen is flat. Bowel sounds are normal. There is no distension.      Palpations: Abdomen is soft.      Tenderness: There is no abdominal tenderness. There is no guarding.   Musculoskeletal:         General: Normal range of motion.      Cervical back: No rigidity or tenderness.      Right lower leg: No edema.      Left lower leg: No edema.   Skin:     General: Skin is warm and dry.      Capillary Refill: Capillary refill takes less than 2 seconds.  Coloration: Skin is not jaundiced.      Findings: No rash.   Neurological:      General: No focal deficit present.      Mental Status: She is alert and oriented to person, place, and time. Mental status is at baseline.   Psychiatric:         Mood and Affect: Mood normal.         Behavior: Behavior normal.              This dictation was generated by voice recognition computer software.  Although all attempts are made to edit the dictation for accuracy, there may be errors in the transcription that are not intended.    An electronic signature was used to authenticate this note.    --Margretta SHAUNNA Blanch, DO   "

## 2024-05-24 LAB — CBC WITH AUTO DIFFERENTIAL
Basophils %: 0.6 %
Basophils Absolute: 0.1 K/uL (ref 0.0–0.2)
Eosinophils %: 0.7 %
Eosinophils Absolute: 0.1 K/uL (ref 0.0–0.6)
Hematocrit: 40.3 % (ref 36.0–48.0)
Hemoglobin: 13.3 g/dL (ref 12.0–16.0)
Lymphocytes %: 37.2 %
Lymphocytes Absolute: 3.4 K/uL (ref 1.0–5.1)
MCH: 28.1 pg (ref 26.0–34.0)
MCHC: 32.9 g/dL (ref 31.0–36.0)
MCV: 85.4 fL (ref 80.0–100.0)
MPV: 10 fL (ref 5.0–10.5)
Monocytes %: 4.9 %
Monocytes Absolute: 0.5 K/uL (ref 0.0–1.3)
Neutrophils %: 56.6 %
Neutrophils Absolute: 5.2 K/uL (ref 1.7–7.7)
Platelets: 280 K/uL (ref 135–450)
RBC: 4.72 M/uL (ref 4.00–5.20)
RDW: 13.1 % (ref 12.4–15.4)
WBC: 9.3 K/uL (ref 4.0–11.0)

## 2024-05-24 LAB — LIPID PANEL
Cholesterol, Total: 209 mg/dL — ABNORMAL HIGH (ref 0–199)
HDL: 51 mg/dL (ref 40–60)
LDL Cholesterol: 134 mg/dL — ABNORMAL HIGH (ref <100)
Triglycerides: 120 mg/dL (ref 0–150)
VLDL Cholesterol Calculated: 24 mg/dL

## 2024-05-24 LAB — HEPATIC FUNCTION PANEL
ALT: 39 U/L (ref 10–40)
AST: 30 U/L (ref 15–37)
Albumin: 4.4 g/dL (ref 3.4–5.0)
Alkaline Phosphatase: 119 U/L (ref 40–129)
Bilirubin, Direct: <0.1 mg/dL (ref 0.0–0.3)
Bilirubin, Indirect: 0.2 mg/dL (ref 0.0–1.0)
Total Bilirubin: 0.3 mg/dL (ref 0.0–1.0)
Total Protein: 7.6 g/dL (ref 6.4–8.2)

## 2024-05-24 LAB — BASIC METABOLIC PANEL
Anion Gap: 10 (ref 3–16)
BUN: 15 mg/dL (ref 7–20)
CO2: 26 mmol/L (ref 21–32)
Calcium: 9.2 mg/dL (ref 8.3–10.6)
Chloride: 104 mmol/L (ref 99–110)
Creatinine: 0.8 mg/dL (ref 0.6–1.1)
Est, Glom Filt Rate: 89
Glucose: 133 mg/dL — ABNORMAL HIGH (ref 70–99)
Potassium: 4.4 mmol/L (ref 3.5–5.1)
Sodium: 140 mmol/L (ref 136–145)

## 2024-05-24 LAB — HEMOGLOBIN A1C
Estimated Avg Glucose: 145.6 mg/dL
Hemoglobin A1C: 6.7 %

## 2024-05-24 LAB — ALBUMIN/CREATININE RATIO, URINE
Albumin Urine: 2.24 mg/dL — ABNORMAL HIGH (ref <2.0)
Albumin/Creatinine Ratio: 8.1 mg/g (ref 0.0–30.0)
Creatinine, Ur: 277 mg/dL — ABNORMAL HIGH (ref 28.0–259.0)

## 2024-05-24 NOTE — Addendum Note (Signed)
"  Addended by: Bannie Lobban P on: 05/24/2024 02:02 PM     Modules accepted: Level of Service    "

## 2024-05-31 MED ORDER — EZETIMIBE 10 MG PO TABS
10 | ORAL_TABLET | Freq: Every day | ORAL | 1 refills | Status: AC
Start: 2024-05-31 — End: ?

## 2024-05-31 NOTE — Patient Instructions (Signed)
 " AREA PRESCRIPTION ASSISTANCE RESOURCES*  (Call United Way/211 if need more resources)          United Way 211               The [address] is your gateway to essential community services--available 24 hours a day, 7 days a week. Our trained team listens, asks questions, and connects you to local resources like food, housing, transportation, utilities, childcare, and more. For the most complete and up-to-date information, call 211 or search our resource database online.  www.uwgc211.org for resources in [place], [place], [place], [place], [place], [place], and [place] in [place] ; [place], [place], [place], and [place] counties in  .  www.dayton-unitedway.org/resources for resources in [place], [place], [place], [place], [place], [place], [place], [place], and [place] counties in [place] .  Residents outside these regions can visit 211.org to find their local service.      [facility]  What they offer: Charitable medication distributor that provides access to free medications donated by pharmaceutical companies for uninsured persons living below 300% of the federal poverty level  Contact: [facility]: [phone]; [facility]: [phone]; [facility]: [phone]; [facility]: [phone]      [facility]  What they offer: Provides pharmacy services to assist individuals in obtaining prescription medication, health screenings, and seasonal immunizations when available. Three locations are available in [place].  Website: mysteryfoods.com.br   Phone number: [phone] for appointments      Good Rx   What they offer:  Good Rx tracks prescription drug prices and provides free drug coupons for discounts on medications.   Website: https://www.goodrx.com/    NeedyMeds  What they offer:   Provides clearinghouse of programs and services that help with affordability of medication and health care costs, such  as prescription coupons/discounts, prescription assistance programs, and free/low-cost/sliding scale health care clinics through a website and phone helpline.  Website: https://www.needymeds.org/  Helpline: (252) 614-7527    RX Assist  What they offer:  Information about free and low-cost medicine programs.    Website: https://www.clark.net/    [facility] $4 Prescription Program  What they offer: Prescription Program includes up to a 30-day supply for $4 and a 90-day supply for $10 of some covered generic drugs at commonly prescribed dosages    Website: https://jacobson-moore.net/    Medicare Extra Help Program  What they offer: Extra Help is a Medicare program to help people with limited income and resources pay Medicare drug coverage (Part D) premiums, deductibles, coinsurance, and other costs  Website: instanttyping.com.pt   Phone Number: [phone]    Singlecare: Provides prescription drug discount cards.   Phone: [phone]   Website: www.singlecare.com      Rx Outreach: Provides discounted prescription and over-the-counter medications. Orders are delivered by mail to a provider's office or patient's home. Enrollment over the phone, online, or by mail required.   Phone: [phone]   Website: www.rxoutreach.[address] Area Utility - Architect*  (Call United Way/211 if need more resources.)       United Way 211   The [address] is your gateway to essential community services--available 24 hours a day, 7 days a week. Our trained team listens, asks questions, and connects you to local resources like food, housing, transportation, utilities, childcare, and more. For the most complete and up-to-date information, call 211 or search our resource database online.  www.uwgc211.org for resources in [place], [place], [place], [place], [place], [place], and [place] in  [place] ; [place], [place], and [place] counties in  [place] .  www.dayton-unitedway.org/resources for resources in [place], [place], [place], [place], [place], [place], [place], [place], and [place] counties in [place] .  Residents outside these regions can visit 211.org to find their local service.      Utility and Rent Assistance:    [facility]  What they offer:  Food, rent and utility assistance     Phone Number: [phone]  Address: [address] [place], [place] [address]   Website: https://mcknight.com/    [employer]    What they offer: Utility, food, diaper assistance   Phone: [phone]   Website: www.cincy-caa.[employer]   What they offer:  Rent and utility assistance      Phone Number: [phone]  Address: [address], [place], [address]   Website: https://www.cincinnatigoodwill.org     Job and Family Services (HEAP)   What they offer: ?Utility assistance with heating bills        Phone Number: [phone]   Address: [address], [place] [address]    Website: https://www.cincy-caa.org/what-we-do/community-services/utility-assistance-programs/wcp.html     Elden Jerrell everitt Deward  What they offer:  Rent, utility assistance and financial assistance   Phone Number: [phone]  Address: [address] [place], [place] [address]   Website: http://www.wheeler-ochoa.biz/     [facility]  What they offer:  Utility assistance      Phone Number: [phone]  Address: [address], [place], [address]   Website: https://www.salvationarmyusa.org     [facility] Job and [employer]    What they offer: Public benefits for eligible households   Phone: [phone]   Website: freakymates.de     Photographer (SELF)  What they offer:  Food, rent and utility assistance     Phone Number: [phone]  Address: [address] [place], [address]   Website: pokerprotocol.pl       [facility]  What they offer:  Food, rent and utility assistance     Phone Number: [phone]  Address: [address] [place] [address]   Website: nowsavers.co.uk       [employer]  What they offer: Rent and utility assistance     Phone Number:   [place]: [phone]  [place]: [phone]  Address:  [facility]: [address] [place] [address]  [place]: [address] [place] [address]     Website: www.salvationarmyusa.org      [facility] Job and [employer]   What they offer: Public benefits for eligible households   Phone: [phone]   Website: djfs.stocktonmortgagebrokers.pl  [facility]   What they offer:  Food, rent and utility assistance     Phone Number: [phone]  Address: [address] [facility] [address], [place] [facility] [address]   Website: www.cccsi.org     Elden Jerrell de Deward  What they offer:  Rent, utility assistance and financial assistance   Phone Number: [phone]  Address: [address] [place], [place] [address]   Website: http://www.wheeler-ochoa.biz/     [facility]   What they offer:  Food, rent, utility and financial assistance   Phone Number: [phone]  Address: [address] [place], [address]   Website: https://www.salvos.com/[place]/default.htm      [place]   What they offer: Food, rent and utility assistance   Phone: [phone]  Website: www.littlepageant.ch          [facility] Job and [employer]   What they offer: Public benefits for eligible households   Phone: [phone]   Website: autopreserve.it     [facility] Job and [employer]   What they offer: Public benefits for eligible households   Phone: [phone]   Website: bandgroupie.fr    [employer] [place]  What they offer:  Rent and utility assistance     Phone Number: [phone]  Address: [address] [place] [address]    Website: http://www.w3cu.com/     [facility] Job and [employer]   What they offer: Public benefits for eligible households    Phone: [phone]   Website: co.clinton.oh.us /departments/jobandfamilyservices    [facility] Action  What they offer:  Food, rent and utility assistance     Phone Number: [phone]  Address: [address] [place], [place] [address]   Website: lauderdaleestates.be       [facility] JFS   What they offer:  Rent and utility assistance and [place]  HomeCare waiver    Phone Number: [phone]  Address: [address], [place] [address]   Website: https://www.co.warren.oh.us /      [facility] Action  What they offer:  Rent and utility assistance and [place]  HomeCare waiver     Phone Number: [phone]  Address: [address] [place] [address]   Website: Http://www.mason.info/       [facility] Financial Assistance  What they offer: Financial assistance programs that are designed to assist you in finding resources that may help pay your hospital bill. Please click on the links below to learn more about the financial assistance programs available within our regions.  Phone Number: [phone]  How to apply for the [facility] Financial Assistance Program:       Option 1: To apply for financial assistance, a patient (or their family or other provider) should fill out the Software Engineer. Copies of the Financial Assistance Application and the FAP may be obtained for free by calling the [facility] Customer Service department at [phone]   Option 2: The Software Engineer and policy may be obtained for free by downloading a copy from the [facility] website:  https://www.[place].com/patient-resources/financial-assistance  [place]  Health Care Assurance Program  What they offer:  Patients who need hospital care, but are unable to pay for it, may  be eligible for free or reduced fee care at [place]  hospitals through the [facility] Care Assurance Program (HCAP). Applications for HCAP are accepted by the hospital where care was received, and patients seeking HCAP assistance should contact their hospital's billing department for application instructions  Website: http://baker.com/      Emergency Rental Assistance  Coalition on Homelessness and Housing in [place] : Cite provides list of Community Resources that assist with emergency rental assistance for all counties in [place]   podexchange.nl            How to apply for Medicaid or Health Insurance  Online:  Apply online at: https://benefits.[place] .gov/    Phone:  By calling your area Job and Family Services: [phone]  https://jfs.[place] .gov/about/local-agencies-directory/local-agencies-directory    Mail or drop off a paper application to your local Department of Social Services.  Applications can be downloaded and printed here: https://benefits.[place] .gov/   Mailing may take longer than other methods of applying.   Find your nearest local Department of Social Services by visiting https://jfs.[place] .gov/County/County_Directory.stm  Online:  Apply at [employer]: www.healthcare.gov    Please contact your local job and family services or check online to get updates on the status of your application. Your local DJFS will not contact you with updates.      [place]  Senior Directv Information Program [place])  What they offer: The department's [place]  Senior Health Insurance Information Program (OSHIIP) provides Medicare beneficiaries with free, objective health insurance information, one-on-one counseling, and educates consumers about Medicare, Medicare prescription drug coverage (Part D), Medicare Advantage options, Medicare supplement insurance.  Website:  https://www.shiphelp.org/about-medicare/regional-ship-location/[place]    Phone Number: [phone]    [place]  Department of Insurance  What  they offer: Provide consumer protection through education and fair but vigilant regulation while promoting a stable and competitive environment for insurers  Website:  https://insurance.[place] .gov/   Phone Number: [phone]  Contact your Area Agency on Aging for information regarding waiver services and Medicaid based assistance for seniors and those with complex medical conditions.      Area Agencies on Aging (AAA)  Council on Aging:   Counties Served: [place], [place], [place], [place], [place]  Address: [address], [place], [address] / Phone: [phone]  Supports Offered  Passport  Elderly Services Program (ESP)  MyCare [place]    Specialized Recovery Services   Assisted Living Waiver    Area on Aging District 7 (AAA7)  Counties Covered: [place], [place], [place], [place], [place], [place], [place], [place], [place], and [place]  Address: [address] [place], [place] [address] / Phone: [phone]  Supports Offered:   Passport  Assisted Living Waiver  Consumer Directed Option Providers      [place] Area Utility - Financial Resources*  (Call United Way/211 if need more resources.)       United Way 211   The United Way 211 Helpline is your gateway to essential community services--available 24 hours a day, 7 days a week. Our trained team listens, asks questions, and connects you to local resources like food, housing, transportation, utilities, childcare, and more. For the most complete and up-to-date information, call 211 or search our resource database online.  www.uwgc211.org for resources in [place], [place], [place], [place], [place], [place], and [place] in [place] ; [place], [place], and [place] counties in Insurance Underwriter .  www.dayton-unitedway.org/resources for resources in [place], [place], [place], [place], [place], [place], [place], [place], and [place] counties in  .  Residents outside these regions can visit 211.org to find their local service.      Utility and Rent Assistance:    [facility]  Lott  What they offer:  Food, rent and utility assistance     Phone Number: [phone]  Address: [address] [place], [place] [address]   Website: https://mcknight.com/    [employer]    What they offer: Utility, food, diaper assistance   Phone: [phone]   Website: www.cincy-caa.[employer]   What they offer:  Rent and utility assistance      Phone Number: [phone]  Address: [address] [place], [address]   Website: https://www.cincinnatigoodwill.org     Job and Family Services (HEAP)   What they offer: ?Utility assistance with heating bills        Phone Number: [phone]   Address: [address] [place] [address]    Website: https://www.cincy-caa.org/what-we-do/community-services/utility-assistance-programs/wcp.html     Elden Jerrell everitt Deward  What they offer:  Rent, utility assistance and financial assistance   Phone Number: [phone]  Address: [address] [place], [place] [address]   Website: http://www.wheeler-ochoa.biz/  [employer]  What they offer:  Utility assistance      Phone Number: [phone]  Address: [address] [place], [place], [address]   Website: https://www.salvationarmyusa.org     [facility] Job and [employer]    What they offer: Public benefits for eligible households   Phone: [phone]   Website: freakymates.de     Photographer (SELF)  What they offer:  Food, rent and utility assistance     Phone Number: [phone]  Address: [address] [place], [address]   Website: pokerprotocol.pl       [facility]  What they offer:  Food, rent and utility assistance     Phone Number: [phone]  Address: [address] [place] [address]   Website: nowsavers.co.uk       [employer]  What they offer: Rent and utility assistance     Phone Number:    [place]: [phone]  [place]: [phone]  Address:  [facility]: [address] [place] [address]  [place]: [address] [place] [address]     Website: www.salvationarmyusa.org      [facility] Job and [employer]   What they offer: Public benefits for eligible households   Phone: [phone]   Website: djfs.stocktonmortgagebrokers.pl  [facility]   What they offer:  Food, rent and utility assistance     Phone Number: [phone]  Address: [facility] [address], [place] [facility] [address]   Website: www.cccsi.org     Elden Specking de Deward  What they offer:  Rent, utility assistance and financial assistance   Phone Number: [phone]  Address: [address], [place], [place] [address]   Website: http://www.wheeler-ochoa.biz/     [facility] [place]   What they offer:  Food, rent, utility and financial assistance   Phone Number: [phone]  Address: [address] [place], [address]   Website: https://www.salvos.com/[place]/default.htm      [place] [facility] [place]   What they offer: Food, rent and utility assistance   Phone: [phone]   Website: www.littlepageant.ch          [facility] Job and [employer]   What they offer: Public benefits for eligible households   Phone: [phone]   Website: autopreserve.it     [facility] Job and [employer]   What they offer: Public benefits for eligible households   Phone: [phone]   Website: bandgroupie.fr    [employer] [place]  What they offer:  Rent and utility assistance     Phone Number: [phone]  Address: [address], [place] [address]    Website: http://www.w3cu.com/     [facility] Job and [employer]   What they offer: Public benefits for eligible households    Phone: [phone]   Website: co.clinton.oh.us /departments/jobandfamilyservices    [facility] Action  What  they offer:  Food, rent and utility assistance     Phone Number: [phone]  Address: [address] [place], [place] [address]   Website: lauderdaleestates.be       [facility] JFS   What they offer:  Rent and utility assistance and [place]  HomeCare waiver    Phone Number: [phone]  Address: [address], [place] [address]   Website: https://www.co.warren.oh.us /      [facility] Action  What they offer:  Rent and  utility assistance and [place]  HomeCare waiver     Phone Number: [phone]  Address: [address] [place] [address]   Website: Http://www.mason.info/       [facility] Financial Assistance  What they offer: Financial assistance programs that are designed to assist you in finding resources that may help pay your hospital bill. Please click on the links below to learn more about the financial assistance programs available within our regions.  Phone Number: [phone]  How to apply for the [facility] Financial Assistance Program:       Option 1: To apply for financial assistance, a patient (or their family or other provider) should fill out the Software Engineer. Copies of the Financial Assistance Application and the FAP may be obtained for free by calling the [facility] Customer Service department at [phone]   Option 2: The Software Engineer and policy may be obtained for free by downloading a copy from the [facility] website:  https://www.[place].com/patient-resources/financial-assistance  [place]  Health Care Assurance Program  What they offer:  Patients who need hospital care, but are unable to pay for it, may be eligible for free or reduced fee care at [place]  hospitals through the [facility] Care Assurance Program (HCAP). Applications for HCAP are accepted by the hospital where care was received, and patients seeking HCAP assistance should contact their hospital's billing department for application  instructions  Website: http://baker.com/      Emergency Rental Assistance  Coalition on Homelessness and Housing in [place] : Cite provides list of Community Resources that assist with emergency rental assistance for all counties in [place]   podexchange.nl            How to apply for Medicaid or Health Insurance  Online:  Apply online at: https://benefits.[place] .gov/    Phone:  By calling your area Job and Family Services: [phone]  https://jfs.[place] .gov/about/local-agencies-directory/local-agencies-directory    Mail or drop off a paper application to your local Department of Social Services.  Applications can be downloaded and printed here: https://benefits.[place] .gov/   Mailing may take longer than other methods of applying.   Find your nearest local Department of Social Services by visiting https://jfs.[place] .gov/County/County_Directory.stm     Online:  Apply at [employer]: www.healthcare.gov    Please contact your local job and family services or check online to get updates on the status of your application. Your local DJFS will not contact you with updates.      [place]  Senior Directv Information Program [place])  What they offer: The department's [place]  Senior Health Insurance Information Program (OSHIIP) provides Medicare beneficiaries with free, objective health insurance information, one-on-one counseling, and educates consumers about Medicare, Medicare prescription drug coverage (Part D), Medicare Advantage options, Medicare supplement insurance.  Website:  https://www.shiphelp.org/about-medicare/regional-ship-location/[place]    Phone Number: [phone]    [place]  Department of Insurance  What they offer: Provide consumer protection through education and fair but vigilant regulation while promoting a stable and competitive environment for insurers  Website:  https://insurance.[place] .gov/   Phone Number: [phone]  Contact your Area Agency on Aging for information regarding  waiver services and Medicaid based assistance for seniors and those with complex medical conditions.      Area Agencies on Aging (AAA)  Council on Aging:   Counties Served: [place], [place], [place], [place], [place]  Address: [address] [place], [address] / Phone: [phone]  Supports Offered  Passport  Elderly Services Program (ESP)  MyCare [place]    Specialized Recovery Services   Assisted Living Waiver    Area  on [place] 7 (AAA7)  Counties Covered: [place], [place], [place], [place], [place], [place], [place], [place], and [place]  Address: [address] [place], [place] [address] / Phone: [phone]  Supports Offered:   Passport  Assisted Living Waiver  Consumer Directed Option Providers    "
